# Patient Record
Sex: Male | Born: 1975 | Race: White | Hispanic: No | Marital: Married | Smoking: Former smoker
Health system: Southern US, Community
[De-identification: ages and names within clinical notes are randomized; demographics above are authoritative.]

## PROBLEM LIST (undated history)

## (undated) DIAGNOSIS — F419 Anxiety disorder, unspecified: Secondary | ICD-10-CM

## (undated) DIAGNOSIS — R739 Hyperglycemia, unspecified: Secondary | ICD-10-CM

## (undated) DIAGNOSIS — F319 Bipolar disorder, unspecified: Secondary | ICD-10-CM

## (undated) DIAGNOSIS — E559 Vitamin D deficiency, unspecified: Secondary | ICD-10-CM

## (undated) DIAGNOSIS — Z8619 Personal history of other infectious and parasitic diseases: Secondary | ICD-10-CM

## (undated) DIAGNOSIS — K219 Gastro-esophageal reflux disease without esophagitis: Secondary | ICD-10-CM

## (undated) DIAGNOSIS — E538 Deficiency of other specified B group vitamins: Secondary | ICD-10-CM

## (undated) DIAGNOSIS — D72820 Lymphocytosis (symptomatic): Secondary | ICD-10-CM

## (undated) DIAGNOSIS — R7989 Other specified abnormal findings of blood chemistry: Secondary | ICD-10-CM

## (undated) HISTORY — DX: Vitamin D deficiency, unspecified: E55.9

## (undated) HISTORY — DX: Anxiety disorder, unspecified: F41.9

## (undated) HISTORY — DX: Lymphocytosis (symptomatic): D72.820

## (undated) HISTORY — DX: Deficiency of other specified B group vitamins: E53.8

## (undated) HISTORY — DX: Gastro-esophageal reflux disease without esophagitis: K21.9

## (undated) HISTORY — PX: VASECTOMY: SHX75

## (undated) HISTORY — DX: Bipolar disorder, unspecified: F31.9

## (undated) HISTORY — DX: Hyperglycemia, unspecified: R73.9

## (undated) HISTORY — DX: Personal history of other infectious and parasitic diseases: Z86.19

## (undated) HISTORY — DX: Other specified abnormal findings of blood chemistry: R79.89

---

## 2008-08-24 ENCOUNTER — Emergency Department: Payer: Self-pay | Admitting: Emergency Medicine

## 2011-10-07 ENCOUNTER — Emergency Department: Payer: Self-pay | Admitting: Emergency Medicine

## 2015-06-02 ENCOUNTER — Encounter: Payer: Self-pay | Admitting: Family Medicine

## 2015-06-02 ENCOUNTER — Ambulatory Visit (INDEPENDENT_AMBULATORY_CARE_PROVIDER_SITE_OTHER): Payer: BLUE CROSS/BLUE SHIELD | Admitting: Family Medicine

## 2015-06-02 VITALS — BP 131/81 | HR 97 | Temp 98.6°F | Ht 70.2 in | Wt 232.0 lb

## 2015-06-02 DIAGNOSIS — E559 Vitamin D deficiency, unspecified: Secondary | ICD-10-CM | POA: Insufficient documentation

## 2015-06-02 DIAGNOSIS — M10071 Idiopathic gout, right ankle and foot: Secondary | ICD-10-CM | POA: Diagnosis not present

## 2015-06-02 DIAGNOSIS — M109 Gout, unspecified: Secondary | ICD-10-CM | POA: Insufficient documentation

## 2015-06-02 DIAGNOSIS — Z23 Encounter for immunization: Secondary | ICD-10-CM

## 2015-06-02 MED ORDER — ALLOPURINOL 300 MG PO TABS: 300.0000 mg | ORAL_TABLET | Freq: Every day | ORAL | Status: DC

## 2015-06-02 MED ORDER — COLCHICINE 0.6 MG PO TABS: 0.6000 mg | ORAL_TABLET | Freq: Every day | ORAL | Status: DC

## 2015-06-02 NOTE — Progress Notes (Signed)
BP 131/81 mmHg  Pulse 97  Temp(Src) 98.6 F (37 C)  Ht 5' 10.2" (1.783 m)  Wt 232 lb (105.235 kg)  BMI 33.10 kg/m2  SpO2 97%   Subjective:    Patient ID: Rodney Peters, male    DOB: 07-06-1975, 40 y.o.   MRN: [id number]  HPI: [name] is a 40 y.o. male  Chief Complaint  Patient presents with  . Foot Pain    right  . URI    x 10days   Patient with gout symptoms right great toe has been intermittently over the last several years now with intense pain very sensitive to very light touch red and inflamed Has been reading about gout has not taken any medication except for occasional Advil with practically no relief  Patient also with some head cold congestion started more as viral now stable and off lingering cough no real congestion drainage  Relevant past medical, surgical, family and social history reviewed and updated as indicated. Interim medical history since our last visit reviewed. Allergies and medications reviewed and updated.  Review of Systems  Constitutional: Negative.   Respiratory: Negative.   Cardiovascular: Negative.     Per HPI unless specifically indicated above     Objective:    BP 131/81 mmHg  Pulse 97  Temp(Src) 98.6 F (37 C)  Ht 5' 10.2" (1.783 m)  Wt 232 lb (105.235 kg)  BMI 33.10 kg/m2  SpO2 97%  Wt Readings from Last 3 Encounters:  06/02/15 232 lb (105.235 kg)  03/18/14 252 lb (114.306 kg)    Physical Exam  Constitutional: He is oriented to person, place, and time. He appears well-developed and well-nourished. No distress.  HENT:  Head: Normocephalic and atraumatic.  Right Ear: Hearing normal.  Left Ear: Hearing normal.  Nose: Nose normal.  Eyes: Conjunctivae and lids are normal. Right eye exhibits no discharge. Left eye exhibits no discharge. No scleral icterus.  Cardiovascular: Normal rate, regular rhythm and normal heart sounds.   Pulmonary/Chest: Effort normal and breath sounds normal. No respiratory distress.   Musculoskeletal: Normal range of motion.  Neurological: He is alert and oriented to person, place, and time.  Skin: Skin is intact. No rash noted.  Inflamed right red swollen bunion  Psychiatric: He has a normal mood and affect. His speech is normal and behavior is normal. Judgment and thought content normal. Cognition and memory are normal.    No results found for this or any previous visit.    Assessment & Plan:   Problem List Items Addressed This Visit      Other   Gout    Discuss gout care and treatment use of colchicine waiting 2 weeks starting allopurinol Discussed taking colchicine for 2 months allopurinol long-term Recheck uric acid and gout treatment at physical this spring      Relevant Medications   allopurinol (ZYLOPRIM) 300 MG tablet   colchicine 0.6 MG tablet   Other Relevant Orders   Uric acid   Basic metabolic panel   Vitamin D deficiency    Patient takes prescription replacement and is been on vitamins for over a year now wondering how his vitamin D is doing feels good otherwise      Relevant Orders   VITAMIN D 25 Hydroxy (Vit-D Deficiency, Fractures)    Other Visit Diagnoses    Immunization due    -  Primary    Relevant Orders    Flu Vaccine QUAD 36+ mos PF IM (Fluarix & Fluzone  Quad PF) (Completed)        Follow up plan: Return for Physical Exam this spring and check uric acid.

## 2015-06-02 NOTE — Assessment & Plan Note (Signed)
Discuss gout care and treatment use of colchicine waiting 2 weeks starting allopurinol Discussed taking colchicine for 2 months allopurinol long-term Recheck uric acid and gout treatment at physical this spring

## 2015-06-02 NOTE — Assessment & Plan Note (Signed)
Patient takes prescription replacement and is been on vitamins for over a year now wondering how his vitamin D is doing feels good otherwise

## 2015-06-03 LAB — BASIC METABOLIC PANEL
BUN/Creatinine Ratio: 11 (ref 8–19)
BUN: 10 mg/dL (ref 6–20)
CO2: 25 mmol/L (ref 18–29)
Calcium: 9.9 mg/dL (ref 8.7–10.2)
Chloride: 99 mmol/L (ref 96–106)
Creatinine, Ser: 0.89 mg/dL (ref 0.76–1.27)
GFR calc Af Amer: 125 mL/min/{1.73_m2} (ref >59)
GFR calc non Af Amer: 108 mL/min/{1.73_m2} (ref >59)
Glucose: 80 mg/dL (ref 65–99)
Potassium: 3.9 mmol/L (ref 3.5–5.2)
Sodium: 142 mmol/L (ref 134–144)

## 2015-06-03 LAB — URIC ACID: Uric Acid: 7.9 mg/dL (ref 3.7–8.6)

## 2015-06-03 LAB — VITAMIN D 25 HYDROXY (VIT D DEFICIENCY, FRACTURES): Vit D, 25-Hydroxy: 19.6 ng/mL — ABNORMAL LOW (ref 30.0–100.0)

## 2015-06-08 ENCOUNTER — Telehealth: Payer: Self-pay | Admitting: Family Medicine

## 2015-06-08 MED ORDER — AZITHROMYCIN 250 MG PO TABS: ORAL_TABLET | ORAL | Status: DC

## 2015-06-08 NOTE — Telephone Encounter (Signed)
Patient wife called stating that his cold and cough has not got better, per Dr. Jeananne Rama to call back if worse. Patient wants to know if he can get another medication or antibiotic for it. Pharmacy is [facility] in [place].

## 2015-07-31 ENCOUNTER — Encounter: Payer: Self-pay | Admitting: Unknown Physician Specialty

## 2015-07-31 ENCOUNTER — Ambulatory Visit (INDEPENDENT_AMBULATORY_CARE_PROVIDER_SITE_OTHER): Payer: BLUE CROSS/BLUE SHIELD | Admitting: Unknown Physician Specialty

## 2015-07-31 VITALS — BP 127/87 | HR 103 | Temp 98.5°F | Ht 70.5 in | Wt 233.6 lb

## 2015-07-31 DIAGNOSIS — J069 Acute upper respiratory infection, unspecified: Secondary | ICD-10-CM | POA: Diagnosis not present

## 2015-07-31 DIAGNOSIS — J029 Acute pharyngitis, unspecified: Secondary | ICD-10-CM

## 2015-07-31 NOTE — Patient Instructions (Signed)

## 2015-07-31 NOTE — Progress Notes (Signed)
BP 127/87 mmHg  Pulse 103  Temp(Src) 98.5 F (36.9 C)  Ht 5' 10.5" (1.791 m)  Wt 233 lb 9.6 oz (105.96 kg)  BMI 33.03 kg/m2  SpO2 98%   Subjective:    Patient ID: Rodney Peters, male    DOB: 1975-07-10, 40 y.o.   MRN: [id number]  HPI: Rodney Peters is a 40 y.o. male  Chief Complaint  Patient presents with  . Sore Throat    pt state he has white spots on back of throat  . URI    pt states he has had runny nose, headache, and congestion since yesterday   URI  This is a new problem. The current episode started in the past 7 days. The problem has been rapidly worsening. There has been no fever. Associated symptoms include congestion, rhinorrhea and a sore throat. Pertinent negatives include no abdominal pain, chest pain, coughing, diarrhea, dysuria, ear pain, headaches, joint pain, joint swelling, nausea, neck pain, plugged ear sensation, rash, sinus pain, sneezing, swollen glands, vomiting or wheezing. He has tried nothing for the symptoms.     Relevant past medical, surgical, family and social history reviewed and updated as indicated. Interim medical history since our last visit reviewed. Allergies and medications reviewed and updated.  Review of Systems  HENT: Positive for congestion, rhinorrhea and sore throat. Negative for ear pain and sneezing.   Respiratory: Negative for cough and wheezing.   Cardiovascular: Negative for chest pain.  Gastrointestinal: Negative for nausea, vomiting, abdominal pain and diarrhea.  Genitourinary: Negative for dysuria.  Musculoskeletal: Negative for joint pain and neck pain.  Skin: Negative for rash.  Neurological: Negative for headaches.    Per HPI unless specifically indicated above     Objective:    BP 127/87 mmHg  Pulse 103  Temp(Src) 98.5 F (36.9 C)  Ht 5' 10.5" (1.791 m)  Wt 233 lb 9.6 oz (105.96 kg)  BMI 33.03 kg/m2  SpO2 98%  Wt Readings from Last 3 Encounters:  07/31/15 233 lb 9.6 oz (105.96 kg)  06/02/15 232  lb (105.235 kg)  03/18/14 252 lb (114.306 kg)    Physical Exam  Results for orders placed or performed in visit on 06/02/15  Uric acid  Result Value Ref Range   Uric Acid 7.9 3.7 - 8.6 mg/dL  Basic metabolic panel  Result Value Ref Range   Glucose 80 65 - 99 mg/dL   BUN 10 6 - 20 mg/dL   Creatinine, Ser 0.89 0.76 - 1.27 mg/dL   GFR calc non Af Amer 108 >59 mL/min/1.73   GFR calc Af Amer 125 >59 mL/min/1.73   BUN/Creatinine Ratio 11 8 - 19   Sodium 142 134 - 144 mmol/L   Potassium 3.9 3.5 - 5.2 mmol/L   Chloride 99 96 - 106 mmol/L   CO2 25 18 - 29 mmol/L   Calcium 9.9 8.7 - 10.2 mg/dL  VITAMIN D 25 Hydroxy (Vit-D Deficiency, Fractures)  Result Value Ref Range   Vit D, 25-Hydroxy 19.6 (L) 30.0 - 100.0 ng/mL      Assessment & Plan:   Problem List Items Addressed This Visit    None    Visit Diagnoses    Sore throat    -  Primary    Relevant Orders    Rapid strep screen (not at [facility])    URI (upper respiratory infection)            Follow up plan: Return if symptoms worsen or fail  to improve.

## 2015-08-02 LAB — RAPID STREP SCREEN (MED CTR MEBANE ONLY): Strep Gp A Ag, IA W/Reflex: NEGATIVE

## 2015-08-02 LAB — CULTURE, GROUP A STREP: Strep A Culture: POSITIVE — AB

## 2015-08-03 ENCOUNTER — Telehealth: Payer: Self-pay | Admitting: Unknown Physician Specialty

## 2015-08-03 MED ORDER — AMOXICILLIN 875 MG PO TABS: 875.0000 mg | ORAL_TABLET | Freq: Two times a day (BID) | ORAL | Status: DC

## 2015-08-03 NOTE — Telephone Encounter (Signed)
Called pt to let him know throat culture is positive.  Called in Amoxicilin

## 2015-08-06 ENCOUNTER — Telehealth: Payer: Self-pay

## 2015-08-06 DIAGNOSIS — M10071 Idiopathic gout, right ankle and foot: Secondary | ICD-10-CM

## 2015-08-06 MED ORDER — ALLOPURINOL 300 MG PO TABS: 300.0000 mg | ORAL_TABLET | Freq: Every day | ORAL | Status: DC

## 2015-08-06 NOTE — Telephone Encounter (Signed)
Pharmacy sent a fax requesting 90 day rx for patient's allopurinol. We sent in #[phone] with 6 refills on 06/02/15.

## 2015-08-13 ENCOUNTER — Encounter: Payer: Self-pay | Admitting: Family Medicine

## 2015-08-13 ENCOUNTER — Ambulatory Visit (INDEPENDENT_AMBULATORY_CARE_PROVIDER_SITE_OTHER): Payer: BLUE CROSS/BLUE SHIELD | Admitting: Family Medicine

## 2015-08-13 VITALS — BP 126/82 | HR 92 | Temp 98.4°F | Ht 70.2 in | Wt 231.0 lb

## 2015-08-13 DIAGNOSIS — Z0000 Encounter for general adult medical examination without abnormal findings: Secondary | ICD-10-CM

## 2015-08-13 DIAGNOSIS — F3181 Bipolar II disorder: Secondary | ICD-10-CM

## 2015-08-13 DIAGNOSIS — G5790 Unspecified mononeuropathy of unspecified lower limb: Secondary | ICD-10-CM | POA: Insufficient documentation

## 2015-08-13 DIAGNOSIS — M10071 Idiopathic gout, right ankle and foot: Secondary | ICD-10-CM

## 2015-08-13 DIAGNOSIS — G5792 Unspecified mononeuropathy of left lower limb: Secondary | ICD-10-CM

## 2015-08-13 LAB — URINALYSIS, ROUTINE W REFLEX MICROSCOPIC
Bilirubin, UA: NEGATIVE
Glucose, UA: NEGATIVE
Ketones, UA: NEGATIVE
Leukocytes, UA: NEGATIVE
Nitrite, UA: NEGATIVE
Protein, UA: NEGATIVE
RBC, UA: NEGATIVE
Specific Gravity, UA: 1.02 (ref 1.005–1.030)
Urobilinogen, Ur: 0.2 mg/dL (ref 0.2–1.0)
pH, UA: 5 (ref 5.0–7.5)

## 2015-08-13 MED ORDER — ALLOPURINOL 300 MG PO TABS: 300.0000 mg | ORAL_TABLET | Freq: Every day | ORAL | Status: DC

## 2015-08-13 MED ORDER — LAMOTRIGINE 25 MG PO TABS: 25.0000 mg | ORAL_TABLET | Freq: Every day | ORAL | Status: DC

## 2015-08-13 MED ORDER — LAMOTRIGINE 200 MG PO TABS: 200.0000 mg | ORAL_TABLET | Freq: Every day | ORAL | Status: DC

## 2015-08-13 NOTE — Assessment & Plan Note (Signed)
The current medical regimen is effective;  continue present plan and medications.  
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
## 2015-08-13 NOTE — Addendum Note (Signed)
Addended by: Rowe Clack H on: 08/13/2015 11:41 AM   Modules accepted: Miquel Dunn

## 2015-08-13 NOTE — Assessment & Plan Note (Signed)
Restart lamictal reviewed long term nature of tx Discuss rash and slow increase of meds

## 2015-08-14 LAB — TSH: TSH: 1.76 u[IU]/mL (ref 0.450–4.500)

## 2015-08-14 LAB — CBC WITH DIFFERENTIAL/PLATELET
Basophils Absolute: 0.1 10*3/uL (ref 0.0–0.2)
Basos: 1 %
EOS (ABSOLUTE): 0.5 10*3/uL — ABNORMAL HIGH (ref 0.0–0.4)
Eos: 5 %
Hematocrit: 44.3 % (ref 37.5–51.0)
Hemoglobin: 15.3 g/dL (ref 12.6–17.7)
Immature Grans (Abs): 0 10*3/uL (ref 0.0–0.1)
Immature Granulocytes: 0 %
Lymphocytes Absolute: 4.9 10*3/uL — ABNORMAL HIGH (ref 0.7–3.1)
Lymphs: 44 %
MCH: 29.4 pg (ref 26.6–33.0)
MCHC: 34.5 g/dL (ref 31.5–35.7)
MCV: 85 fL (ref 79–97)
Monocytes Absolute: 0.4 10*3/uL (ref 0.1–0.9)
Monocytes: 4 %
Neutrophils Absolute: 5.2 10*3/uL (ref 1.4–7.0)
Neutrophils: 46 %
Platelets: 242 10*3/uL (ref 150–379)
RBC: 5.21 x10E6/uL (ref 4.14–5.80)
RDW: 13.2 % (ref 12.3–15.4)
WBC: 11.1 10*3/uL — ABNORMAL HIGH (ref 3.4–10.8)

## 2015-08-14 LAB — COMPREHENSIVE METABOLIC PANEL
ALT: 28 IU/L (ref 0–44)
AST: 24 IU/L (ref 0–40)
Albumin/Globulin Ratio: 1.7 (ref 1.2–2.2)
Albumin: 4.5 g/dL (ref 3.5–5.5)
Alkaline Phosphatase: 48 IU/L (ref 39–117)
BUN/Creatinine Ratio: 12 (ref 9–20)
BUN: 13 mg/dL (ref 6–20)
Bilirubin Total: 0.6 mg/dL (ref 0.0–1.2)
CO2: 23 mmol/L (ref 18–29)
Calcium: 9.6 mg/dL (ref 8.7–10.2)
Chloride: 97 mmol/L (ref 96–106)
Creatinine, Ser: 1.06 mg/dL (ref 0.76–1.27)
GFR calc Af Amer: 102 mL/min/{1.73_m2} (ref >59)
GFR calc non Af Amer: 88 mL/min/{1.73_m2} (ref >59)
Globulin, Total: 2.6 g/dL (ref 1.5–4.5)
Glucose: 97 mg/dL (ref 65–99)
Potassium: 3.9 mmol/L (ref 3.5–5.2)
Sodium: 141 mmol/L (ref 134–144)
Total Protein: 7.1 g/dL (ref 6.0–8.5)

## 2015-08-14 LAB — LIPID PANEL W/O CHOL/HDL RATIO
Cholesterol, Total: 204 mg/dL — ABNORMAL HIGH (ref 100–199)
HDL: 36 mg/dL — ABNORMAL LOW (ref >39)
LDL Calculated: 97 mg/dL (ref 0–99)
Triglycerides: 354 mg/dL — ABNORMAL HIGH (ref 0–149)
VLDL Cholesterol Cal: 71 mg/dL — ABNORMAL HIGH (ref 5–40)

## 2015-08-14 LAB — URIC ACID: Uric Acid: 8 mg/dL (ref 3.7–8.6)

## 2015-08-18 ENCOUNTER — Encounter: Payer: Self-pay | Admitting: Family Medicine

## 2015-11-11 ENCOUNTER — Encounter: Payer: Self-pay | Admitting: Family Medicine

## 2015-11-11 ENCOUNTER — Ambulatory Visit (INDEPENDENT_AMBULATORY_CARE_PROVIDER_SITE_OTHER): Payer: BLUE CROSS/BLUE SHIELD | Admitting: Family Medicine

## 2015-11-11 DIAGNOSIS — M10071 Idiopathic gout, right ankle and foot: Secondary | ICD-10-CM | POA: Diagnosis not present

## 2015-11-11 DIAGNOSIS — F3181 Bipolar II disorder: Secondary | ICD-10-CM | POA: Diagnosis not present

## 2015-11-11 NOTE — Progress Notes (Signed)
BP 121/78 (BP Location: Left Arm, Patient Position: Sitting, Cuff Size: Normal)   Pulse (!) 106   Temp 98.1 F (36.7 C)   Ht 5\' 11"  (1.803 m) Comment: with shoe  Wt 239 lb (108.4 kg) Comment: with shoes  SpO2 99%   BMI 33.33 kg/m    Subjective:    Patient ID: Rodney Peters, male    DOB: [birth year], 40 y.o.   MRN: [id number]  HPI: Rodney Peters is a 40 y.o. male  Chief Complaint  Patient presents with  . med check  Patient feeling somewhat better reports about 40% improved some of the peaks of been leveled out in some of the valleys have been filled in but still up and down. Patient also reveals having some CDs type symptoms has a lot of checking behaviors. Concerned about [place] whether to [place] or in a sink. Reviewed medication taking medication faithfully without problems Patient had tried stopping his colchicine and gout symptoms promptly recurred has been taking allopurinol without problems since May.  Relevant past medical, surgical, family and social history reviewed and updated as indicated. Interim medical history since our last visit reviewed. Allergies and medications reviewed and updated.  Review of Systems  Constitutional: Negative.   Respiratory: Negative.   Cardiovascular: Negative.     Per HPI unless specifically indicated above     Objective:    BP 121/78 (BP Location: Left Arm, Patient Position: Sitting, Cuff Size: Normal)   Pulse (!) 106   Temp 98.1 F (36.7 C)   Ht 5\' 11"  (1.803 m) Comment: with shoe  Wt 239 lb (108.4 kg) Comment: with shoes  SpO2 99%   BMI 33.33 kg/m   Wt Readings from Last 3 Encounters:  11/11/15 239 lb (108.4 kg)  08/13/15 231 lb (104.8 kg)  07/31/15 233 lb 9.6 oz (106 kg)    Physical Exam  Constitutional: He is oriented to person, place, and time. He appears well-developed and well-nourished. No distress.  HENT:  Head: Normocephalic and atraumatic.  Right Ear: Hearing normal.  Left Ear: Hearing normal.  Nose:  Nose normal.  Eyes: Conjunctivae and lids are normal. Right eye exhibits no discharge. Left eye exhibits no discharge. No scleral icterus.  Cardiovascular: Normal rate, regular rhythm and normal heart sounds.   Pulmonary/Chest: Effort normal and breath sounds normal. No respiratory distress.  Musculoskeletal: Normal range of motion.  Neurological: He is alert and oriented to person, place, and time.  Skin: Skin is intact. No rash noted.  Psychiatric: He has a normal mood and affect. His speech is normal and behavior is normal. Judgment and thought content normal. Cognition and memory are normal.    Results for orders placed or performed in visit on 08/13/15  CBC with Differential/Platelet  Result Value Ref Range   WBC 11.1 (H) 3.4 - 10.8 x10E3/uL   RBC 5.21 4.14 - 5.80 x10E6/uL   Hemoglobin 15.3 12.6 - 17.7 g/dL   Hematocrit 44.3 37.5 - 51.0 %   MCV 85 79 - 97 fL   MCH 29.4 26.6 - 33.0 pg   MCHC 34.5 31.5 - 35.7 g/dL   RDW 13.2 12.3 - 15.4 %   Platelets 242 150 - 379 x10E3/uL   Neutrophils 46 %   Lymphs 44 %   Monocytes 4 %   Eos 5 %   Basos 1 %   Neutrophils Absolute 5.2 1.4 - 7.0 x10E3/uL   Lymphocytes Absolute 4.9 (H) 0.7 - 3.1 x10E3/uL   Monocytes Absolute  0.4 0.1 - 0.9 x10E3/uL   EOS (ABSOLUTE) 0.5 (H) 0.0 - 0.4 x10E3/uL   Basophils Absolute 0.1 0.0 - 0.2 x10E3/uL   Immature Granulocytes 0 %   Immature Grans (Abs) 0.0 0.0 - 0.1 x10E3/uL  Comprehensive metabolic panel  Result Value Ref Range   Glucose 97 65 - 99 mg/dL   BUN 13 6 - 20 mg/dL   Creatinine, Ser 1.06 0.76 - 1.27 mg/dL   GFR calc non Af Amer 88 >59 mL/min/1.73   GFR calc Af Amer 102 >59 mL/min/1.73   BUN/Creatinine Ratio 12 9 - 20   Sodium 141 134 - 144 mmol/L   Potassium 3.9 3.5 - 5.2 mmol/L   Chloride 97 96 - 106 mmol/L   CO2 23 18 - 29 mmol/L   Calcium 9.6 8.7 - 10.2 mg/dL   Total Protein 7.1 6.0 - 8.5 g/dL   Albumin 4.5 3.5 - 5.5 g/dL   Globulin, Total 2.6 1.5 - 4.5 g/dL   Albumin/Globulin Ratio  1.7 1.2 - 2.2   Bilirubin Total 0.6 0.0 - 1.2 mg/dL   Alkaline Phosphatase 48 39 - 117 IU/L   AST 24 0 - 40 IU/L   ALT 28 0 - 44 IU/L  Lipid Panel w/o Chol/HDL Ratio  Result Value Ref Range   Cholesterol, Total 204 (H) 100 - 199 mg/dL   Triglycerides 354 (H) 0 - 149 mg/dL   HDL 36 (L) >39 mg/dL   VLDL Cholesterol Cal 71 (H) 5 - 40 mg/dL   LDL Calculated 97 0 - 99 mg/dL  TSH  Result Value Ref Range   TSH 1.760 0.450 - 4.500 uIU/mL  Urinalysis, Routine w reflex microscopic (not at [facility])  Result Value Ref Range   Specific Gravity, UA 1.020 1.005 - 1.030   pH, UA 5.0 5.0 - 7.5   Color, UA Yellow Yellow   Appearance Ur Clear Clear   Leukocytes, UA Negative Negative   Protein, UA Negative Negative/Trace   Glucose, UA Negative Negative   Ketones, UA Negative Negative   RBC, UA Negative Negative   Bilirubin, UA Negative Negative   Urobilinogen, Ur 0.2 0.2 - 1.0 mg/dL   Nitrite, UA Negative Negative  Uric acid  Result Value Ref Range   Uric Acid 8.0 3.7 - 8.6 mg/dL      Assessment & Plan:   Problem List Items Addressed This Visit      Other   Gout    Will check uric acid today may need stronger agent and allopurinol will consider Uloric pending results      Relevant Orders   Uric acid   Basic metabolic panel   Bipolar 2 disorder (HCC)    Stabilizing on Lamictal 200 mg but still having symptoms and needing improvement will refer to psychiatry especially with developing more prominent OCD symptoms       Other Visit Diagnoses   None.      Follow up plan: Return in about 3 months (around 02/11/2016), or if symptoms worsen or fail to improve, for Med check.

## 2015-11-11 NOTE — Assessment & Plan Note (Signed)
Stabilizing on Lamictal 200 mg but still having symptoms and needing improvement will refer to psychiatry especially with developing more prominent OCD symptoms

## 2015-11-11 NOTE — Assessment & Plan Note (Signed)
Will check uric acid today may need stronger agent and allopurinol will consider Uloric pending results

## 2015-11-12 ENCOUNTER — Encounter: Payer: Self-pay | Admitting: Family Medicine

## 2015-11-12 LAB — BASIC METABOLIC PANEL
BUN/Creatinine Ratio: 12 (ref 9–20)
BUN: 11 mg/dL (ref 6–20)
CO2: 26 mmol/L (ref 18–29)
Calcium: 9.6 mg/dL (ref 8.7–10.2)
Chloride: 97 mmol/L (ref 96–106)
Creatinine, Ser: 0.95 mg/dL (ref 0.76–1.27)
GFR calc Af Amer: 116 mL/min/{1.73_m2} (ref >59)
GFR calc non Af Amer: 100 mL/min/{1.73_m2} (ref >59)
Glucose: 111 mg/dL — ABNORMAL HIGH (ref 65–99)
Potassium: 4.1 mmol/L (ref 3.5–5.2)
Sodium: 140 mmol/L (ref 134–144)

## 2015-11-12 LAB — URIC ACID: Uric Acid: 6.6 mg/dL (ref 3.7–8.6)

## 2016-02-09 ENCOUNTER — Ambulatory Visit (INDEPENDENT_AMBULATORY_CARE_PROVIDER_SITE_OTHER): Payer: BLUE CROSS/BLUE SHIELD | Admitting: Family Medicine

## 2016-02-09 ENCOUNTER — Encounter: Payer: Self-pay | Admitting: Family Medicine

## 2016-02-09 VITALS — BP 106/74 | HR 96 | Temp 98.2°F | Wt 244.0 lb

## 2016-02-09 DIAGNOSIS — F422 Mixed obsessional thoughts and acts: Secondary | ICD-10-CM | POA: Diagnosis not present

## 2016-02-09 DIAGNOSIS — M10071 Idiopathic gout, right ankle and foot: Secondary | ICD-10-CM | POA: Diagnosis not present

## 2016-02-09 DIAGNOSIS — Z23 Encounter for immunization: Secondary | ICD-10-CM | POA: Diagnosis not present

## 2016-02-09 DIAGNOSIS — F3181 Bipolar II disorder: Secondary | ICD-10-CM | POA: Diagnosis not present

## 2016-02-09 DIAGNOSIS — F429 Obsessive-compulsive disorder, unspecified: Secondary | ICD-10-CM | POA: Insufficient documentation

## 2016-02-09 NOTE — Assessment & Plan Note (Signed)
The current medical regimen is effective;  continue present plan and medications.  

## 2016-02-09 NOTE — Assessment & Plan Note (Signed)
Discussed OCD and to consider consult and or meds Pt not ready

## 2016-02-09 NOTE — Progress Notes (Signed)
BP 106/74   Pulse 96   Temp 98.2 F (36.8 C)   Wt 244 lb (110.7 kg)   SpO2 96%   BMI 34.03 kg/m    Subjective:    Patient ID: Rodney Peters, male    DOB: Mar 19, 1976, 40 y.o.   MRN: [id number]  HPI: Rodney Peters is a 40 y.o. male  Chief Complaint  Patient presents with  . Gout    follow up  . Manic Behavior    follow up on Lamictal    Relevant past medical, surgical, family and social history reviewed and updated as indicated. Interim medical history since our last visit reviewed. Allergies and medications reviewed and updated.  Review of Systems  Constitutional: Negative.   Respiratory: Negative.   Cardiovascular: Negative.     Per HPI unless specifically indicated above     Objective:    BP 106/74   Pulse 96   Temp 98.2 F (36.8 C)   Wt 244 lb (110.7 kg)   SpO2 96%   BMI 34.03 kg/m   Wt Readings from Last 3 Encounters:  02/09/16 244 lb (110.7 kg)  11/11/15 239 lb (108.4 kg)  08/13/15 231 lb (104.8 kg)    Physical Exam  Constitutional: He is oriented to person, place, and time. He appears well-developed and well-nourished. No distress.  HENT:  Head: Normocephalic and atraumatic.  Right Ear: Hearing normal.  Left Ear: Hearing normal.  Nose: Nose normal.  Eyes: Conjunctivae and lids are normal. Right eye exhibits no discharge. Left eye exhibits no discharge. No scleral icterus.  Pulmonary/Chest: Effort normal. No respiratory distress.  Musculoskeletal: Normal range of motion.  Neurological: He is alert and oriented to person, place, and time.  Skin: Skin is intact. No rash noted.  Psychiatric: He has a normal mood and affect. His speech is normal and behavior is normal. Judgment and thought content normal. Cognition and memory are normal.    Results for orders placed or performed in visit on 11/11/15  Uric acid  Result Value Ref Range   Uric Acid 6.6 3.7 - 8.6 mg/dL  Basic metabolic panel  Result Value Ref Range   Glucose 111 (H) 65 - 99  mg/dL   BUN 11 6 - 20 mg/dL   Creatinine, Ser 0.95 0.76 - 1.27 mg/dL   GFR calc non Af Amer 100 >59 mL/min/1.73   GFR calc Af Amer 116 >59 mL/min/1.73   BUN/Creatinine Ratio 12 9 - 20   Sodium 140 134 - 144 mmol/L   Potassium 4.1 3.5 - 5.2 mmol/L   Chloride 97 96 - 106 mmol/L   CO2 26 18 - 29 mmol/L   Calcium 9.6 8.7 - 10.2 mg/dL      Assessment & Plan:   Problem List Items Addressed This Visit      Other   Gout    The current medical regimen is effective;  continue present plan and medications.       Bipolar 2 disorder ([place])    The current medical regimen is effective;  continue present plan and medications.       OCD (obsessive compulsive disorder)    Discussed OCD and to consider consult and or meds Pt not ready       Other Visit Diagnoses    Needs flu shot    -  Primary   Encounter for immunization       Relevant Orders   Flu Vaccine QUAD 36+ mos IM (Completed)  Follow up plan: Return in about 6 months (around 08/08/2016) for Physical Exam uric acid.

## 2016-03-12 ENCOUNTER — Emergency Department
Admission: EM | Admit: 2016-03-12 | Discharge: 2016-03-12 | Disposition: A | Discharge status: Home / Self Care | Payer: BLUE CROSS/BLUE SHIELD | Attending: Emergency Medicine | Admitting: Emergency Medicine

## 2016-03-12 ENCOUNTER — Encounter: Payer: Self-pay | Admitting: Emergency Medicine

## 2016-03-12 ENCOUNTER — Emergency Department: Payer: BLUE CROSS/BLUE SHIELD

## 2016-03-12 DIAGNOSIS — S62339A Displaced fracture of neck of unspecified metacarpal bone, initial encounter for closed fracture: Secondary | ICD-10-CM

## 2016-03-12 DIAGNOSIS — S6991XA Unspecified injury of right wrist, hand and finger(s), initial encounter: Secondary | ICD-10-CM | POA: Diagnosis present

## 2016-03-12 DIAGNOSIS — Y9389 Activity, other specified: Secondary | ICD-10-CM | POA: Insufficient documentation

## 2016-03-12 DIAGNOSIS — Z79899 Other long term (current) drug therapy: Secondary | ICD-10-CM | POA: Diagnosis not present

## 2016-03-12 DIAGNOSIS — Y929 Unspecified place or not applicable: Secondary | ICD-10-CM | POA: Diagnosis not present

## 2016-03-12 DIAGNOSIS — S62306A Unspecified fracture of fifth metacarpal bone, right hand, initial encounter for closed fracture: Secondary | ICD-10-CM | POA: Diagnosis not present

## 2016-03-12 DIAGNOSIS — Y999 Unspecified external cause status: Secondary | ICD-10-CM | POA: Insufficient documentation

## 2016-03-12 DIAGNOSIS — W2201XA Walked into wall, initial encounter: Secondary | ICD-10-CM | POA: Insufficient documentation

## 2016-03-12 DIAGNOSIS — Z87891 Personal history of nicotine dependence: Secondary | ICD-10-CM | POA: Insufficient documentation

## 2016-03-12 NOTE — ED Notes (Signed)
Pt declines splint.

## 2016-03-12 NOTE — ED Provider Notes (Addendum)
[facility] Emergency Department Provider Note        Time seen: ----------------------------------------- 4:14 AM on 03/12/2016 -----------------------------------------    I have reviewed the triage vital signs and the nursing notes.   HISTORY  Chief Complaint Hand Pain    HPI Rodney Peters is a 40 y.o. male who presents to ER after he punched a wall earlier in the evening. He presents with right hand pain. Pain with range of motion of the right hand, otherwise he denies complaints currently.   Past Medical History:  Diagnosis Date  . Abnormal TSH   . Bipolar disorder ([place])   . GERD (gastroesophageal reflux disease)   . History of mononucleosis   . Hyperglycemia   . Lymphocytosis   . Vitamin B 12 deficiency   . Vitamin D deficiency     Patient Active Problem List   Diagnosis Date Noted  . OCD (obsessive compulsive disorder) 02/09/2016  . Bipolar 2 disorder ([place]) 08/13/2015  . Neuropathy, leg 08/13/2015  . Gout 06/02/2015  . Vitamin D deficiency 06/02/2015    History reviewed. No pertinent surgical history.  Allergies Patient has no known allergies.  Social History Social History  Substance Use Topics  . Smoking status: Former Smoker    Quit date: 12/19/2012  . Smokeless tobacco: Former Systems developer  . Alcohol use Yes     Comment: weekly    Review of Systems Constitutional: Negative for fever. Musculoskeletal: Positive for right hand pain Skin: Positive for contusion Neurological: Negative for headaches, focal weakness or numbness.  10-point ROS otherwise negative.  ____________________________________________   PHYSICAL EXAM:  VITAL SIGNS: ED Triage Vitals  Enc Vitals Group     BP 03/12/16 0144 135/76     Pulse Rate 03/12/16 0144 98     Resp 03/12/16 0144 17     Temp 03/12/16 0144 97.6 F (36.4 C)     Temp Source 03/12/16 0144 Oral     SpO2 03/12/16 0144 100 %     Weight 03/12/16 0036 240 lb (108.9 kg)     Height  03/12/16 0036 5\' 10"  (1.778 m)     Head Circumference --      Peak Flow --      Pain Score 03/12/16 0036 4     Pain Loc --      Pain Edu? --      Excl. in [place]? --     Constitutional: Alert and oriented. Well appearing and in no distress. Eyes: Conjunctivae are normal. PERRL. Normal extraocular movements. Musculoskeletal: Pain with range of motion the right hand, pain and swelling noted over the right dorsum medially. There is some ecchymosis noted medially on the palmar surface. Neurologic:  Normal speech and language. No gross focal neurologic deficits are appreciated.  Skin:  Small abrasion is noted over the fourth MCP joint Psychiatric: Mood and affect are normal. Speech and behavior are normal.  ____________________________________________  ED COURSE:  Pertinent labs & imaging results that were available during my care of the patient were reviewed by me and considered in my medical decision making (see chart for details). Clinical Course   Patient presents to the ER after likely boxer's fracture. We will assess with imaging.  Procedures ____________________________________________   RADIOLOGY Images were viewed by me   IMPRESSION: Mildly angulated fifth metacarpal head fracture.   ____________________________________________  FINAL ASSESSMENT AND PLAN  Boxer's fracture  Plan: Patient with  imaging as dictated above. Patient is in no distress, he has declined  splinting at this time. He has a mildly angulated fracture that will likely heal properly without any treatment. He does not do any strenuous activities. He has declined pain medication. I will refer to orthopedics for follow-up.   Earleen Newport, MD   Note: This dictation was prepared with Dragon dictation. Any transcriptional errors that result from this process are unintentional    Earleen Newport, MD 03/12/16 [id number]    Earleen Newport, MD 03/12/16 [phone]

## 2016-03-12 NOTE — ED Triage Notes (Signed)
Reports hit a wall, now with right hand pain.

## 2016-03-12 NOTE — ED Notes (Signed)
Pt with pain and swelling noted to right hand. Pt with swelling noted to posterior right hand, with most swelling noted to base of 5th-2nd fingers. Pt is able to flex all fingers slightly. Pt with cms intact to all fingers. Pt with small abrasion with controlled bleeding approx 0.25 cm noted to base of posterior 3rd right finger. Explanation of ice application provided to pt who verbalizes understanding.

## 2016-04-30 ENCOUNTER — Other Ambulatory Visit: Payer: Self-pay | Admitting: Family Medicine

## 2016-04-30 DIAGNOSIS — M10071 Idiopathic gout, right ankle and foot: Secondary | ICD-10-CM

## 2016-08-16 ENCOUNTER — Ambulatory Visit (INDEPENDENT_AMBULATORY_CARE_PROVIDER_SITE_OTHER): Payer: BLUE CROSS/BLUE SHIELD | Admitting: Family Medicine

## 2016-08-16 ENCOUNTER — Encounter: Payer: Self-pay | Admitting: Family Medicine

## 2016-08-16 VITALS — BP 133/85 | HR 88 | Ht 71.0 in | Wt 244.0 lb

## 2016-08-16 DIAGNOSIS — Z13220 Encounter for screening for lipoid disorders: Secondary | ICD-10-CM | POA: Diagnosis not present

## 2016-08-16 DIAGNOSIS — E559 Vitamin D deficiency, unspecified: Secondary | ICD-10-CM

## 2016-08-16 DIAGNOSIS — F3181 Bipolar II disorder: Secondary | ICD-10-CM | POA: Diagnosis not present

## 2016-08-16 DIAGNOSIS — Z1329 Encounter for screening for other suspected endocrine disorder: Secondary | ICD-10-CM

## 2016-08-16 DIAGNOSIS — Z125 Encounter for screening for malignant neoplasm of prostate: Secondary | ICD-10-CM | POA: Diagnosis not present

## 2016-08-16 DIAGNOSIS — Z79899 Other long term (current) drug therapy: Secondary | ICD-10-CM

## 2016-08-16 DIAGNOSIS — Z0000 Encounter for general adult medical examination without abnormal findings: Secondary | ICD-10-CM | POA: Diagnosis not present

## 2016-08-16 DIAGNOSIS — M10071 Idiopathic gout, right ankle and foot: Secondary | ICD-10-CM | POA: Diagnosis not present

## 2016-08-16 LAB — URINALYSIS, ROUTINE W REFLEX MICROSCOPIC
Bilirubin, UA: NEGATIVE
Glucose, UA: NEGATIVE
Ketones, UA: NEGATIVE
Leukocytes, UA: NEGATIVE
Nitrite, UA: NEGATIVE
Protein, UA: NEGATIVE
RBC, UA: NEGATIVE
Specific Gravity, UA: 1.015 (ref 1.005–1.030)
Urobilinogen, Ur: 0.2 mg/dL (ref 0.2–1.0)
pH, UA: 5 (ref 5.0–7.5)

## 2016-08-16 LAB — MICROSCOPIC EXAMINATION

## 2016-08-16 MED ORDER — LAMOTRIGINE 200 MG PO TABS: 200.0000 mg | ORAL_TABLET | Freq: Every day | ORAL | 4 refills | Status: DC

## 2016-08-16 MED ORDER — COLCHICINE 0.6 MG PO TABS: 0.6000 mg | ORAL_TABLET | Freq: Every day | ORAL | 4 refills | Status: DC

## 2016-08-16 MED ORDER — ALLOPURINOL 300 MG PO TABS: 300.0000 mg | ORAL_TABLET | Freq: Every day | ORAL | 4 refills | Status: DC

## 2016-08-16 NOTE — Assessment & Plan Note (Signed)
The current medical regimen is effective;  continue present plan and medications.  

## 2016-08-16 NOTE — Progress Notes (Signed)
BP 133/85   Pulse 88   Ht 5\' 11"  (1.803 m)   Wt 244 lb (110.7 kg)   SpO2 98%   BMI 34.03 kg/m    Subjective:    Patient ID: Rodney Peters, male    DOB: 1976/02/17, 41 y.o.   MRN: [id number]  HPI: [name] is a 41 y.o. male  Chief Complaint  Patient presents with  . Annual Exam  Patient doing well with no gout signs or symptoms takes allopurinol without problems. Takes Lamictal without problems and wife says things are going good. discussed other concerns and will be making apt with psy  Relevant past medical, surgical, family and social history reviewed and updated as indicated. Interim medical history since our last visit reviewed. Allergies and medications reviewed and updated.  Review of Systems  Constitutional: Negative.   HENT: Negative.   Eyes: Negative.   Respiratory: Negative.   Cardiovascular: Negative.   Gastrointestinal: Negative.   Endocrine: Negative.   Genitourinary: Negative.   Musculoskeletal: Negative.   Skin: Negative.   Allergic/Immunologic: Negative.   Neurological: Negative.   Hematological: Negative.   Psychiatric/Behavioral: Negative.     Per HPI unless specifically indicated above     Objective:    BP 133/85   Pulse 88   Ht 5\' 11"  (1.803 m)   Wt 244 lb (110.7 kg)   SpO2 98%   BMI 34.03 kg/m   Wt Readings from Last 3 Encounters:  08/16/16 244 lb (110.7 kg)  03/12/16 240 lb (108.9 kg)  02/09/16 244 lb (110.7 kg)    Physical Exam  Constitutional: He is oriented to person, place, and time. He appears well-developed and well-nourished.  HENT:  Head: Normocephalic and atraumatic.  Right Ear: External ear normal.  Left Ear: External ear normal.  Eyes: Conjunctivae and EOM are normal. Pupils are equal, round, and reactive to light.  Neck: Normal range of motion. Neck supple.  Cardiovascular: Normal rate, regular rhythm, normal heart sounds and intact distal pulses.   Pulmonary/Chest: Effort normal and breath sounds normal.   Abdominal: Soft. Bowel sounds are normal. There is no splenomegaly or hepatomegaly.  Genitourinary: Rectum normal, prostate normal and penis normal.  Musculoskeletal: Normal range of motion.  Neurological: He is alert and oriented to person, place, and time. He has normal reflexes.  Skin: No rash noted. No erythema.  Psychiatric: He has a normal mood and affect. His behavior is normal. Judgment and thought content normal.    Results for orders placed or performed in visit on 11/11/15  Uric acid  Result Value Ref Range   Uric Acid 6.6 3.7 - 8.6 mg/dL  Basic metabolic panel  Result Value Ref Range   Glucose 111 (H) 65 - 99 mg/dL   BUN 11 6 - 20 mg/dL   Creatinine, Ser 0.95 0.76 - 1.27 mg/dL   GFR calc non Af Amer 100 >59 mL/min/1.73   GFR calc Af Amer 116 >59 mL/min/1.73   BUN/Creatinine Ratio 12 9 - 20   Sodium 140 134 - 144 mmol/L   Potassium 4.1 3.5 - 5.2 mmol/L   Chloride 97 96 - 106 mmol/L   CO2 26 18 - 29 mmol/L   Calcium 9.6 8.7 - 10.2 mg/dL      Assessment & Plan:   Problem List Items Addressed This Visit      Other   Gout    The current medical regimen is effective;  continue present plan and medications.  Relevant Medications   allopurinol (ZYLOPRIM) 300 MG tablet   colchicine 0.6 MG tablet   Other Relevant Orders   Uric acid   Vitamin D deficiency   Relevant Orders   Vitamin D (25 hydroxy)   Bipolar 2 disorder ([place])    The current medical regimen is effective;  continue present plan and medications.       Relevant Medications   lamoTRIgine (LAMICTAL) 200 MG tablet    Other Visit Diagnoses    Routine general medical examination at a health care facility    -  Primary   Relevant Orders   Uric acid   CBC with Differential/Platelet   Comprehensive metabolic panel   Lipid panel   TSH   Urinalysis, Routine w reflex microscopic   Medication management       Relevant Orders   Comprehensive metabolic panel   Screening cholesterol level        Relevant Orders   Lipid panel   Prostate cancer screening       Thyroid disorder screen       Relevant Orders   TSH       Follow up plan: Return in about 6 months (around 02/16/2017).

## 2016-08-17 ENCOUNTER — Encounter: Payer: Self-pay | Admitting: Family Medicine

## 2016-08-17 LAB — PSA: Prostate Specific Ag, Serum: 0.4 ng/mL (ref 0.0–4.0)

## 2016-08-18 LAB — CBC WITH DIFFERENTIAL/PLATELET
Basophils Absolute: 0 10*3/uL (ref 0.0–0.2)
Basos: 0 %
EOS (ABSOLUTE): 0.4 10*3/uL (ref 0.0–0.4)
Eos: 5 %
Hematocrit: 45.6 % (ref 37.5–51.0)
Hemoglobin: 15.7 g/dL (ref 13.0–17.7)
Immature Grans (Abs): 0 10*3/uL (ref 0.0–0.1)
Immature Granulocytes: 0 %
Lymphocytes Absolute: 3.6 10*3/uL — ABNORMAL HIGH (ref 0.7–3.1)
Lymphs: 40 %
MCH: 29.1 pg (ref 26.6–33.0)
MCHC: 34.4 g/dL (ref 31.5–35.7)
MCV: 84 fL (ref 79–97)
Monocytes Absolute: 0.7 10*3/uL (ref 0.1–0.9)
Monocytes: 7 %
Neutrophils Absolute: 4.3 10*3/uL (ref 1.4–7.0)
Neutrophils: 48 %
Platelets: 220 10*3/uL (ref 150–379)
RBC: 5.4 x10E6/uL (ref 4.14–5.80)
RDW: 14.1 % (ref 12.3–15.4)
WBC: 9 10*3/uL (ref 3.4–10.8)

## 2016-08-18 LAB — COMPREHENSIVE METABOLIC PANEL
ALT: 25 IU/L (ref 0–44)
AST: 20 IU/L (ref 0–40)
Albumin/Globulin Ratio: 2.1 (ref 1.2–2.2)
Albumin: 4.4 g/dL (ref 3.5–5.5)
Alkaline Phosphatase: 51 IU/L (ref 39–117)
BUN/Creatinine Ratio: 16 (ref 9–20)
BUN: 18 mg/dL (ref 6–24)
Bilirubin Total: 0.4 mg/dL (ref 0.0–1.2)
CO2: 24 mmol/L (ref 18–29)
Calcium: 8.9 mg/dL (ref 8.7–10.2)
Chloride: 99 mmol/L (ref 96–106)
Creatinine, Ser: 1.14 mg/dL (ref 0.76–1.27)
GFR calc Af Amer: 92 mL/min/{1.73_m2} (ref >59)
GFR calc non Af Amer: 80 mL/min/{1.73_m2} (ref >59)
Globulin, Total: 2.1 g/dL (ref 1.5–4.5)
Glucose: 94 mg/dL (ref 65–99)
Potassium: 3.8 mmol/L (ref 3.5–5.2)
Sodium: 139 mmol/L (ref 134–144)
Total Protein: 6.5 g/dL (ref 6.0–8.5)

## 2016-08-18 LAB — LIPID PANEL
Chol/HDL Ratio: 7.4 ratio — ABNORMAL HIGH (ref 0.0–5.0)
Cholesterol, Total: 215 mg/dL — ABNORMAL HIGH (ref 100–199)
HDL: 29 mg/dL — ABNORMAL LOW (ref >39)
Triglycerides: 645 mg/dL (ref 0–149)

## 2016-08-18 LAB — VITAMIN D 25 HYDROXY (VIT D DEFICIENCY, FRACTURES): Vit D, 25-Hydroxy: 23.8 ng/mL — ABNORMAL LOW (ref 30.0–100.0)

## 2016-08-18 LAB — URIC ACID: Uric Acid: 7.4 mg/dL (ref 3.7–8.6)

## 2016-08-18 LAB — TSH: TSH: 3.23 u[IU]/mL (ref 0.450–4.500)

## 2016-08-24 ENCOUNTER — Telehealth: Payer: Self-pay | Admitting: Family Medicine

## 2016-08-24 ENCOUNTER — Encounter: Payer: Self-pay | Admitting: Family Medicine

## 2016-08-24 NOTE — Telephone Encounter (Signed)
Phone call Discussed with patient vitamin D still low discussed only taking 1000 international units a day we'll increase to 5000 international units a day and recheck vitamin D next year Patient uric acid elevated taking allopurinol 300 every day we will continue having no gout symptoms. Discuss elevated triglycerides patient will do better with diet weight loss.

## 2016-09-10 ENCOUNTER — Other Ambulatory Visit: Payer: Self-pay | Admitting: Family Medicine

## 2016-09-10 DIAGNOSIS — F3181 Bipolar II disorder: Secondary | ICD-10-CM

## 2016-09-20 ENCOUNTER — Telehealth: Payer: Self-pay | Admitting: Family Medicine

## 2016-09-20 MED ORDER — NEOMYCIN-POLYMYXIN-HC 3.5-10000-1 OT SOLN: 4.0000 [drp] | Freq: Four times a day (QID) | OTIC | 0 refills | Status: DC

## 2016-09-20 NOTE — Telephone Encounter (Signed)
Patients wife states patients ear canal is swollen and sore.  She is hoping that Dr Jeananne Rama would call him in something for this.  She states they have a swimming pool and she feels he may have picked up some type of bacteria from the pool as she has had the same thing just a few weeks ago.  Thanks   [facility] pts wife [phone]

## 2016-09-20 NOTE — Telephone Encounter (Signed)
Prescription for swimmers ear sent to drugstore

## 2016-09-20 NOTE — Telephone Encounter (Signed)
Message relayed to patient. Verbalized understanding and denied questions.   

## 2016-09-20 NOTE — Telephone Encounter (Signed)
Please advise 

## 2017-02-23 ENCOUNTER — Ambulatory Visit: Payer: BLUE CROSS/BLUE SHIELD | Admitting: Family Medicine

## 2017-05-03 ENCOUNTER — Ambulatory Visit: Payer: BLUE CROSS/BLUE SHIELD | Admitting: Family Medicine

## 2017-08-19 DIAGNOSIS — S46911A Strain of unspecified muscle, fascia and tendon at shoulder and upper arm level, right arm, initial encounter: Secondary | ICD-10-CM | POA: Insufficient documentation

## 2017-10-01 ENCOUNTER — Other Ambulatory Visit: Payer: Self-pay | Admitting: Family Medicine

## 2017-10-01 DIAGNOSIS — M10071 Idiopathic gout, right ankle and foot: Secondary | ICD-10-CM

## 2017-10-27 ENCOUNTER — Other Ambulatory Visit: Payer: Self-pay | Admitting: Family Medicine

## 2017-10-27 DIAGNOSIS — M10071 Idiopathic gout, right ankle and foot: Secondary | ICD-10-CM

## 2017-11-05 ENCOUNTER — Other Ambulatory Visit: Payer: Self-pay | Admitting: Family Medicine

## 2017-11-05 DIAGNOSIS — M10071 Idiopathic gout, right ankle and foot: Secondary | ICD-10-CM

## 2017-11-06 NOTE — Telephone Encounter (Signed)
Allopurinol refill Last Refill:10/02/17 # 30/0 refill Last OV:08/16/16; Upcoming 01/15/18 PCP: [place]: [id number] [place], [address] [phone] (Phone) [phone] (Fax)

## 2017-11-07 MED ORDER — ALLOPURINOL 300 MG PO TABS: 300.0000 mg | ORAL_TABLET | Freq: Every day | ORAL | 1 refills | Status: DC

## 2017-11-07 NOTE — Addendum Note (Signed)
Addended by: Merrie Roof E on: 11/07/2017 11:31 AM   Modules accepted: Orders

## 2017-11-07 NOTE — Telephone Encounter (Signed)
Refills sent to bridge until OV scheduled in Oct.

## 2017-11-07 NOTE — Telephone Encounter (Signed)
Pt has an appt for 01/15/18.

## 2017-12-05 ENCOUNTER — Other Ambulatory Visit: Payer: Self-pay | Admitting: Family Medicine

## 2017-12-05 DIAGNOSIS — M10071 Idiopathic gout, right ankle and foot: Secondary | ICD-10-CM

## 2018-01-12 ENCOUNTER — Other Ambulatory Visit: Payer: Self-pay

## 2018-01-12 MED ORDER — FLUVOXAMINE MALEATE 50 MG PO TABS: 100.0000 mg | ORAL_TABLET | Freq: Every day | ORAL | 0 refills | Status: DC

## 2018-01-15 ENCOUNTER — Ambulatory Visit: Payer: Self-pay | Admitting: Family Medicine

## 2018-01-26 DIAGNOSIS — F312 Bipolar disorder, current episode manic severe with psychotic features: Secondary | ICD-10-CM | POA: Insufficient documentation

## 2018-01-29 ENCOUNTER — Encounter: Payer: Self-pay | Admitting: Psychiatry

## 2018-01-29 ENCOUNTER — Ambulatory Visit: Payer: BLUE CROSS/BLUE SHIELD | Admitting: Psychiatry

## 2018-01-29 DIAGNOSIS — F422 Mixed obsessional thoughts and acts: Secondary | ICD-10-CM | POA: Diagnosis not present

## 2018-01-29 DIAGNOSIS — F411 Generalized anxiety disorder: Secondary | ICD-10-CM | POA: Diagnosis not present

## 2018-01-29 DIAGNOSIS — F3181 Bipolar II disorder: Secondary | ICD-10-CM

## 2018-01-29 MED ORDER — FLUVOXAMINE MALEATE 100 MG PO TABS: 100.0000 mg | ORAL_TABLET | Freq: Every day | ORAL | 0 refills | Status: DC

## 2018-01-29 NOTE — Progress Notes (Signed)
Rodney Peters [id number] 08-03-1975 41 y.o.  Subjective:   Patient ID:  Rodney Peters is a 42 y.o. (DOB Sep 06, 1975) male.  Chief Complaint:  Chief Complaint  Patient presents with  . Anxiety  . Follow-up    bipolar and OCD    HPI Rodney Peters presents to the office today for follow-up of above and change in meds for anxiety. Feels overall a lot better, much calmer.  Benefit from Luvox added last visit(on 100mg  for 3-4 weeks), despite external stressors being tough.  Has seen some improvement is OCD.  Less rewriting.  Less anxiety around it.  Patient reports stable mood and denies depressed or irritable moods.  Patient reorts difficulty with anxiety but it's better.  OCD is better not gone.  Patient denies difficulty with sleep initiation or maintenance but vivid disturbing dreams. Denies appetite disturbance.  Patient reports that energy and motivation have been good.  Patient denies any difficulty with concentration other than distraction from OCD.  Patient denies any suicidal ideation.  Clear benefit from each of the meds added.  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   Review of Systems:  Review of Systems  Neurological: Negative for tremors and weakness.  Psychiatric/Behavioral: Negative for agitation, behavioral problems, confusion, decreased concentration, dysphoric mood, hallucinations, self-injury, sleep disturbance and suicidal ideas. The patient is nervous/anxious. The patient is not hyperactive.     Medications: I have reviewed the patient's current medications.  Current Outpatient Medications  Medication Sig Dispense Refill  . allopurinol (ZYLOPRIM) 300 MG tablet Take 1 tablet (300 mg total) by mouth daily. 30 tablet 1  . colchicine 0.6 MG tablet Take 1 tablet (0.6 mg total) by mouth daily. May take an extra tab for flair 30 tablet 4  . divalproex (DEPAKOTE ER) 500 MG 24 hr tablet Take 500 mg by mouth 2 (two) times daily. At  night    . fluvoxaMINE (LUVOX)  50 MG tablet Take 2 tablets (100 mg total) by mouth at bedtime. 180 tablet 0  . lamoTRIgine (LAMICTAL) 25 MG tablet Take 25 mg by mouth 2 (two) times daily.    . Loratadine-Pseudoephedrine (CLARITIN-D 24 HOUR PO) Take by mouth daily.    Marland Kitchen neomycin-polymyxin-hydrocortisone (CORTISPORIN) OTIC solution Place 4 drops into both ears 4 (four) times daily. 10 mL 0  . VITAMIN D, CHOLECALCIFEROL, PO Take by mouth daily.     No current facility-administered medications for this visit.     Medication Side Effects: Sedation manageable. Vivid dreaming from lamotrigine is some better.  Wouldn't trade the gains for the drowsiness.  Allergies: No Known Allergies  Past Medical History:  Diagnosis Date  . Abnormal TSH   . Bipolar disorder ([place])   . GERD (gastroesophageal reflux disease)   . History of mononucleosis   . Hyperglycemia   . Lymphocytosis   . Vitamin B 12 deficiency   . Vitamin D deficiency     Family History  Problem Relation Age of Onset  . Other Sister        substance abuse  . Cancer Maternal Grandmother   . Hypertension Maternal Grandfather   . Cancer Paternal Grandfather     Social History   Socioeconomic History  . Marital status: Married    Spouse name: Not on file  . Number of children: Not on file  . Years of education: Not on file  . Highest education level: Not on file  Occupational History  . Not on file  Social Needs  .  Financial resource strain: Not on file  . Food insecurity:    Worry: Not on file    Inability: Not on file  . Transportation needs:    Medical: Not on file    Non-medical: Not on file  Tobacco Use  . Smoking status: Former Smoker    Last attempt to quit: 12/19/2012    Years since quitting: 5.1  . Smokeless tobacco: Former Network engineer and Sexual Activity  . Alcohol use: Yes    Comment: weekly  . Drug use: No  . Sexual activity: Not on file  Lifestyle  . Physical activity:    Days per week: Not on file    Minutes per session:  Not on file  . Stress: Not on file  Relationships  . Social connections:    Talks on phone: Not on file    Gets together: Not on file    Attends religious service: Not on file    Active member of club or organization: Not on file    Attends meetings of clubs or organizations: Not on file    Relationship status: Not on file  . Intimate partner violence:    Fear of current or ex partner: Not on file    Emotionally abused: Not on file    Physically abused: Not on file    Forced sexual activity: Not on file  Other Topics Concern  . Not on file  Social History Narrative  . Not on file    Past Medical History, Surgical history, Social history, and Family history were reviewed and updated as appropriate.   Involved in church.  Please see review of systems for further details on the patient's review from today.   Objective:   Physical Exam:  There were no vitals taken for this visit.  Physical Exam  Constitutional: He is oriented to person, place, and time. He appears well-developed. No distress.  Musculoskeletal: He exhibits no deformity.  Neurological: He is alert and oriented to person, place, and time. He displays no tremor. Coordination and gait normal.  Psychiatric: His speech is normal and behavior is normal. Judgment and thought content normal. His mood appears anxious. His affect is not angry, not blunt, not labile and not inappropriate. Cognition and memory are normal. He does not exhibit a depressed mood. He expresses no homicidal and no suicidal ideation. He expresses no suicidal plans and no homicidal plans.  Insight intact. No auditory or visual hallucinations. OCD present but improving. He is attentive.    Lab Review:     Component Value Date/Time   NA 139 08/16/2016 0856   K 3.8 08/16/2016 0856   CL 99 08/16/2016 0856   CO2 24 08/16/2016 0856   GLUCOSE 94 08/16/2016 0856   BUN 18 08/16/2016 0856   CREATININE 1.14 08/16/2016 0856   CALCIUM 8.9 08/16/2016 0856    PROT 6.5 08/16/2016 0856   ALBUMIN 4.4 08/16/2016 0856   AST 20 08/16/2016 0856   ALT 25 08/16/2016 0856   ALKPHOS 51 08/16/2016 0856   BILITOT 0.4 08/16/2016 0856   GFRNONAA 80 08/16/2016 0856   GFRAA 92 08/16/2016 0856       Component Value Date/Time   WBC 9.0 08/16/2016 0856   RBC 5.40 08/16/2016 0856   HGB 15.7 08/16/2016 0856   HCT 45.6 08/16/2016 0856   PLT 220 08/16/2016 0856   MCV 84 08/16/2016 0856   MCH 29.1 08/16/2016 0856   MCHC 34.4 08/16/2016 0856   RDW 14.1 08/16/2016 0856  LYMPHSABS 3.6 (H) 08/16/2016 0856   EOSABS 0.4 08/16/2016 0856   BASOSABS 0.0 08/16/2016 0856    No results found for: POCLITH, LITHIUM   No results found for: PHENYTOIN, PHENOBARB, VALPROATE, CBMZ   Remote VPA 63 on 750mg /d Oct 2018 and LFT's stable.  .res Assessment: Plan:    Bipolar II disorder ([place])  Mixed obsessional thoughts and acts  Generalized anxiety disorder  Please see After Visit Summary for patient specific instructions.  Patient's rapid cycling bipolar disorder is much better controlled with the Depakote and lamotrigine he is tolerating it reasonably well with some morning drowsiness which is manageable.  His anger is manageable at this time.  OCD is much improved also though not resolved.  He is only been on fluvoxamine 3-4 weeks.  We discussed how it may take 2-3 months to get the full benefit of fluvoxamine.  Because he is having some morning drowsiness we will defer an increase though he is taking lower than the usual doses used for OCD.  He is aware of that we have the option to go up.  This is a 25-minute appointment.  Call if there are worsening symptoms.  Follow-up 8 weeks  Lynder Parents MD, DFAPA  Future Appointments  Date Time Provider [place]  02/13/2018  8:45 AM Guadalupe Maple, MD [facility]    No orders of the defined types were placed in this encounter.     -------------------------------

## 2018-02-13 ENCOUNTER — Encounter: Payer: Self-pay | Admitting: Family Medicine

## 2018-02-13 ENCOUNTER — Ambulatory Visit: Payer: BLUE CROSS/BLUE SHIELD | Admitting: Family Medicine

## 2018-02-13 VITALS — BP 138/86 | HR 84 | Temp 97.4°F | Ht 69.6 in | Wt 261.8 lb

## 2018-02-13 DIAGNOSIS — E782 Mixed hyperlipidemia: Secondary | ICD-10-CM | POA: Diagnosis not present

## 2018-02-13 DIAGNOSIS — Z23 Encounter for immunization: Secondary | ICD-10-CM

## 2018-02-13 DIAGNOSIS — E559 Vitamin D deficiency, unspecified: Secondary | ICD-10-CM

## 2018-02-13 DIAGNOSIS — F3181 Bipolar II disorder: Secondary | ICD-10-CM

## 2018-02-13 DIAGNOSIS — M10071 Idiopathic gout, right ankle and foot: Secondary | ICD-10-CM | POA: Diagnosis not present

## 2018-02-13 MED ORDER — ALLOPURINOL 300 MG PO TABS: 300.0000 mg | ORAL_TABLET | Freq: Every day | ORAL | 3 refills | Status: DC

## 2018-02-13 NOTE — Progress Notes (Signed)
BP 138/86   Pulse 84   Temp (!) 97.4 F (36.3 C) (Oral)   Ht 5' 9.6" (1.768 m)   Wt 261 lb 12.8 oz (118.8 kg)   SpO2 97%   BMI 38.00 kg/m    Subjective:    Patient ID: Rodney Peters, male    DOB: [birth year], 42 y.o.   MRN: [id number]  HPI: Rodney Peters is a 43 y.o. male  Chief Complaint  Patient presents with  . Gout  . Medication Refill  Patient with no symptoms of gout and doing well taking allopurinol without problems. Has right TMJ symptoms does not take big bites or chew gum does possibly grind his teeth at night does not use a mouthguard yet. Depression nerve issues are stable working with psychiatrist and getting great results. Reviewed labs and will recheck with vitamin D lipids and uric acid.  Relevant past medical, surgical, family and social history reviewed and updated as indicated. Interim medical history since our last visit reviewed. Allergies and medications reviewed and updated.  Review of Systems  Constitutional: Negative.   Respiratory: Negative.   Cardiovascular: Negative.     Per HPI unless specifically indicated above     Objective:    BP 138/86   Pulse 84   Temp (!) 97.4 F (36.3 C) (Oral)   Ht 5' 9.6" (1.768 m)   Wt 261 lb 12.8 oz (118.8 kg)   SpO2 97%   BMI 38.00 kg/m   Wt Readings from Last 3 Encounters:  02/13/18 261 lb 12.8 oz (118.8 kg)  08/16/16 244 lb (110.7 kg)  03/12/16 240 lb (108.9 kg)    Physical Exam  Constitutional: He is oriented to person, place, and time. He appears well-developed and well-nourished.  HENT:  Head: Normocephalic and atraumatic.  Eyes: Conjunctivae and EOM are normal.  Neck: Normal range of motion.  Cardiovascular: Normal rate, regular rhythm and normal heart sounds.  Pulmonary/Chest: Effort normal and breath sounds normal.  Musculoskeletal: Normal range of motion.  Neurological: He is alert and oriented to person, place, and time.  Skin: No erythema.  Psychiatric: He has a normal mood and  affect. His behavior is normal. Judgment and thought content normal.    Results for orders placed or performed in visit on 08/16/16  Microscopic Examination  Result Value Ref Range   WBC, UA 0-5 0 - 5 /hpf   Epithelial Cells (non renal) CANCELED   Uric acid  Result Value Ref Range   Uric Acid 7.4 3.7 - 8.6 mg/dL  CBC with Differential/Platelet  Result Value Ref Range   WBC 9.0 3.4 - 10.8 x10E3/uL   RBC 5.40 4.14 - 5.80 x10E6/uL   Hemoglobin 15.7 13.0 - 17.7 g/dL   Hematocrit 45.6 37.5 - 51.0 %   MCV 84 79 - 97 fL   MCH 29.1 26.6 - 33.0 pg   MCHC 34.4 31.5 - 35.7 g/dL   RDW 14.1 12.3 - 15.4 %   Platelets 220 150 - 379 x10E3/uL   Neutrophils 48 Not Estab. %   Lymphs 40 Not Estab. %   Monocytes 7 Not Estab. %   Eos 5 Not Estab. %   Basos 0 Not Estab. %   Neutrophils Absolute 4.3 1.4 - 7.0 x10E3/uL   Lymphocytes Absolute 3.6 (H) 0.7 - 3.1 x10E3/uL   Monocytes Absolute 0.7 0.1 - 0.9 x10E3/uL   EOS (ABSOLUTE) 0.4 0.0 - 0.4 x10E3/uL   Basophils Absolute 0.0 0.0 - 0.2 x10E3/uL   Immature  Granulocytes 0 Not Estab. %   Immature Grans (Abs) 0.0 0.0 - 0.1 x10E3/uL  Comprehensive metabolic panel  Result Value Ref Range   Glucose 94 65 - 99 mg/dL   BUN 18 6 - 24 mg/dL   Creatinine, Ser 1.14 0.76 - 1.27 mg/dL   GFR calc non Af Amer 80 >59 mL/min/1.73   GFR calc Af Amer 92 >59 mL/min/1.73   BUN/Creatinine Ratio 16 9 - 20   Sodium 139 134 - 144 mmol/L   Potassium 3.8 3.5 - 5.2 mmol/L   Chloride 99 96 - 106 mmol/L   CO2 24 18 - 29 mmol/L   Calcium 8.9 8.7 - 10.2 mg/dL   Total Protein 6.5 6.0 - 8.5 g/dL   Albumin 4.4 3.5 - 5.5 g/dL   Globulin, Total 2.1 1.5 - 4.5 g/dL   Albumin/Globulin Ratio 2.1 1.2 - 2.2   Bilirubin Total 0.4 0.0 - 1.2 mg/dL   Alkaline Phosphatase 51 39 - 117 IU/L   AST 20 0 - 40 IU/L   ALT 25 0 - 44 IU/L  Lipid panel  Result Value Ref Range   Cholesterol, Total 215 (H) 100 - 199 mg/dL   Triglycerides 645 (HH) 0 - 149 mg/dL   HDL 29 (L) >39 mg/dL   VLDL  Cholesterol Cal Comment 5 - 40 mg/dL   LDL Calculated Comment 0 - 99 mg/dL   Chol/HDL Ratio 7.4 (H) 0.0 - 5.0 ratio  TSH  Result Value Ref Range   TSH 3.230 0.450 - 4.500 uIU/mL  Urinalysis, Routine w reflex microscopic  Result Value Ref Range   Specific Gravity, UA 1.015 1.005 - 1.030   pH, UA 5.0 5.0 - 7.5   Color, UA Yellow Yellow   Appearance Ur Clear Clear   Leukocytes, UA Negative Negative   Protein, UA Negative Negative/Trace   Glucose, UA Negative Negative   Ketones, UA Negative Negative   RBC, UA Negative Negative   Bilirubin, UA Negative Negative   Urobilinogen, Ur 0.2 0.2 - 1.0 mg/dL   Nitrite, UA Negative Negative   Microscopic Examination See below:   Vitamin D (25 hydroxy)  Result Value Ref Range   Vit D, 25-Hydroxy 23.8 (L) 30.0 - 100.0 ng/mL  PSA  Result Value Ref Range   Prostate Specific Ag, Serum 0.4 0.0 - 4.0 ng/mL      Assessment & Plan:   Problem List Items Addressed This Visit      Other   Gout    The current medical regimen is effective;  continue present plan and medications.       Relevant Medications   allopurinol (ZYLOPRIM) 300 MG tablet   Other Relevant Orders   Uric acid   Basic metabolic panel   Vitamin D deficiency    Check labs      Relevant Orders   VITAMIN D 25 Hydroxy (Vit-D Deficiency, Fractures)   Bipolar 2 disorder ([place])    The current medical regimen is effective;  continue present plan and medications.       Relevant Orders   Basic metabolic panel   Elevated triglycerides with high cholesterol    Check labs       Relevant Orders   Lipid panel    Other Visit Diagnoses    Need for influenza vaccination    -  Primary   Relevant Orders   Flu Vaccine QUAD 36+ mos IM (Completed)       Follow up plan: Return in about 6 months (around  08/14/2018) for Physical Exam.

## 2018-02-13 NOTE — Assessment & Plan Note (Signed)
The current medical regimen is effective;  continue present plan and medications.  

## 2018-02-13 NOTE — Patient Instructions (Signed)

## 2018-02-13 NOTE — Assessment & Plan Note (Signed)
Check labs 

## 2018-02-14 ENCOUNTER — Encounter: Payer: Self-pay | Admitting: Family Medicine

## 2018-02-14 LAB — LIPID PANEL
Chol/HDL Ratio: 7.4 ratio — ABNORMAL HIGH (ref 0.0–5.0)
Cholesterol, Total: 223 mg/dL — ABNORMAL HIGH (ref 100–199)
HDL: 30 mg/dL — ABNORMAL LOW (ref >39)
LDL Calculated: 114 mg/dL — ABNORMAL HIGH (ref 0–99)
Triglycerides: 396 mg/dL — ABNORMAL HIGH (ref 0–149)
VLDL Cholesterol Cal: 79 mg/dL — ABNORMAL HIGH (ref 5–40)

## 2018-02-14 LAB — BASIC METABOLIC PANEL
BUN/Creatinine Ratio: 11 (ref 9–20)
BUN: 11 mg/dL (ref 6–24)
CO2: 22 mmol/L (ref 20–29)
Calcium: 9.2 mg/dL (ref 8.7–10.2)
Chloride: 102 mmol/L (ref 96–106)
Creatinine, Ser: 1 mg/dL (ref 0.76–1.27)
GFR calc Af Amer: 108 mL/min/{1.73_m2} (ref >59)
GFR calc non Af Amer: 93 mL/min/{1.73_m2} (ref >59)
Glucose: 93 mg/dL (ref 65–99)
Potassium: 4.3 mmol/L (ref 3.5–5.2)
Sodium: 142 mmol/L (ref 134–144)

## 2018-02-14 LAB — VITAMIN D 25 HYDROXY (VIT D DEFICIENCY, FRACTURES): Vit D, 25-Hydroxy: 43.9 ng/mL (ref 30.0–100.0)

## 2018-02-14 LAB — URIC ACID: Uric Acid: 5.4 mg/dL (ref 3.7–8.6)

## 2018-03-18 ENCOUNTER — Encounter: Payer: Self-pay | Admitting: Emergency Medicine

## 2018-03-18 DIAGNOSIS — F411 Generalized anxiety disorder: Secondary | ICD-10-CM | POA: Insufficient documentation

## 2018-03-23 ENCOUNTER — Other Ambulatory Visit: Payer: Self-pay

## 2018-03-23 MED ORDER — LAMOTRIGINE 25 MG PO TABS: 50.0000 mg | ORAL_TABLET | Freq: Every day | ORAL | 1 refills | Status: DC

## 2018-03-27 ENCOUNTER — Telehealth: Payer: Self-pay | Admitting: Psychiatry

## 2018-03-27 NOTE — Telephone Encounter (Signed)
Pt requesting refills for Lamotrigine and Depakote at [facility]. Next

## 2018-03-29 ENCOUNTER — Ambulatory Visit: Payer: BLUE CROSS/BLUE SHIELD | Admitting: Psychiatry

## 2018-04-02 ENCOUNTER — Other Ambulatory Visit: Payer: Self-pay

## 2018-04-02 MED ORDER — DIVALPROEX SODIUM ER 500 MG PO TB24: 500.0000 mg | ORAL_TABLET | Freq: Every day | ORAL | 0 refills | Status: DC

## 2018-04-11 ENCOUNTER — Other Ambulatory Visit: Payer: Self-pay | Admitting: Psychiatry

## 2018-04-17 ENCOUNTER — Ambulatory Visit (INDEPENDENT_AMBULATORY_CARE_PROVIDER_SITE_OTHER): Payer: Self-pay | Admitting: Family Medicine

## 2018-04-17 ENCOUNTER — Other Ambulatory Visit: Payer: Self-pay

## 2018-04-17 ENCOUNTER — Encounter: Payer: Self-pay | Admitting: Family Medicine

## 2018-04-17 VITALS — BP 123/87 | HR 94 | Temp 98.5°F | Ht 70.0 in | Wt 259.0 lb

## 2018-04-17 DIAGNOSIS — J189 Pneumonia, unspecified organism: Secondary | ICD-10-CM

## 2018-04-17 MED ORDER — PREDNISONE 10 MG PO TABS: ORAL_TABLET | ORAL | 0 refills | Status: DC

## 2018-04-17 MED ORDER — AZITHROMYCIN 250 MG PO TABS: ORAL_TABLET | ORAL | 0 refills | Status: DC

## 2018-04-17 MED ORDER — HYDROCOD POLST-CPM POLST ER 10-8 MG/5ML PO SUER: 5.0000 mL | Freq: Every evening | ORAL | 0 refills | Status: DC | PRN

## 2018-04-17 NOTE — Progress Notes (Signed)
BP 123/87   Pulse 94   Temp 98.5 F (36.9 C) (Oral)   Ht 5\' 10"  (1.778 m)   Wt 259 lb (117.5 kg)   SpO2 95%   BMI 37.16 kg/m    Subjective:    Patient ID: Rodney Peters, male    DOB: Aug 14, 1975, 43 y.o.   MRN: [id number]  HPI: Rodney Peters is a 43 y.o. male  Chief Complaint  Patient presents with  . Cough    off and on since thanksgiven. pt states cough is getting worse lately. vomit and back and chest pain when cough  . Headache    pt states has tried his son albuterol nebulizer and OTC meds   UPPER RESPIRATORY TRACT INFECTION Duration: 2 months Worst symptom: cough Fever: no Cough: yes Shortness of breath: yes Wheezing: no Chest pain: yes, with cough Chest tightness: yes Chest congestion: yes Nasal congestion: yes Runny nose: yes Post nasal drip: yes Sneezing: no Sore throat: no Swollen glands: no Sinus pressure: no Headache: yes Face pain: no Toothache: no Ear pain: no  Ear pressure: yes "right Eyes red/itching:no Eye drainage/crusting: no  Vomiting: yes Rash: no Fatigue: yes Sick contacts: yes Strep contacts: no  Context: stable Recurrent sinusitis: no Relief with OTC cold/cough medications: no  Treatments attempted: cold/sinus, mucinex, anti-histamine, pseudoephedrine and cough syrup   Relevant past medical, surgical, family and social history reviewed and updated as indicated. Interim medical history since our last visit reviewed. Allergies and medications reviewed and updated.  Review of Systems  Constitutional: Positive for fatigue and fever. Negative for activity change, appetite change, chills, diaphoresis and unexpected weight change.  HENT: Positive for congestion, postnasal drip, rhinorrhea, sinus pressure, sore throat and tinnitus. Negative for dental problem, drooling, ear discharge, ear pain, facial swelling, hearing loss, mouth sores, nosebleeds, sinus pain, sneezing, trouble swallowing and voice change.   Eyes: Negative.     Respiratory: Positive for cough, chest tightness and shortness of breath. Negative for apnea, choking, wheezing and stridor.   Cardiovascular: Negative.   Gastrointestinal: Positive for nausea and vomiting. Negative for abdominal distention, abdominal pain, anal bleeding, blood in stool, constipation, diarrhea and rectal pain.  Psychiatric/Behavioral: Negative.     Per HPI unless specifically indicated above     Objective:    BP 123/87   Pulse 94   Temp 98.5 F (36.9 C) (Oral)   Ht 5\' 10"  (1.778 m)   Wt 259 lb (117.5 kg)   SpO2 95%   BMI 37.16 kg/m   Wt Readings from Last 3 Encounters:  04/17/18 259 lb (117.5 kg)  02/13/18 261 lb 12.8 oz (118.8 kg)  08/16/16 244 lb (110.7 kg)    Physical Exam Vitals signs and nursing note reviewed.  Constitutional:      General: He is not in acute distress.    Appearance: Normal appearance. He is not ill-appearing, toxic-appearing or diaphoretic.  HENT:     Head: Normocephalic and atraumatic.     Right Ear: Tympanic membrane, ear canal and external ear normal. There is no impacted cerumen.     Left Ear: Tympanic membrane, ear canal and external ear normal. There is no impacted cerumen.     Nose: Congestion and rhinorrhea present.     Mouth/Throat:     Mouth: Mucous membranes are moist.     Pharynx: Oropharynx is clear. No oropharyngeal exudate or posterior oropharyngeal erythema.  Eyes:     General: No scleral icterus.  Right eye: No discharge.        Left eye: No discharge.     Extraocular Movements: Extraocular movements intact.     Conjunctiva/sclera: Conjunctivae normal.     Pupils: Pupils are equal, round, and reactive to light.  Neck:     Musculoskeletal: Normal range of motion and neck supple. No neck rigidity or muscular tenderness.     Vascular: No carotid bruit.  Cardiovascular:     Rate and Rhythm: Normal rate and regular rhythm.     Pulses: Normal pulses.     Heart sounds: Normal heart sounds. No murmur. No  friction rub. No gallop.   Pulmonary:     Effort: Pulmonary effort is normal. No respiratory distress.     Breath sounds: Normal breath sounds. No stridor. No wheezing, rhonchi or rales.  Chest:     Chest wall: No tenderness.  Musculoskeletal: Normal range of motion.  Lymphadenopathy:     Cervical: Cervical adenopathy present.  Skin:    General: Skin is warm and dry.     Capillary Refill: Capillary refill takes less than 2 seconds.     Coloration: Skin is not jaundiced or pale.     Findings: No bruising, erythema, lesion or rash.  Neurological:     General: No focal deficit present.     Mental Status: He is alert and oriented to person, place, and time. Mental status is at baseline.     Cranial Nerves: No cranial nerve deficit.     Sensory: No sensory deficit.     Motor: No weakness.     Coordination: Coordination normal.     Gait: Gait normal.     Deep Tendon Reflexes: Reflexes normal.  Psychiatric:        Mood and Affect: Mood normal.        Behavior: Behavior normal.        Thought Content: Thought content normal.        Judgment: Judgment normal.     Results for orders placed or performed in visit on 02/13/18  Lipid panel  Result Value Ref Range   Cholesterol, Total 223 (H) 100 - 199 mg/dL   Triglycerides 396 (H) 0 - 149 mg/dL   HDL 30 (L) >39 mg/dL   VLDL Cholesterol Cal 79 (H) 5 - 40 mg/dL   LDL Calculated 114 (H) 0 - 99 mg/dL   Chol/HDL Ratio 7.4 (H) 0.0 - 5.0 ratio  Uric acid  Result Value Ref Range   Uric Acid 5.4 3.7 - 8.6 mg/dL  VITAMIN D 25 Hydroxy (Vit-D Deficiency, Fractures)  Result Value Ref Range   Vit D, 25-Hydroxy 43.9 30.0 - 100.0 ng/mL  Basic metabolic panel  Result Value Ref Range   Glucose 93 65 - 99 mg/dL   BUN 11 6 - 24 mg/dL   Creatinine, Ser 1.00 0.76 - 1.27 mg/dL   GFR calc non Af Amer 93 >59 mL/min/1.73   GFR calc Af Amer 108 >59 mL/min/1.73   BUN/Creatinine Ratio 11 9 - 20   Sodium 142 134 - 144 mmol/L   Potassium 4.3 3.5 - 5.2  mmol/L   Chloride 102 96 - 106 mmol/L   CO2 22 20 - 29 mmol/L   Calcium 9.2 8.7 - 10.2 mg/dL      Assessment & Plan:   Problem List Items Addressed This Visit    None    Visit Diagnoses    Atypical pneumonia    -  Primary   Will treat  with azithromycin, tussionex and prednisone. Recheck lungs 2 weeks- if getting worse, x-ray. Call with any concerns.    Relevant Medications   azithromycin (ZITHROMAX) 250 MG tablet       Follow up plan: Return in about 2 weeks (around 05/01/2018) for lung recheck.

## 2018-04-24 ENCOUNTER — Ambulatory Visit (INDEPENDENT_AMBULATORY_CARE_PROVIDER_SITE_OTHER): Payer: BLUE CROSS/BLUE SHIELD | Admitting: Family Medicine

## 2018-04-24 ENCOUNTER — Encounter: Payer: Self-pay | Admitting: Family Medicine

## 2018-04-24 VITALS — BP 122/84 | HR 76 | Temp 98.6°F | Ht 70.0 in | Wt 260.0 lb

## 2018-04-24 DIAGNOSIS — J189 Pneumonia, unspecified organism: Secondary | ICD-10-CM

## 2018-04-24 DIAGNOSIS — G4700 Insomnia, unspecified: Secondary | ICD-10-CM

## 2018-04-24 MED ORDER — TRAZODONE HCL 50 MG PO TABS: 25.0000 mg | ORAL_TABLET | Freq: Every evening | ORAL | 0 refills | Status: DC | PRN

## 2018-04-24 NOTE — Progress Notes (Signed)
BP 122/84   Pulse 76   Temp 98.6 F (37 C) (Oral)   Ht 5\' 10"  (1.778 m)   Wt 260 lb (117.9 kg)   SpO2 95%   BMI 37.31 kg/m    Subjective:    Patient ID: Rodney Peters, male    DOB: 01-12-1976, 43 y.o.   MRN: [id number]  HPI: Rodney Peters is a 43 y.o. male  Chief Complaint  Patient presents with  . Lung check  . Cough    Different. Not hurting as much. When it startes it feels unmanagle.   . Insomnia    Trouble staying asleep, believed to be due to prednisode.    Still having the coughing fits. No throwing up. No more pain, but still having a lot of coughing fits. No fevers. No chills. Continuing to feel better.   Has been having trouble sleeping with the prednisone. Has been getting better since coming off of it, but still having trouble.   Relevant past medical, surgical, family and social history reviewed and updated as indicated. Interim medical history since our last visit reviewed. Allergies and medications reviewed and updated.  Review of Systems  Constitutional: Negative.   HENT: Negative.   Respiratory: Positive for cough and chest tightness. Negative for apnea, choking, shortness of breath, wheezing and stridor.   Cardiovascular: Negative.     Per HPI unless specifically indicated above     Objective:    BP 122/84   Pulse 76   Temp 98.6 F (37 C) (Oral)   Ht 5\' 10"  (1.778 m)   Wt 260 lb (117.9 kg)   SpO2 95%   BMI 37.31 kg/m   Wt Readings from Last 3 Encounters:  04/24/18 260 lb (117.9 kg)  04/17/18 259 lb (117.5 kg)  02/13/18 261 lb 12.8 oz (118.8 kg)    Physical Exam Vitals signs and nursing note reviewed.  Constitutional:      General: He is not in acute distress.    Appearance: Normal appearance. He is not ill-appearing, toxic-appearing or diaphoretic.  HENT:     Head: Normocephalic and atraumatic.     Right Ear: External ear normal.     Left Ear: External ear normal.     Nose: Nose normal.     Mouth/Throat:     Mouth: Mucous  membranes are moist.     Pharynx: Oropharynx is clear.  Eyes:     General: No scleral icterus.       Right eye: No discharge.        Left eye: No discharge.     Extraocular Movements: Extraocular movements intact.     Conjunctiva/sclera: Conjunctivae normal.     Pupils: Pupils are equal, round, and reactive to light.  Neck:     Musculoskeletal: Normal range of motion and neck supple.  Cardiovascular:     Rate and Rhythm: Normal rate and regular rhythm.     Pulses: Normal pulses.     Heart sounds: Normal heart sounds. No murmur. No friction rub. No gallop.   Pulmonary:     Effort: Pulmonary effort is normal. No respiratory distress.     Breath sounds: Normal breath sounds. No stridor. No wheezing, rhonchi or rales.     Comments: Coarser breath sounds LLL Chest:     Chest wall: No tenderness.  Musculoskeletal: Normal range of motion.  Skin:    General: Skin is warm and dry.     Capillary Refill: Capillary refill takes less than 2  seconds.     Coloration: Skin is not jaundiced or pale.     Findings: No bruising, erythema, lesion or rash.  Neurological:     General: No focal deficit present.     Mental Status: He is alert and oriented to person, place, and time. Mental status is at baseline.  Psychiatric:        Mood and Affect: Mood normal.        Behavior: Behavior normal.        Thought Content: Thought content normal.        Judgment: Judgment normal.     Results for orders placed or performed in visit on 02/13/18  Lipid panel  Result Value Ref Range   Cholesterol, Total 223 (H) 100 - 199 mg/dL   Triglycerides 396 (H) 0 - 149 mg/dL   HDL 30 (L) >39 mg/dL   VLDL Cholesterol Cal 79 (H) 5 - 40 mg/dL   LDL Calculated 114 (H) 0 - 99 mg/dL   Chol/HDL Ratio 7.4 (H) 0.0 - 5.0 ratio  Uric acid  Result Value Ref Range   Uric Acid 5.4 3.7 - 8.6 mg/dL  VITAMIN D 25 Hydroxy (Vit-D Deficiency, Fractures)  Result Value Ref Range   Vit D, 25-Hydroxy 43.9 30.0 - 100.0 ng/mL  Basic  metabolic panel  Result Value Ref Range   Glucose 93 65 - 99 mg/dL   BUN 11 6 - 24 mg/dL   Creatinine, Ser 1.00 0.76 - 1.27 mg/dL   GFR calc non Af Amer 93 >59 mL/min/1.73   GFR calc Af Amer 108 >59 mL/min/1.73   BUN/Creatinine Ratio 11 9 - 20   Sodium 142 134 - 144 mmol/L   Potassium 4.3 3.5 - 5.2 mmol/L   Chloride 102 96 - 106 mmol/L   CO2 22 20 - 29 mmol/L   Calcium 9.2 8.7 - 10.2 mg/dL      Assessment & Plan:   Problem List Items Addressed This Visit    None    Visit Diagnoses    Atypical pneumonia    -  Primary   Doing better. If not better by Thursday, will get CXR. Continue to monitor.    Relevant Orders   DG Chest 2 View   Acute insomnia       Will treat with trazodone. Call with any concerns. Continue to monitor.        Follow up plan: Return if symptoms worsen or fail to improve.

## 2018-04-25 ENCOUNTER — Ambulatory Visit (INDEPENDENT_AMBULATORY_CARE_PROVIDER_SITE_OTHER): Payer: BLUE CROSS/BLUE SHIELD | Admitting: Psychiatry

## 2018-04-25 ENCOUNTER — Encounter: Payer: Self-pay | Admitting: Psychiatry

## 2018-04-25 DIAGNOSIS — F422 Mixed obsessional thoughts and acts: Secondary | ICD-10-CM | POA: Diagnosis not present

## 2018-04-25 DIAGNOSIS — F3181 Bipolar II disorder: Secondary | ICD-10-CM | POA: Diagnosis not present

## 2018-04-25 DIAGNOSIS — F411 Generalized anxiety disorder: Secondary | ICD-10-CM | POA: Diagnosis not present

## 2018-04-25 MED ORDER — LAMOTRIGINE 25 MG PO TABS: 50.0000 mg | ORAL_TABLET | Freq: Every day | ORAL | 1 refills | Status: DC

## 2018-04-25 MED ORDER — DIVALPROEX SODIUM ER 500 MG PO TB24: 1000.0000 mg | ORAL_TABLET | Freq: Every day | ORAL | 1 refills | Status: DC

## 2018-04-25 MED ORDER — FLUVOXAMINE MALEATE 100 MG PO TABS: 100.0000 mg | ORAL_TABLET | Freq: Every day | ORAL | 1 refills | Status: DC

## 2018-04-25 NOTE — Progress Notes (Signed)
Rodney Peters [id number] 04-20-1975 43 y.o.  Subjective:   Patient ID:  Rodney Peters is a 43 y.o. (DOB October 27, 1975) male.  Chief Complaint:  Chief Complaint  Patient presents with  . Follow-up    Medicatin Management   Last seen November 11 HPI Rodney Peters presents to the office today for follow-up of bipolar and anxiety.  Feels overall a lot better, much calmer, and even better than last time.  Drowsiness still annoying but manageable.  Benefit from Luvox added (on 100mg  for awhile), despite external stressors being tough.  Has seen some improvement is OCD.  Less rewriting.  Less anxiety around it.  Had severe panic attack, brutal, unusual after 4 days of half dose Depakote.  Patient reports stable mood and denies depressed or irritable moods. Mood swings managed. Patient reorts difficulty with anxiety but it's better.  OCD is better not gone.  Patient denies difficulty with sleep initiation or maintenance but vivid disturbing dreams. Denies appetite disturbance.  Patient reports that energy and motivation have been good.  Patient denies any difficulty with concentration other than distraction from OCD.  Patient denies any suicidal ideation.  Clear benefit from each of the meds added.  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  Wife complains that fluvoxamine seems to make him a little more curt and apt to be dismissive of disagreements.   Review of Systems:  Review of Systems  Neurological: Negative for tremors and weakness.  Psychiatric/Behavioral: Negative for agitation, behavioral problems, confusion, decreased concentration, dysphoric mood, hallucinations, self-injury, sleep disturbance and suicidal ideas. The patient is not nervous/anxious and is not hyperactive.     Medications: I have reviewed the patient's current medications.  Current Outpatient Medications  Medication Sig Dispense Refill  . allopurinol (ZYLOPRIM) 300 MG tablet Take 1  tablet (300 mg total) by mouth daily. 90 tablet 3  . colchicine 0.6 MG tablet Take 1 tablet (0.6 mg total) by mouth daily. May take an extra tab for flair 30 tablet 4  . divalproex (DEPAKOTE ER) 500 MG 24 hr tablet Take 1 tablet (500 mg total) by mouth daily. At  night 180 tablet 0  . fluvoxaMINE (LUVOX) 100 MG tablet Take 1 tablet (100 mg total) by mouth at bedtime. 90 tablet 0  . lamoTRIgine (LAMICTAL) 25 MG tablet Take 2 tablets (50 mg total) by mouth daily. 180 tablet 1  . Multiple Vitamin (MULTIVITAMIN) tablet Take 1 tablet by mouth daily.    Marland Kitchen VITAMIN D, CHOLECALCIFEROL, PO Take 5,000 Units by mouth daily.      No current facility-administered medications for this visit.     Medication Side Effects: Sedation manageable. Vivid dreaming from lamotrigine is some better.  Wouldn't trade the gains for the drowsiness.  Allergies: No Known Allergies  Past Medical History:  Diagnosis Date  . Abnormal TSH   . Anxiety   . Bipolar disorder ([place])   . GERD (gastroesophageal reflux disease)   . History of mononucleosis   . Hyperglycemia   . Lymphocytosis   . Vitamin B 12 deficiency   . Vitamin D deficiency     Family History  Problem Relation Age of Onset  . Anxiety disorder Father   . COPD Father   . Other Sister        substance abuse  . Cancer Maternal Grandmother   . Hypertension Maternal Grandfather   . Cancer Paternal Grandfather     Social History   Socioeconomic History  .  Marital status: Married    Spouse name: Not on file  . Number of children: Not on file  . Years of education: Not on file  . Highest education level: Not on file  Occupational History  . Not on file  Social Needs  . Financial resource strain: Not on file  . Food insecurity:    Worry: Not on file    Inability: Not on file  . Transportation needs:    Medical: Not on file    Non-medical: Not on file  Tobacco Use  . Smoking status: Former Smoker    Last attempt to quit: 12/19/2012    Years  since quitting: 5.3  . Smokeless tobacco: Former Network engineer and Sexual Activity  . Alcohol use: Yes    Comment: weekly  . Drug use: No  . Sexual activity: Not on file  Lifestyle  . Physical activity:    Days per week: Not on file    Minutes per session: Not on file  . Stress: Not on file  Relationships  . Social connections:    Talks on phone: Not on file    Gets together: Not on file    Attends religious service: Not on file    Active member of club or organization: Not on file    Attends meetings of clubs or organizations: Not on file    Relationship status: Not on file  . Intimate partner violence:    Fear of current or ex partner: Not on file    Emotionally abused: Not on file    Physically abused: Not on file    Forced sexual activity: Not on file  Other Topics Concern  . Not on file  Social History Narrative  . Not on file    Past Medical History, Surgical history, Social history, and Family history were reviewed and updated as appropriate.   Involved in church.  Please see review of systems for further details on the patient's review from today.   Objective:   Physical Exam:  There were no vitals taken for this visit.  Physical Exam Constitutional:      General: He is not in acute distress.    Appearance: He is well-developed.  Musculoskeletal:        General: No deformity.  Neurological:     Mental Status: He is alert and oriented to person, place, and time.     Motor: No tremor.     Coordination: Coordination normal.     Gait: Gait normal.  Psychiatric:        Attention and Perception: He is attentive.        Mood and Affect: Mood is anxious. Mood is not depressed. Affect is not labile, blunt, angry or inappropriate.        Speech: Speech normal.        Behavior: Behavior normal.        Thought Content: Thought content normal. Thought content does not include homicidal or suicidal ideation. Thought content does not include homicidal or suicidal  plan.        Judgment: Judgment normal.     Comments: Insight intact. No auditory or visual hallucinations. OCD present but improving.  Much calmer than he's ever been.     Lab Review:     Component Value Date/Time   NA 142 02/13/2018 0949   K 4.3 02/13/2018 0949   CL 102 02/13/2018 0949   CO2 22 02/13/2018 0949   GLUCOSE 93 02/13/2018 0949  BUN 11 02/13/2018 0949   CREATININE 1.00 02/13/2018 0949   CALCIUM 9.2 02/13/2018 0949   PROT 6.5 08/16/2016 0856   ALBUMIN 4.4 08/16/2016 0856   AST 20 08/16/2016 0856   ALT 25 08/16/2016 0856   ALKPHOS 51 08/16/2016 0856   BILITOT 0.4 08/16/2016 0856   GFRNONAA 93 02/13/2018 0949   GFRAA 108 02/13/2018 0949       Component Value Date/Time   WBC 9.0 08/16/2016 0856   RBC 5.40 08/16/2016 0856   HGB 15.7 08/16/2016 0856   HCT 45.6 08/16/2016 0856   PLT 220 08/16/2016 0856   MCV 84 08/16/2016 0856   MCH 29.1 08/16/2016 0856   MCHC 34.4 08/16/2016 0856   RDW 14.1 08/16/2016 0856   LYMPHSABS 3.6 (H) 08/16/2016 0856   EOSABS 0.4 08/16/2016 0856   BASOSABS 0.0 08/16/2016 0856    No results found for: POCLITH, LITHIUM   No results found for: PHENYTOIN, PHENOBARB, VALPROATE, CBMZ   Remote VPA 63 on 750mg /d Oct 2018 and LFT's stable.  .res Assessment: Plan:    Bipolar II disorder ([place])  Mixed obsessional thoughts and acts  Generalized anxiety disorder  Please see After Visit Summary for patient specific instructions.  Answered questions about trazodone for sleep and risk of adverse mood effects unlikely but possible.  Patient's rapid cycling bipolar disorder is much better controlled with the Depakote and lamotrigine he is tolerating it reasonably well with some morning drowsiness which is manageable.  His anger is manageable at this time.  Try Depakote at night and see if sleepiness is better, apparently that is the cause.  OCD is much improved also though not resolved.  He is been on fluvoxamine 45-months or so.  We  discussed how it may take 2-3 months to get the full benefit of fluvoxamine.  Because he is having some morning drowsiness we will defer an increase though he is taking lower than the usual doses used for OCD.  He is aware of that we have the option to go up.  Feels much calmer than ever.  Call if there are worsening symptoms.  May have to explain to wife the reason for the emotional changes to which she needs to adjust.  Changes are healthier.  Follow-up 6 mos  Lynder Parents MD, DFAPA  Future Appointments  Date Time Provider [place]  08/27/2018 10:00 AM Crissman, Jeannette How, MD [facility]    No orders of the defined types were placed in this encounter.     -------------------------------

## 2018-04-26 ENCOUNTER — Ambulatory Visit
Admission: RE | Admit: 2018-04-26 | Discharge: 2018-04-26 | Disposition: A | Discharge status: Home / Self Care | Payer: BLUE CROSS/BLUE SHIELD | Source: Ambulatory Visit | Attending: Family Medicine | Admitting: Family Medicine

## 2018-04-26 ENCOUNTER — Telehealth: Payer: Self-pay | Admitting: Family Medicine

## 2018-04-26 DIAGNOSIS — J189 Pneumonia, unspecified organism: Secondary | ICD-10-CM | POA: Diagnosis not present

## 2018-04-26 MED ORDER — LEVOFLOXACIN 750 MG PO TABS: 750.0000 mg | ORAL_TABLET | Freq: Every day | ORAL | 0 refills | Status: DC

## 2018-04-26 NOTE — Telephone Encounter (Signed)
Called and left detailed message with information on patients vm. DPR was checked.   

## 2018-04-26 NOTE — Telephone Encounter (Signed)
Please let him know that he does have a little pneumonia, so I've sent a new antibiotic to his pharmacy and I'll see him when he finishes it

## 2018-05-01 ENCOUNTER — Encounter: Payer: Self-pay | Admitting: Family Medicine

## 2018-05-01 ENCOUNTER — Other Ambulatory Visit: Payer: Self-pay

## 2018-05-01 ENCOUNTER — Ambulatory Visit: Payer: BLUE CROSS/BLUE SHIELD | Admitting: Family Medicine

## 2018-05-01 VITALS — BP 133/90 | HR 102 | Temp 98.5°F | Ht 70.0 in | Wt 259.0 lb

## 2018-05-01 DIAGNOSIS — M940 Chondrocostal junction syndrome [Tietze]: Secondary | ICD-10-CM

## 2018-05-01 DIAGNOSIS — J189 Pneumonia, unspecified organism: Secondary | ICD-10-CM | POA: Diagnosis not present

## 2018-05-01 MED ORDER — NAPROXEN 500 MG PO TABS: 500.0000 mg | ORAL_TABLET | Freq: Two times a day (BID) | ORAL | 0 refills | Status: DC

## 2018-05-01 MED ORDER — HYDROCOD POLST-CPM POLST ER 10-8 MG/5ML PO SUER: 5.0000 mL | Freq: Every evening | ORAL | 0 refills | Status: DC | PRN

## 2018-05-01 MED ORDER — ALBUTEROL SULFATE HFA 108 (90 BASE) MCG/ACT IN AERS: 2.0000 | INHALATION_SPRAY | Freq: Four times a day (QID) | RESPIRATORY_TRACT | 0 refills | Status: DC | PRN

## 2018-05-01 NOTE — Progress Notes (Signed)
BP 133/90   Pulse (!) 102   Temp 98.5 F (36.9 C) (Oral)   Ht 5\' 10"  (1.778 m)   Wt 259 lb (117.5 kg)   SpO2 94%   BMI 37.16 kg/m    Subjective:    Patient ID: Rodney Peters, male    DOB: 07/17/1975, 43 y.o.   MRN: [id number]  HPI: Rodney Peters is a 43 y.o. male  Chief Complaint  Patient presents with  . Cough    f/u pt states has had left side ribs front and back when cough   Notes that he is feeling a bit worse. He notes that he is in a lot more pain. Pain is now coming from his L side and seems to be come from the front to the back. He notes that he was doing well during the day, and got worse as the day went on yesterday until he notes that he was in a coughing fit again. He was not able to get the cough under good control. He notes that his chest is aching a lot. He laid on the couch with a heating pad. He is still quite tired, but starting to feel better. No fevers. No chills. Just coughing and chest pain.  Relevant past medical, surgical, family and social history reviewed and updated as indicated. Interim medical history since our last visit reviewed. Allergies and medications reviewed and updated.  Review of Systems  Constitutional: Positive for fatigue. Negative for activity change, appetite change, chills, diaphoresis, fever and unexpected weight change.  HENT: Negative.   Eyes: Negative.   Respiratory: Positive for cough and chest tightness. Negative for apnea, choking, shortness of breath, wheezing and stridor.   Cardiovascular: Negative.   Psychiatric/Behavioral: Negative.     Per HPI unless specifically indicated above     Objective:    BP 133/90   Pulse (!) 102   Temp 98.5 F (36.9 C) (Oral)   Ht 5\' 10"  (1.778 m)   Wt 259 lb (117.5 kg)   SpO2 94%   BMI 37.16 kg/m   Wt Readings from Last 3 Encounters:  05/01/18 259 lb (117.5 kg)  04/24/18 260 lb (117.9 kg)  04/17/18 259 lb (117.5 kg)    Physical Exam Vitals signs and nursing note reviewed.   Constitutional:      General: He is not in acute distress.    Appearance: Normal appearance. He is not ill-appearing, toxic-appearing or diaphoretic.  HENT:     Head: Normocephalic and atraumatic.     Right Ear: External ear normal.     Left Ear: External ear normal.     Nose: Nose normal.     Mouth/Throat:     Mouth: Mucous membranes are moist.     Pharynx: Oropharynx is clear.  Eyes:     General: No scleral icterus.       Right eye: No discharge.        Left eye: No discharge.     Extraocular Movements: Extraocular movements intact.     Conjunctiva/sclera: Conjunctivae normal.     Pupils: Pupils are equal, round, and reactive to light.  Neck:     Musculoskeletal: Normal range of motion and neck supple.  Cardiovascular:     Rate and Rhythm: Normal rate and regular rhythm.     Pulses: Normal pulses.     Heart sounds: Normal heart sounds. No murmur. No friction rub. No gallop.   Pulmonary:     Effort: Pulmonary effort is  normal. No respiratory distress.     Breath sounds: Normal breath sounds. No stridor. No wheezing, rhonchi or rales.  Chest:     Chest wall: No tenderness.  Musculoskeletal: Normal range of motion.  Skin:    General: Skin is warm and dry.     Capillary Refill: Capillary refill takes less than 2 seconds.     Coloration: Skin is not jaundiced or pale.     Findings: No bruising, erythema, lesion or rash.  Neurological:     General: No focal deficit present.     Mental Status: He is alert and oriented to person, place, and time. Mental status is at baseline.  Psychiatric:        Mood and Affect: Mood normal.        Behavior: Behavior normal.        Thought Content: Thought content normal.        Judgment: Judgment normal.     Results for orders placed or performed in visit on 02/13/18  Lipid panel  Result Value Ref Range   Cholesterol, Total 223 (H) 100 - 199 mg/dL   Triglycerides 396 (H) 0 - 149 mg/dL   HDL 30 (L) >39 mg/dL   VLDL Cholesterol Cal 79  (H) 5 - 40 mg/dL   LDL Calculated 114 (H) 0 - 99 mg/dL   Chol/HDL Ratio 7.4 (H) 0.0 - 5.0 ratio  Uric acid  Result Value Ref Range   Uric Acid 5.4 3.7 - 8.6 mg/dL  VITAMIN D 25 Hydroxy (Vit-D Deficiency, Fractures)  Result Value Ref Range   Vit D, 25-Hydroxy 43.9 30.0 - 100.0 ng/mL  Basic metabolic panel  Result Value Ref Range   Glucose 93 65 - 99 mg/dL   BUN 11 6 - 24 mg/dL   Creatinine, Ser 1.00 0.76 - 1.27 mg/dL   GFR calc non Af Amer 93 >59 mL/min/1.73   GFR calc Af Amer 108 >59 mL/min/1.73   BUN/Creatinine Ratio 11 9 - 20   Sodium 142 134 - 144 mmol/L   Potassium 4.3 3.5 - 5.2 mmol/L   Chloride 102 96 - 106 mmol/L   CO2 22 20 - 29 mmol/L   Calcium 9.2 8.7 - 10.2 mg/dL      Assessment & Plan:   Problem List Items Addressed This Visit    None    Visit Diagnoses    Atypical pneumonia    -  Primary   Seems to be resolving. Will repeat CXR on Thursday. Call with any concerns.    Relevant Orders   DG Chest 2 View   Costochondritis       Will treat with naproxen and heat. Call with any concerns or if not getting better.        Follow up plan: Return if symptoms worsen or fail to improve.

## 2018-05-03 ENCOUNTER — Ambulatory Visit
Admission: RE | Admit: 2018-05-03 | Discharge: 2018-05-03 | Disposition: A | Discharge status: Home / Self Care | Payer: BLUE CROSS/BLUE SHIELD | Source: Ambulatory Visit | Attending: Family Medicine | Admitting: Family Medicine

## 2018-05-03 DIAGNOSIS — J189 Pneumonia, unspecified organism: Secondary | ICD-10-CM | POA: Diagnosis not present

## 2018-05-04 ENCOUNTER — Telehealth: Payer: Self-pay | Admitting: Family Medicine

## 2018-05-04 NOTE — Telephone Encounter (Signed)
Patient notified

## 2018-05-04 NOTE — Telephone Encounter (Signed)
No more sign of pneumonia. X-ray looks good. Just finish his medicine and let me know if not feeling better.

## 2018-05-16 ENCOUNTER — Ambulatory Visit
Admission: RE | Admit: 2018-05-16 | Discharge: 2018-05-16 | Disposition: A | Discharge status: Home / Self Care | Payer: BLUE CROSS/BLUE SHIELD | Source: Ambulatory Visit | Attending: Family Medicine | Admitting: Family Medicine

## 2018-05-16 ENCOUNTER — Ambulatory Visit: Payer: BLUE CROSS/BLUE SHIELD | Admitting: Family Medicine

## 2018-05-16 ENCOUNTER — Encounter: Payer: Self-pay | Admitting: Family Medicine

## 2018-05-16 VITALS — BP 129/86 | HR 95 | Ht 70.0 in | Wt 252.0 lb

## 2018-05-16 DIAGNOSIS — R05 Cough: Secondary | ICD-10-CM | POA: Insufficient documentation

## 2018-05-16 DIAGNOSIS — J22 Unspecified acute lower respiratory infection: Secondary | ICD-10-CM | POA: Diagnosis not present

## 2018-05-16 DIAGNOSIS — R058 Other specified cough: Secondary | ICD-10-CM

## 2018-05-16 MED ORDER — BENZONATATE 200 MG PO CAPS: 200.0000 mg | ORAL_CAPSULE | Freq: Three times a day (TID) | ORAL | 0 refills | Status: DC | PRN

## 2018-05-16 MED ORDER — DOXYCYCLINE HYCLATE 100 MG PO TABS: 100.0000 mg | ORAL_TABLET | Freq: Two times a day (BID) | ORAL | 0 refills | Status: DC

## 2018-05-16 NOTE — Progress Notes (Signed)
BP 129/86   Pulse 95   Ht 5\' 10"  (1.778 m)   Wt 252 lb (114.3 kg)   SpO2 96%   BMI 36.16 kg/m    Subjective:    Patient ID: Rodney Peters, male    DOB: 1975-07-18, 43 y.o.   MRN: [id number]  HPI: Rodney Peters is a 43 y.o. male  Chief Complaint  Patient presents with  . Cough    Productive  . Eye Problem    Burning  . Sore Throat  . Arm Pain    Left. Constant pain since yesterday morning.   . Chest Pain    W/ cough 8/10   1 week of productive cough, chest tenderness with cough, congestion, SOB, low grade fevers. Has been dealing with atypical pneumonia for the past few weeks and felt like he was improving somewhat with the levaquin but then started worsening after completion of course. Denies CP, N/V/D. Several sick contacts.   Relevant past medical, surgical, family and social history reviewed and updated as indicated. Interim medical history since our last visit reviewed. Allergies and medications reviewed and updated.  Review of Systems  Per HPI unless specifically indicated above     Objective:    BP 129/86   Pulse 95   Ht 5\' 10"  (1.778 m)   Wt 252 lb (114.3 kg)   SpO2 96%   BMI 36.16 kg/m   Wt Readings from Last 3 Encounters:  05/16/18 252 lb (114.3 kg)  05/01/18 259 lb (117.5 kg)  04/24/18 260 lb (117.9 kg)    Physical Exam Vitals signs and nursing note reviewed.  Constitutional:      Appearance: He is well-developed.  HENT:     Head: Atraumatic.     Right Ear: External ear normal.     Left Ear: External ear normal.     Nose: Congestion present.     Mouth/Throat:     Pharynx: Posterior oropharyngeal erythema present. No oropharyngeal exudate.  Eyes:     Conjunctiva/sclera: Conjunctivae normal.     Pupils: Pupils are equal, round, and reactive to light.  Neck:     Musculoskeletal: Normal range of motion and neck supple.  Cardiovascular:     Rate and Rhythm: Normal rate and regular rhythm.  Pulmonary:     Effort: Pulmonary effort is  normal. No respiratory distress.     Breath sounds: Wheezing present. No rales.  Musculoskeletal: Normal range of motion.  Lymphadenopathy:     Cervical: No cervical adenopathy.  Skin:    General: Skin is warm and dry.  Neurological:     Mental Status: He is alert and oriented to person, place, and time.  Psychiatric:        Behavior: Behavior normal.     Results for orders placed or performed in visit on 02/13/18  Lipid panel  Result Value Ref Range   Cholesterol, Total 223 (H) 100 - 199 mg/dL   Triglycerides 396 (H) 0 - 149 mg/dL   HDL 30 (L) >39 mg/dL   VLDL Cholesterol Cal 79 (H) 5 - 40 mg/dL   LDL Calculated 114 (H) 0 - 99 mg/dL   Chol/HDL Ratio 7.4 (H) 0.0 - 5.0 ratio  Uric acid  Result Value Ref Range   Uric Acid 5.4 3.7 - 8.6 mg/dL  VITAMIN D 25 Hydroxy (Vit-D Deficiency, Fractures)  Result Value Ref Range   Vit D, 25-Hydroxy 43.9 30.0 - 100.0 ng/mL  Basic metabolic panel  Result Value Ref Range  Glucose 93 65 - 99 mg/dL   BUN 11 6 - 24 mg/dL   Creatinine, Ser 1.00 0.76 - 1.27 mg/dL   GFR calc non Af Amer 93 >59 mL/min/1.73   GFR calc Af Amer 108 >59 mL/min/1.73   BUN/Creatinine Ratio 11 9 - 20   Sodium 142 134 - 144 mmol/L   Potassium 4.3 3.5 - 5.2 mmol/L   Chloride 102 96 - 106 mmol/L   CO2 22 20 - 29 mmol/L   Calcium 9.2 8.7 - 10.2 mg/dL      Assessment & Plan:   Problem List Items Addressed This Visit    None    Visit Diagnoses    Lower respiratory infection    -  Primary   Start doxycycline, continue mucinex, humidifier, cough medications prn albuterol. Await repeat CXR. Return precautions given   Relevant Orders   DG Chest 2 View (Completed)       Follow up plan: Return if symptoms worsen or fail to improve.

## 2018-05-17 ENCOUNTER — Other Ambulatory Visit: Payer: Self-pay | Admitting: Family Medicine

## 2018-05-17 NOTE — Telephone Encounter (Signed)
Requested medication (s) are due for refill today:   Requested medication (s) are on the active medication list: no  Last refill:  04/24/18 end date 04/25/18  Future visit scheduled: yes  Notes to clinic:  Pt was ordered trazodone on 04/24/18 then was dc'd by psychiatrist- but was discussed in [place]  Please review   Requested Prescriptions  Pending Prescriptions Disp Refills   traZODone (DESYREL) 50 MG tablet [Pharmacy Med Name: TRAZODONE 50 MG TABLET] 30 tablet 0    Sig: Take 0.5-1 tablets (25-50 mg total) by mouth at bedtime as needed for sleep.     Psychiatry: Antidepressants - Serotonin Modulator Passed - 05/17/2018  1:51 AM      Passed - Valid encounter within last 6 months    Recent Outpatient Visits          Yesterday Productive cough   [place], [place], [place]   2 weeks ago Atypical pneumonia   [facility] [place], [place], DO   3 weeks ago Atypical pneumonia   [facility] [place], [place], DO   1 month ago Atypical pneumonia   [facility] [place], Megan P, DO   3 months ago Need for influenza vaccination   [place] [facility], Jeannette How, MD      Future Appointments            In 3 months [facility], Jeannette How, MD [facility], [place]

## 2018-05-26 ENCOUNTER — Other Ambulatory Visit: Payer: Self-pay | Admitting: Family Medicine

## 2018-05-28 NOTE — Telephone Encounter (Signed)
Requested medication (s) are due for refill today: no  Requested medication (s) are on the active medication list: yes  Last refill:  05/17/18 #30  Future visit scheduled: 3 months  Notes to clinic:  Pt requesting 90 day refill on sleep medication   Requested Prescriptions  Pending Prescriptions Disp Refills   traZODone (DESYREL) 50 MG tablet [Pharmacy Med Name: TRAZODONE 50 MG TABLET] 90 tablet 1    Sig: TAKE 0.5-1 TABLETS (25-50 MG TOTAL) BY MOUTH AT BEDTIME AS NEEDED FOR SLEEP.     Psychiatry: Antidepressants - Serotonin Modulator Passed - 05/26/2018 11:47 AM      Passed - Valid encounter within last 6 months    Recent Outpatient Visits          1 week ago Lower respiratory infection   [place], [place], [place]   3 weeks ago Atypical pneumonia   [place], [place], DO   1 month ago Atypical pneumonia   [facility] [place], [place], DO   1 month ago Atypical pneumonia   [facility] [place], Megan P, DO   3 months ago Need for influenza vaccination   [place] [facility], Jeannette How, MD      Future Appointments            In 3 months Crissman, Jeannette How, MD [facility], [facility]

## 2018-07-23 ENCOUNTER — Encounter: Payer: Self-pay | Admitting: Physician Assistant

## 2018-07-23 ENCOUNTER — Telehealth: Payer: BLUE CROSS/BLUE SHIELD | Admitting: Physician Assistant

## 2018-07-23 DIAGNOSIS — J209 Acute bronchitis, unspecified: Secondary | ICD-10-CM | POA: Diagnosis not present

## 2018-07-23 MED ORDER — PREDNISONE 10 MG PO TABS: 10.0000 mg | ORAL_TABLET | Freq: Every day | ORAL | 0 refills | Status: DC

## 2018-07-23 MED ORDER — BENZONATATE 100 MG PO CAPS: 100.0000 mg | ORAL_CAPSULE | Freq: Two times a day (BID) | ORAL | 0 refills | Status: DC | PRN

## 2018-07-23 NOTE — Progress Notes (Signed)
We are sorry that you are not feeling well.  Here is how we plan to help!  Based on your presentation I believe you most likely have acute bronchitis.     In addition you may use A prescription cough medication called Tessalon Perles 100mg . You may take 1-2 capsules every 8 hours as needed for your cough.    Prednisone 10 mg daily for 6 days (see taper instructions below)  Directions for 6 day taper: Day 1: 2 tablets before breakfast, 1 after both lunch & dinner and 2 at bedtime Day 2: 1 tab before breakfast, 1 after both lunch & dinner and 2 at bedtime Day 3: 1 tab at each meal & 1 at bedtime Day 4: 1 tab at breakfast, 1 at lunch, 1 at bedtime Day 5: 1 tab at breakfast & 1 tab at bedtime Day 6: 1 tab at breakfast   Also, because you are experiencing cough and shortness of breath which are symptoms of covid, I have provided a work note for the next 3 days. If symptoms change or worsen, submit an Evisit or see your doctor.   From your responses in the eVisit questionnaire you describe inflammation in the upper respiratory tract which is causing a significant cough.  This is commonly called Bronchitis and has four common causes:    Allergies  Viral Infections  Acid Reflux  Bacterial Infection Allergies, viruses and acid reflux are treated by controlling symptoms or eliminating the cause. An example might be a cough caused by taking certain blood pressure medications. You stop the cough by changing the medication. Another example might be a cough caused by acid reflux. Controlling the reflux helps control the cough.  USE OF BRONCHODILATOR ("RESCUE") INHALERS: There is a risk from using your bronchodilator too frequently.  The risk is that over-reliance on a medication which only relaxes the muscles surrounding the breathing tubes can reduce the effectiveness of medications prescribed to reduce swelling and congestion of the tubes themselves.  Although you feel brief relief from the  bronchodilator inhaler, your asthma may actually be worsening with the tubes becoming more swollen and filled with mucus.  This can delay other crucial treatments, such as oral steroid medications. If you need to use a bronchodilator inhaler daily, several times per day, you should discuss this with your provider.  There are probably better treatments that could be used to keep your asthma under control.     HOME CARE . Only take medications as instructed by your medical team. . Complete the entire course of an antibiotic. . Drink plenty of fluids and get plenty of rest. . Avoid close contacts especially the very young and the elderly . Cover your mouth if you cough or cough into your sleeve. . Always remember to wash your hands . A steam or ultrasonic humidifier can help congestion.   GET HELP RIGHT AWAY IF: . You develop worsening fever. . You become short of breath . You cough up blood. . Your symptoms persist after you have completed your treatment plan MAKE SURE YOU   Understand these instructions.  Will watch your condition.  Will get help right away if you are not doing well or get worse.  Your e-visit answers were reviewed by a board certified advanced clinical practitioner to complete your personal care plan.  Depending on the condition, your plan could have included both over the counter or prescription medications. If there is a problem please reply  once you have received a  response from your provider. Your safety is important to [place].  If you have drug allergies check your prescription carefully.    You can use MyChart to ask questions about today's visit, request a non-urgent call back, or ask for a work or school excuse for 24 hours related to this e-Visit. If it has been greater than 24 hours you will need to follow up with your provider, or enter a new e-Visit to address those concerns. You will get an e-mail in the next two days asking about your experience.  I hope that  your e-visit has been valuable and will speed your recovery. Thank you for using e-visits.  I have spent 7 min in completion and review of this note- Lacy Duverney [facility]

## 2018-07-30 ENCOUNTER — Ambulatory Visit: Payer: Self-pay | Admitting: Family Medicine

## 2018-07-30 NOTE — Telephone Encounter (Signed)
Pt. Reports he has had a cough since Feb. Has been on antibiotics and steroid. Felt "a little better in April, but now the cough is very painful. " Has a stabbing pain to the left of his sternum when he coughs. Productive cough with thick dark yellow/green mucus. Denies fever. Occasionally hears wheezing. Had an E-visit 07/25/18. Given Tessalon and a steroid. Has not taken the steroid. "It didn't help me last time." Spoke with Cyril Mourning and will forward triage note. Please advise pt.  Answer Assessment - Initial Assessment Questions 1. ONSET: "When did the cough begin?"      Feb. 2. SEVERITY: "How bad is the cough today?"      Moderate 3. RESPIRATORY DISTRESS: "Describe your breathing."      Gets short of breath with coughing 4. FEVER: "Do you have a fever?" If so, ask: "What is your temperature, how was it measured, and when did it start?"     No 5. SPUTUM: "Describe the color of your sputum" (clear, white, yellow, green)     Dark yellow/green thick  6. HEMOPTYSIS: "Are you coughing up any blood?" If so ask: "How much?" (flecks, streaks, tablespoons, etc.)     No 7. CARDIAC HISTORY: "Do you have any history of heart disease?" (e.g., heart attack, congestive heart failure)      No 8. LUNG HISTORY: "Do you have any history of lung disease?"  (e.g., pulmonary embolus, asthma, emphysema)     No 9. PE RISK FACTORS: "Do you have a history of blood clots?" (or: recent major surgery, recent prolonged travel, bedridden)     No 10. OTHER SYMPTOMS: "Do you have any other symptoms?" (e.g., runny nose, wheezing, chest pain)       Chest pain - right of his breast bone, wheezing at intervals 11. PREGNANCY: "Is there any chance you are pregnant?" "When was your last menstrual period?"       N/a 12. TRAVEL: "Have you traveled out of the country in the last month?" (e.g., travel history, exposures)       No  Protocols used: [place]

## 2018-07-30 NOTE — Telephone Encounter (Signed)
Needs visit if not improved, but would advise the steroid to see if that will help first (even if it didn't help previously)

## 2018-07-30 NOTE — Telephone Encounter (Signed)
Virtual or UC?

## 2018-07-30 NOTE — Telephone Encounter (Signed)
Virtual unless severe sxs, which would require UC or ED immediately

## 2018-07-31 NOTE — Telephone Encounter (Signed)
Spoke with patient. He states he is feeling the same. Offered pt a virtual visit and he declined "unless they are going to offer a chest Xray, I don't see how we can get more information otherwise". Pt encouraged to start steroid, pt stated he would try it. When pt was advised that we would call back about his chest Xray request, he stated it was not a request he just didn't know how else a virtual visit could help. Pt was advised to seek help at [facility] or ER if symptoms become severe.

## 2018-08-07 ENCOUNTER — Other Ambulatory Visit: Payer: Self-pay

## 2018-08-07 ENCOUNTER — Ambulatory Visit (INDEPENDENT_AMBULATORY_CARE_PROVIDER_SITE_OTHER): Payer: BLUE CROSS/BLUE SHIELD | Admitting: Family Medicine

## 2018-08-07 ENCOUNTER — Encounter: Payer: Self-pay | Admitting: Family Medicine

## 2018-08-07 DIAGNOSIS — R05 Cough: Secondary | ICD-10-CM | POA: Diagnosis not present

## 2018-08-07 DIAGNOSIS — R059 Cough, unspecified: Secondary | ICD-10-CM

## 2018-08-07 MED ORDER — OMEPRAZOLE 20 MG PO CPDR: 20.0000 mg | DELAYED_RELEASE_CAPSULE | Freq: Every day | ORAL | 3 refills | Status: DC

## 2018-08-07 MED ORDER — FLUTICASONE PROPIONATE 50 MCG/ACT NA SUSP: 2.0000 | Freq: Every day | NASAL | 6 refills | Status: DC

## 2018-08-07 NOTE — Progress Notes (Signed)
There were no vitals taken for this visit.   Subjective:    Patient ID: Rodney Peters, male    DOB: 04/15/1975, 43 y.o.   MRN: [id number]  HPI: Rodney Peters is a 43 y.o. male  Cough  Telemedicine using audio/video telecommunications for a synchronous communication visit. Today's visit due to COVID-19 isolation precautions I connected with and verified that I am speaking with the correct person using two identifiers.   I discussed the limitations, risks, security and privacy concerns of performing an evaluation and management service by telecommunication and the availability of in person appointments. I also discussed with the patient that there may be a patient responsible charge related to this service. The patient expressed understanding and agreed to proceed. The patient's location is home. I am at home.  Discussion with patient regarding patient's ongoing chronic cough which is really gone on all year.  Patient's been on round of antibiotics in February with no real relief had another chest x-ray in February with no real identified lesions.  Then subsequently in May had another visit for severe coughing was given antibiotics prednisone and cough medicine with no real relief and now is still coughing with productive and very hard coughing. Total of 2 rounds of antibiotics and 1 of prednisone , occ albuterol use and tessalon still productive. With deep productive cough.  Relevant past medical, surgical, family and social history reviewed and updated as indicated. Interim medical history since our last visit reviewed. Allergies and medications reviewed and updated.  Review of Systems  Constitutional: Negative.   Respiratory: Positive for cough, choking and wheezing. Negative for shortness of breath.   Cardiovascular: Negative.     Per HPI unless specifically indicated above     Objective:    There were no vitals taken for this visit.  Wt Readings from Last 3 Encounters:   05/16/18 252 lb (114.3 kg)  05/01/18 259 lb (117.5 kg)  04/24/18 260 lb (117.9 kg)    Physical Exam  Results for orders placed or performed in visit on 02/13/18  Lipid panel  Result Value Ref Range   Cholesterol, Total 223 (H) 100 - 199 mg/dL   Triglycerides 396 (H) 0 - 149 mg/dL   HDL 30 (L) >39 mg/dL   VLDL Cholesterol Cal 79 (H) 5 - 40 mg/dL   LDL Calculated 114 (H) 0 - 99 mg/dL   Chol/HDL Ratio 7.4 (H) 0.0 - 5.0 ratio  Uric acid  Result Value Ref Range   Uric Acid 5.4 3.7 - 8.6 mg/dL  VITAMIN D 25 Hydroxy (Vit-D Deficiency, Fractures)  Result Value Ref Range   Vit D, 25-Hydroxy 43.9 30.0 - 100.0 ng/mL  Basic metabolic panel  Result Value Ref Range   Glucose 93 65 - 99 mg/dL   BUN 11 6 - 24 mg/dL   Creatinine, Ser 1.00 0.76 - 1.27 mg/dL   GFR calc non Af Amer 93 >59 mL/min/1.73   GFR calc Af Amer 108 >59 mL/min/1.73   BUN/Creatinine Ratio 11 9 - 20   Sodium 142 134 - 144 mmol/L   Potassium 4.3 3.5 - 5.2 mmol/L   Chloride 102 96 - 106 mmol/L   CO2 22 20 - 29 mmol/L   Calcium 9.2 8.7 - 10.2 mg/dL      Assessment & Plan:   Problem List Items Addressed This Visit      Other   Cough in adult    Ongoing productive cough for 5+ months treated with antibiotics  and prednisone with no real relief.  Chest x-rays showing bronchitis. We will treat with omeprazole, fluticasone. We will obtain chest CT and pulmonary referral.      Relevant Orders   CT Chest W Contrast   CT CHEST WO CONTRAST   Ambulatory referral to Pulmonology     I discussed the assessment and treatment plan with the patient. The patient was provided an opportunity to ask questions and all were answered. The patient agreed with the plan and demonstrated an understanding of the instructions.   The patient was advised to call back or seek an in-person evaluation if the symptoms worsen or if the condition fails to improve as anticipated.   I provided 21+ minutes of time during this encounter.  Follow up  plan: Return if symptoms worsen or fail to improve, for As scheduled.

## 2018-08-07 NOTE — Assessment & Plan Note (Signed)
Ongoing productive cough for 5+ months treated with antibiotics and prednisone with no real relief.  Chest x-rays showing bronchitis. We will treat with omeprazole, fluticasone. We will obtain chest CT and pulmonary referral.

## 2018-08-14 ENCOUNTER — Encounter: Payer: Self-pay | Admitting: Pulmonary Disease

## 2018-08-14 ENCOUNTER — Other Ambulatory Visit: Payer: Self-pay

## 2018-08-14 ENCOUNTER — Ambulatory Visit: Payer: BLUE CROSS/BLUE SHIELD | Admitting: Pulmonary Disease

## 2018-08-14 VITALS — BP 154/100 | HR 100 | Temp 98.1°F | Ht 70.0 in | Wt 272.4 lb

## 2018-08-14 DIAGNOSIS — K219 Gastro-esophageal reflux disease without esophagitis: Secondary | ICD-10-CM

## 2018-08-14 DIAGNOSIS — R05 Cough: Secondary | ICD-10-CM

## 2018-08-14 DIAGNOSIS — F17200 Nicotine dependence, unspecified, uncomplicated: Secondary | ICD-10-CM

## 2018-08-14 DIAGNOSIS — Z8709 Personal history of other diseases of the respiratory system: Secondary | ICD-10-CM

## 2018-08-14 DIAGNOSIS — R053 Chronic cough: Secondary | ICD-10-CM

## 2018-08-14 DIAGNOSIS — J302 Other seasonal allergic rhinitis: Secondary | ICD-10-CM

## 2018-08-14 MED ORDER — OMEPRAZOLE 40 MG PO CPDR: 40.0000 mg | DELAYED_RELEASE_CAPSULE | Freq: Every day | ORAL | 10 refills | Status: DC

## 2018-08-14 MED ORDER — NAPROXEN 500 MG PO TABS: 500.0000 mg | ORAL_TABLET | Freq: Two times a day (BID) | ORAL | 0 refills | Status: DC | PRN

## 2018-08-14 MED ORDER — LORATADINE 10 MG PO TABS: 10.0000 mg | ORAL_TABLET | Freq: Every day | ORAL | 11 refills | Status: DC

## 2018-08-14 MED ORDER — FLUTICASONE PROPIONATE HFA 110 MCG/ACT IN AERO: 2.0000 | INHALATION_SPRAY | Freq: Two times a day (BID) | RESPIRATORY_TRACT | 10 refills | Status: DC

## 2018-08-14 NOTE — Progress Notes (Signed)
PULMONARY CONSULT NOTE  Requesting MD/Service: Golden Pop, MD Date of initial consultation: 08/14/18 Reason for consultation: chronic cough  PT PROFILE: 43 y.o. male former regular light smoker, now occasional smoker/vaper (not every day) referred for evaluation of chronic cough  DATA:  INTERVAL:  HPI:  As above.  He reports that his cough has been present since December 2019 at which time he developed a "typical cold" with fever, malaise, cough.  Since that time all symptoms have resolved except cough.  He describes paroxysms of cough which have varied in frequency and intensity over the past several months.  His cough is sometimes so severe that he develops post tussive emesis.  It is minimally productive of scant "phlegm".  He denies hemoptysis, pleuritic chest pain, fever.  In early February he underwent a chest x-ray which reportedly demonstrated "bronchitis" for which he was treated with antibiotics.  Antibiotic therapy provided minimal improvement.  He has been on a second course of antibiotics and has undergone a short course of prednisone, again with short-lived improvement.  He does report reflux symptoms which are worsening over time.  He has a history of seasonal allergies.  He has a history of possible childhood asthma.  He was never formally diagnosed or treated for asthma.  His cough can be exacerbated by talking or laughing.  It does not seem to be exacerbated by changes in weather.  He has found no alleviating factors.  He has been prescribed Tessalon Perles which might be modestly beneficial.  He has been prescribed omeprazole and Flonase which she has not filled.  Previously he smoked at most 1 pack/week.  Presently, he smokes or vapes infrequently-only when with friends.  He has no significant occupational exposures.  However, he notes at work that they have recently changed to a new office environment.  He also notes that he has a new dog in the home since October 2019.  He  was never in the [employer] and has no significant travel history.  He has no history of exposure to tuberculosis.  He denies CP, fever, purulent sputum, hemoptysis, orthopnea, paroxysmal nocturnal dyspnea, LE edema and calf tenderness.  Past Medical History:  Diagnosis Date  . Abnormal TSH   . Anxiety   . Bipolar disorder ([place])   . GERD (gastroesophageal reflux disease)   . History of mononucleosis   . Hyperglycemia   . Lymphocytosis   . Vitamin B 12 deficiency   . Vitamin D deficiency     History reviewed. No pertinent surgical history.  MEDICATIONS: I have reviewed all medications and confirmed regimen as documented  Social History   Socioeconomic History  . Marital status: Married    Spouse name: Not on file  . Number of children: Not on file  . Years of education: Not on file  . Highest education level: Not on file  Occupational History  . Not on file  Social Needs  . Financial resource strain: Not on file  . Food insecurity:    Worry: Not on file    Inability: Not on file  . Transportation needs:    Medical: Not on file    Non-medical: Not on file  Tobacco Use  . Smoking status: Former Smoker    Types: Cigarettes    Start date: 1998    Last attempt to quit: 12/19/2012    Years since quitting: 5.6  . Smokeless tobacco: Former Systems developer    Types: Chew    Quit date: 2010  . Tobacco comment:  would smoke 2 packs every 3 weeks, currently 1 a week or so  Substance and Sexual Activity  . Alcohol use: Yes    Alcohol/week: 6.0 standard drinks    Types: 6 Standard drinks or equivalent per week    Comment: weekly  . Drug use: No  . Sexual activity: Not on file  Lifestyle  . Physical activity:    Days per week: Not on file    Minutes per session: Not on file  . Stress: Not on file  Relationships  . Social connections:    Talks on phone: Not on file    Gets together: Not on file    Attends religious service: Not on file    Active member of club or organization: Not  on file    Attends meetings of clubs or organizations: Not on file    Relationship status: Not on file  . Intimate partner violence:    Fear of current or ex partner: Not on file    Emotionally abused: Not on file    Physically abused: Not on file    Forced sexual activity: Not on file  Other Topics Concern  . Not on file  Social History Narrative  . Not on file    Family History  Problem Relation Age of Onset  . Anxiety disorder Father   . COPD Father   . Other Sister        substance abuse  . Cancer Maternal Grandmother   . Hypertension Maternal Grandfather   . Cancer Paternal Grandfather     ROS: No fever, myalgias/arthralgias, unexplained weight loss or weight gain No new focal weakness or sensory deficits No otalgia, hearing loss, visual changes, nasal and sinus symptoms, mouth and throat problems No neck pain or adenopathy No abdominal pain, N/V/D, diarrhea, change in bowel pattern No dysuria, change in urinary pattern   Vitals:   08/14/18 0841 08/14/18 0853  BP:  (!) 154/100  Pulse:  100  Temp:  98.1 F (36.7 C)  TempSrc:  Oral  SpO2:  97%  Weight: 272 lb 6.4 oz (123.6 kg)   Height: 5\' 10"  (1.778 m)    Room air  EXAM:  Gen: Moderately obese, No overt respiratory distress, infrequent cough during this encounter HEENT: NCAT, sclera white, oropharynx normal Neck: Supple without LAN, thyromegaly, JVD Lungs: breath sounds full, percussion normal, adventitious sounds: None Cardiovascular: RRR, no murmurs noted Abdomen: Soft, nontender, normal BS Ext: without clubbing, cyanosis, edema Neuro: CNs grossly intact, motor and sensory intact Skin: Limited exam, no lesions noted  DATA:   BMP Latest Ref Rng & Units 02/13/2018 08/16/2016 11/11/2015  Glucose 65 - 99 mg/dL 93 94 111(H)  BUN 6 - 24 mg/dL 11 18 11   Creatinine 0.76 - 1.27 mg/dL 1.00 1.14 0.95  BUN/Creat Ratio 9 - 20 11 16 12   Sodium 134 - 144 mmol/L 142 139 140  Potassium 3.5 - 5.2 mmol/L 4.3 3.8 4.1   Chloride 96 - 106 mmol/L 102 99 97  CO2 20 - 29 mmol/L 22 24 26   Calcium 8.7 - 10.2 mg/dL 9.2 8.9 9.6    CBC Latest Ref Rng & Units 08/16/2016 08/13/2015  WBC 3.4 - 10.8 x10E3/uL 9.0 11.1(H)  Hemoglobin 13.0 - 17.7 g/dL 15.7 15.3  Hematocrit 37.5 - 51.0 % 45.6 44.3  Platelets 150 - 379 x10E3/uL 220 242    CXR 05/16/18:  No acute or chronic pulmonary or cardiac findings  I have personally reviewed all chest radiographs reported above  including CXRs and CT chest unless otherwise indicated  IMPRESSION:     ICD-10-CM   1. Chronic cough R05   2. Smoker (minimal) F17.200   3. GERD K21.9   4. Seasonal allergies J30.2   5. History of possible asthma Z87.09      PLAN:  No smoking or vaping at all!   Avoid secondhand smoke and other irritating inhalations Begin omeprazole 40 mg daily taken between dinner and bedtime Begin Flovent 110 mcg, 2 inhalations twice a day.  Rinse mouth thoroughly after use Continue Claritin 10 mg daily Continue albuterol inhaler as needed for increased shortness of breath, wheezing, chest tightness, cough Continue Tessalon Perles as needed for cough suppression Follow-up in 6 weeks for recheck.  Call sooner if needed At some point we will need to obtain lung function measurement (pulmonary function test)   Merton Border, MD PCCM service Mobile [phone] Pager [phone] 08/15/2018 10:31 AM

## 2018-08-14 NOTE — Patient Instructions (Addendum)
No smoking or vaping at all!   Avoid secondhand smoke and other irritating inhalations Begin omeprazole 40 mg daily taken between dinner and bedtime Begin Flovent 110 mcg, 2 inhalations twice a day.  Rinse mouth thoroughly after use Continue Claritin 10 mg daily Continue albuterol inhaler as needed for increased shortness of breath, wheezing, chest tightness, cough Continue Tessalon Perles as needed for cough suppression Follow-up in 6 weeks for recheck.  Call sooner if needed At some point we will need to obtain lung function measurement (pulmonary function test)

## 2018-08-17 ENCOUNTER — Ambulatory Visit: Payer: BLUE CROSS/BLUE SHIELD

## 2018-08-27 ENCOUNTER — Encounter: Payer: BLUE CROSS/BLUE SHIELD | Admitting: Family Medicine

## 2018-10-02 ENCOUNTER — Other Ambulatory Visit: Payer: Self-pay | Admitting: Psychiatry

## 2018-10-04 ENCOUNTER — Ambulatory Visit: Payer: BLUE CROSS/BLUE SHIELD | Admitting: Pulmonary Disease

## 2018-10-11 ENCOUNTER — Ambulatory Visit: Payer: BLUE CROSS/BLUE SHIELD | Admitting: Urology

## 2018-10-12 ENCOUNTER — Telehealth: Payer: BLUE CROSS/BLUE SHIELD

## 2018-10-12 ENCOUNTER — Ambulatory Visit (INDEPENDENT_AMBULATORY_CARE_PROVIDER_SITE_OTHER)
Admission: RE | Admit: 2018-10-12 | Discharge: 2018-10-12 | Disposition: A | Discharge status: Home / Self Care | Payer: BLUE CROSS/BLUE SHIELD | Source: Ambulatory Visit

## 2018-10-12 DIAGNOSIS — R110 Nausea: Secondary | ICD-10-CM

## 2018-10-12 DIAGNOSIS — Z20822 Contact with and (suspected) exposure to covid-19: Secondary | ICD-10-CM

## 2018-10-12 DIAGNOSIS — R05 Cough: Secondary | ICD-10-CM | POA: Diagnosis not present

## 2018-10-12 DIAGNOSIS — M791 Myalgia, unspecified site: Secondary | ICD-10-CM

## 2018-10-12 DIAGNOSIS — R438 Other disturbances of smell and taste: Secondary | ICD-10-CM

## 2018-10-12 DIAGNOSIS — R51 Headache: Secondary | ICD-10-CM

## 2018-10-12 DIAGNOSIS — Z20828 Contact with and (suspected) exposure to other viral communicable diseases: Secondary | ICD-10-CM

## 2018-10-12 NOTE — Discharge Instructions (Addendum)
Most likely COVID-19 based on symptoms We will give you a work note so you can work from home next week and then return in person the week after that if symptoms have resolved and fever free.  Follow up as needed for continued or worsening symptoms

## 2018-10-12 NOTE — ED Provider Notes (Signed)
Virtual Visit via Video Note:  Rodney Peters  initiated request for Telemedicine visit with [facility] team. I connected with Rodney Peters  on 10/15/2018 at 8:01 AM  for a synchronized telemedicine visit using a video enabled HIPPA compliant telemedicine application. I verified that I am speaking with Rodney Peters  using two identifiers. Orvan July, NP  was physically located in a [facility] Urgent care site and Rodney Peters was located at a different location.   The limitations of evaluation and management by telemedicine as well as the availability of in-person appointments were discussed. Patient was informed that he  may incur a bill ( including co-pay) for this virtual visit encounter. Rodney Peters  expressed understanding and gave verbal consent to proceed with virtual visit.     History of Present Illness:Rodney Peters  is a 43 y.o. adult presents with body aches, headaches, loss of taste and smell, nausea, cough.  Symptoms have been constant over the past 3 to 4 days.  He has been using over-the-counter medication for symptoms.  Concerned about COVID.  Denies any known fevers but has had some chills.  No known sick contacts.  Past Medical History:  Diagnosis Date  . Abnormal TSH   . Anxiety   . Bipolar disorder ([place])   . GERD (gastroesophageal reflux disease)   . History of mononucleosis   . Hyperglycemia   . Lymphocytosis   . Vitamin B 12 deficiency   . Vitamin D deficiency     No Known Allergies      Observations/Objective:VITALS: Per patient if applicable, see vitals. GENERAL: Alert, appears well and in no acute distress. HEENT: Atraumatic, conjunctiva clear, no obvious abnormalities on inspection of external nose and ears. NECK: Normal movements of the head and neck. CARDIOPULMONARY: No increased WOB. Speaking in clear sentences. I:E ratio WNL.  MS: Moves all visible extremities without noticeable abnormality. PSYCH: Pleasant and  cooperative, well-groomed. Speech normal rate and rhythm. Affect is appropriate. Insight and judgement are appropriate. Attention is focused, linear, and appropriate.  NEURO: CN grossly intact. Oriented as arrived to appointment on time with no prompting. Moves both UE equally.  SKIN: No obvious lesions, wounds, erythema, or cyanosis noted on face or hands.     Assessment and Plan: Sending for COVID testing Precautions given, work note given Over-the-counter symptomatic treatment If he develops any chest pain, shortness of breath you need to go to the ER.   Follow Up Instructions: Follow up as needed for continued or worsening symptoms Go to the ER for worsening symptoms.     I discussed the assessment and treatment plan with the patient. The patient was provided an opportunity to ask questions and all were answered. The patient agreed with the plan and demonstrated an understanding of the instructions.   The patient was advised to call back or seek an in-person evaluation if the symptoms worsen or if the condition fails to improve as anticipated.     Orvan July, NP  10/15/2018 8:01 AM         Orvan July, NP 10/15/18 5809

## 2018-10-13 ENCOUNTER — Other Ambulatory Visit: Payer: Self-pay | Admitting: Psychiatry

## 2018-10-15 ENCOUNTER — Ambulatory Visit: Payer: BLUE CROSS/BLUE SHIELD | Admitting: Urology

## 2018-10-19 ENCOUNTER — Other Ambulatory Visit: Payer: Self-pay | Admitting: Psychiatry

## 2018-10-19 DIAGNOSIS — F422 Mixed obsessional thoughts and acts: Secondary | ICD-10-CM

## 2018-10-30 ENCOUNTER — Encounter: Payer: Self-pay | Admitting: Psychiatry

## 2018-10-30 ENCOUNTER — Ambulatory Visit (INDEPENDENT_AMBULATORY_CARE_PROVIDER_SITE_OTHER): Payer: BLUE CROSS/BLUE SHIELD | Admitting: Psychiatry

## 2018-10-30 ENCOUNTER — Other Ambulatory Visit: Payer: Self-pay

## 2018-10-30 DIAGNOSIS — F422 Mixed obsessional thoughts and acts: Secondary | ICD-10-CM

## 2018-10-30 DIAGNOSIS — F3181 Bipolar II disorder: Secondary | ICD-10-CM

## 2018-10-30 DIAGNOSIS — F411 Generalized anxiety disorder: Secondary | ICD-10-CM | POA: Diagnosis not present

## 2018-10-30 MED ORDER — DIVALPROEX SODIUM ER 500 MG PO TB24: 1000.0000 mg | ORAL_TABLET | Freq: Every day | ORAL | 1 refills | Status: DC

## 2018-10-30 MED ORDER — FLUVOXAMINE MALEATE 100 MG PO TABS: 100.0000 mg | ORAL_TABLET | Freq: Every day | ORAL | 1 refills | Status: DC

## 2018-10-30 MED ORDER — LAMOTRIGINE 25 MG PO TABS: 50.0000 mg | ORAL_TABLET | Freq: Every day | ORAL | 1 refills | Status: DC

## 2018-10-30 NOTE — Progress Notes (Signed)
Rodney Peters [id number] [birth year] 43 y.o.  Subjective:   Patient ID:  Rodney Peters is a 43 y.o. (DOB October 09, 1975) adult.  Chief Complaint:  Chief Complaint  Patient presents with  . Follow-up    bipolar and OCD   HPI [name] presents to the office today for follow-up of bipolar and anxiety.  Last visit was in February with no med changes.  He was doing reasonably well with rapid cycling bipolar disorder and OCD with Depakote and fluvoxamine.  Been doing fantastic.  Really pleased with meds.  Stressful several months with Covid and I've been fine.  Managing well.   Wife notices  And thinks that fluvoxamine has calmed him into being less bullying and less likely to argue or let the other person win an argument and wasn't that way in the past.   Wife a little nervous about the change and said he was checked out.  He says that's not true and that he's more checked in but less driven to  Prove himself to others.  Much more at ease.  Less obsessing over emails.  Quicker with work.  Feels less pressure. Feels overall a lot better, much calmer, and even better than last time.  Drowsiness still annoying but manageable.  Benefit from Luvox added (on 100mg  for awhile), despite external stressors being tough.  Has seen some improvement is OCD.  Less rewriting.  Less anxiety around it.  Had severe panic attack, brutal, unusual after 4 days of half dose Depakote.  Patient reports stable mood and denies depressed or irritable moods. Mood swings managed. Patient reorts difficulty with anxiety but it's better.  OCD is better not gone.  Patient denies difficulty with sleep initiation or maintenance but vivid disturbing dreams. Denies appetite disturbance.  Patient reports that energy and motivation have been good.  Patient denies any difficulty with concentration other than distraction from OCD.  Patient denies any suicidal ideation.  Clear benefit from each of the meds added.  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  Wife complains that fluvoxamine seems to make him a little more curt and apt to be dismissive of disagreements.   Review of Systems:  Review of Systems  Neurological: Negative for tremors and weakness.  Psychiatric/Behavioral: Negative for agitation, behavioral problems, confusion, decreased concentration, dysphoric mood, hallucinations, self-injury, sleep disturbance and suicidal ideas. The patient is not nervous/anxious and is not hyperactive.     Medications: I have reviewed the patient's current medications.  Current Outpatient Medications  Medication Sig Dispense Refill  . allopurinol (ZYLOPRIM) 300 MG tablet Take 1 tablet (300 mg total) by mouth daily. 90 tablet 3  . colchicine 0.6 MG tablet Take 1 tablet (0.6 mg total) by mouth daily. May take an extra tab for flair 30 tablet 4  . divalproex (DEPAKOTE ER) 500 MG 24 hr tablet TAKE 1 TABLET (500 MG TOTAL) BY MOUTH DAILY. AT NIGHT (Patient taking differently: Take 1,000 mg by mouth daily. ) 90 tablet 1  . fluticasone (FLOVENT HFA) 110 MCG/ACT inhaler Inhale 2 puffs into the lungs 2 (two) times daily. Rinse mouth thoroughly after use 1 Inhaler 10  . fluvoxaMINE (LUVOX) 100 MG tablet TAKE 1 TABLET BY MOUTH EVERYDAY AT BEDTIME 90 tablet 1  . lamoTRIgine (LAMICTAL) 25 MG tablet TAKE 2 TABLETS BY MOUTH EVERY DAY 180 tablet 0  . loratadine (CLARITIN) 10 MG tablet Take 1 tablet (10 mg total) by mouth daily. 30 tablet 11  .  Multiple Vitamin (MULTIVITAMIN) tablet Take 1 tablet by mouth daily.    Marland Kitchen omeprazole (PRILOSEC) 40 MG capsule Take 1 capsule (40 mg total) by mouth daily. Take at dinner time 30 capsule 10  . VITAMIN D, CHOLECALCIFEROL, PO Take 5,000 Units by mouth daily.     Marland Kitchen albuterol (PROVENTIL HFA;VENTOLIN HFA) 108 (90 Base) MCG/ACT inhaler Inhale 2 puffs into the lungs every 6 (six) hours as needed for wheezing or shortness of breath. 1 Inhaler 0  . benzonatate (TESSALON) 200 MG  capsule Take 1 capsule (200 mg total) by mouth 3 (three) times daily as needed. 40 capsule 0  . chlorpheniramine-HYDROcodone (TUSSIONEX PENNKINETIC ER) 10-8 MG/5ML SUER Take 5 mLs by mouth at bedtime as needed. 140 mL 0  . naproxen (NAPROSYN) 500 MG tablet Take 1 tablet (500 mg total) by mouth 2 (two) times daily as needed for mild pain or moderate pain. 60 tablet 0  . traZODone (DESYREL) 50 MG tablet TAKE 0.5-1 TABLETS (25-50 MG TOTAL) BY MOUTH AT BEDTIME AS NEEDED FOR SLEEP. 90 tablet 1   No current facility-administered medications for this visit.     Medication Side Effects: Sedation manageable. Vivid dreaming from lamotrigine is some better.  Wouldn't trade the gains for the drowsiness.  Allergies: No Known Allergies  Past Medical History:  Diagnosis Date  . Abnormal TSH   . Anxiety   . Bipolar disorder ([place])   . GERD (gastroesophageal reflux disease)   . History of mononucleosis   . Hyperglycemia   . Lymphocytosis   . Vitamin B 12 deficiency   . Vitamin D deficiency     Family History  Problem Relation Age of Onset  . Anxiety disorder Father   . COPD Father   . Other Sister        substance abuse  . Cancer Maternal Grandmother   . Hypertension Maternal Grandfather   . Cancer Paternal Grandfather     Social History   Socioeconomic History  . Marital status: Married    Spouse name: Not on file  . Number of children: Not on file  . Years of education: Not on file  . Highest education level: Not on file  Occupational History  . Not on file  Social Needs  . Financial resource strain: Not on file  . Food insecurity    Worry: Not on file    Inability: Not on file  . Transportation needs    Medical: Not on file    Non-medical: Not on file  Tobacco Use  . Smoking status: Former Smoker    Types: Cigarettes    Start date: 1998    Quit date: 12/19/2012    Years since quitting: 5.8  . Smokeless tobacco: Former Systems developer    Types: Chew    Quit date: 2010  . Tobacco  comment: would smoke 2 packs every 3 weeks, currently 1 a week or so  Substance and Sexual Activity  . Alcohol use: Yes    Alcohol/week: 6.0 standard drinks    Types: 6 Standard drinks or equivalent per week    Comment: weekly  . Drug use: No  . Sexual activity: Not on file  Lifestyle  . Physical activity    Days per week: Not on file    Minutes per session: Not on file  . Stress: Not on file  Relationships  . Social Herbalist on phone: Not on file    Gets together: Not on file    Attends  religious service: Not on file    Active member of club or organization: Not on file    Attends meetings of clubs or organizations: Not on file    Relationship status: Not on file  . Intimate partner violence    Fear of current or ex partner: Not on file    Emotionally abused: Not on file    Physically abused: Not on file    Forced sexual activity: Not on file  Other Topics Concern  . Not on file  Social History Narrative  . Not on file    Past Medical History, Surgical history, Social history, and Family history were reviewed and updated as appropriate.   Involved in church.  Please see review of systems for further details on the patient's review from today.   Objective:   Physical Exam:  There were no vitals taken for this visit.  Physical Exam Constitutional:      General: He is not in acute distress.    Appearance: He is well-developed.  Musculoskeletal:        General: No deformity.  Neurological:     Mental Status: He is alert and oriented to person, place, and time.     Motor: No tremor.     Coordination: Coordination normal.     Gait: Gait normal.  Psychiatric:        Attention and Perception: He is attentive.        Mood and Affect: Mood is not anxious or depressed. Affect is not labile, blunt, angry or inappropriate.        Speech: Speech normal.        Behavior: Behavior normal.        Thought Content: Thought content normal. Thought content does not  include homicidal or suicidal ideation. Thought content does not include homicidal or suicidal plan.        Judgment: Judgment normal.     Comments: Insight intact. No auditory or visual hallucinations. OCD present but manageable.  Much calmer than he's ever been.     Lab Review:     Component Value Date/Time   NA 142 02/13/2018 0949   K 4.3 02/13/2018 0949   CL 102 02/13/2018 0949   CO2 22 02/13/2018 0949   GLUCOSE 93 02/13/2018 0949   BUN 11 02/13/2018 0949   CREATININE 1.00 02/13/2018 0949   CALCIUM 9.2 02/13/2018 0949   PROT 6.5 08/16/2016 0856   ALBUMIN 4.4 08/16/2016 0856   AST 20 08/16/2016 0856   ALT 25 08/16/2016 0856   ALKPHOS 51 08/16/2016 0856   BILITOT 0.4 08/16/2016 0856   GFRNONAA 93 02/13/2018 0949   GFRAA 108 02/13/2018 0949       Component Value Date/Time   WBC 9.0 08/16/2016 0856   RBC 5.40 08/16/2016 0856   HGB 15.7 08/16/2016 0856   HCT 45.6 08/16/2016 0856   PLT 220 08/16/2016 0856   MCV 84 08/16/2016 0856   MCH 29.1 08/16/2016 0856   MCHC 34.4 08/16/2016 0856   RDW 14.1 08/16/2016 0856   LYMPHSABS 3.6 (H) 08/16/2016 0856   EOSABS 0.4 08/16/2016 0856   BASOSABS 0.0 08/16/2016 0856    No results found for: POCLITH, LITHIUM   No results found for: PHENYTOIN, PHENOBARB, VALPROATE, CBMZ   Remote VPA 63 on 750mg /d Oct 2018 and LFT's stable.  .res Assessment: Plan:    Alson was seen today for follow-up.  Diagnoses and all orders for this visit:  Bipolar II disorder ([place]) -  divalproex (DEPAKOTE ER) 500 MG 24 hr tablet; Take 2 tablets (1,000 mg total) by mouth at bedtime. -     lamoTRIgine (LAMICTAL) 25 MG tablet; Take 2 tablets (50 mg total) by mouth daily.  Mixed obsessional thoughts and acts -     lamoTRIgine (LAMICTAL) 25 MG tablet; Take 2 tablets (50 mg total) by mouth daily. -     fluvoxaMINE (LUVOX) 100 MG tablet; Take 1 tablet (100 mg total) by mouth at bedtime.  Generalized anxiety disorder -     fluvoxaMINE (LUVOX) 100  MG tablet; Take 1 tablet (100 mg total) by mouth at bedtime.  Please see After Visit Summary for patient specific instructions.  Answered questions about trazodone for sleep and risk of adverse mood effects unlikely but possible.  Patient's rapid cycling bipolar disorder is much better controlled with the Depakote and lamotrigine he is tolerating it reasonably well with some morning drowsiness which is manageable.  His anger is manageable at this time.  Try Depakote at night and see if sleepiness is better, apparently that is the cause.  OCD is much improved also though not resolved.  He is been on fluvoxamine 21-months or so.  We discussed how it may take 2-3 months to get the full benefit of fluvoxamine.  Because he is having some morning drowsiness we will defer an increase though he is taking lower than the usual doses used for OCD.  He is aware of that we have the option to go up.  Feels much calmer than ever.  Call if there are worsening symptoms.  May have to explain to wife the reason for the emotional changes to which she needs to adjust.  Changes are healthier.  Switch all meds to PM to reduce drowsiness.  No dosage changes.  This appt was 30 mins.  Follow-up 6 mos  Lynder Parents MD, DFAPA  Future Appointments  Date Time Provider [place]  11/01/2018  9:00 AM Wilhelmina Mcardle, MD [facility] None  11/08/2018  3:30 PM Stoioff, Ronda Fairly, MD [facility] None    No orders of the defined types were placed in this encounter.     -------------------------------

## 2018-10-31 ENCOUNTER — Telehealth: Payer: Self-pay

## 2018-10-31 NOTE — Telephone Encounter (Signed)
Called patient for COVID-19 pre-screening for in office visit.  Have you recently traveled any where out of the local area in the last 2 weeks?   Have you been in close contact with a person diagnosed with COVID-19 or someone awaiting results within the last 2 weeks? yes  Do you currently have any of the following symptoms? If so, when did they start? Cough     Diarrhea   Joint Pain Fever      Muscle Pain   Red eyes Shortness of breath   Abdominal pain  Vomiting Loss of smell    Rash    Sore Throat Headache    Weakness   Bruising or bleeding   Appointment changed to a phone visit due to family members living in the household have recently tested positive for COVID.

## 2018-11-01 ENCOUNTER — Encounter: Payer: Self-pay | Admitting: Pulmonary Disease

## 2018-11-01 ENCOUNTER — Other Ambulatory Visit: Payer: Self-pay

## 2018-11-01 ENCOUNTER — Ambulatory Visit (INDEPENDENT_AMBULATORY_CARE_PROVIDER_SITE_OTHER): Payer: BLUE CROSS/BLUE SHIELD | Admitting: Pulmonary Disease

## 2018-11-01 DIAGNOSIS — J4530 Mild persistent asthma, uncomplicated: Secondary | ICD-10-CM

## 2018-11-01 DIAGNOSIS — R053 Chronic cough: Secondary | ICD-10-CM

## 2018-11-01 DIAGNOSIS — K219 Gastro-esophageal reflux disease without esophagitis: Secondary | ICD-10-CM

## 2018-11-01 DIAGNOSIS — R05 Cough: Secondary | ICD-10-CM

## 2018-11-01 MED ORDER — ALBUTEROL SULFATE HFA 108 (90 BASE) MCG/ACT IN AERS: 2.0000 | INHALATION_SPRAY | Freq: Four times a day (QID) | RESPIRATORY_TRACT | 5 refills | Status: DC | PRN

## 2018-11-01 NOTE — Progress Notes (Signed)
PULMONARY OFFICE FOLLOW UP NOTE  Requesting MD/Service: Golden Pop, MD Date of initial consultation: 08/14/18 Reason for consultation: chronic cough  PT PROFILE: 43 y.o. adult former regular light smoker, now occasional smoker/vaper (not every day) referred for evaluation of chronic cough  DATA:  Virtual Visit via Telephone Note I connected with Rodney Peters on 11/01/18 at  9:00 AM EDT by telephone and verified that I am speaking with the correct person using two identifiers. I discussed the limitations, risks, security and privacy concerns of performing an evaluation and management service by telephone and the availability of in person appointments. I also discussed with the patient that there may be a patient responsible charge related to this service. The patient expressed understanding and agreed to proceed.   INTERVAL: Initial consultation 08/14/18 for chronic cough, possible asthma. Treated with smoking/vaping cessation, PPI, ICS, loratadine, cough suppressant.   SUBJ:  Last month, his son tested positive for SARS-CoV-2.  His son was asymptomatic.  However, patient had symptoms suggestive of COVID-19 (shortness of breath, headaches, loss of taste/smell, fatigue).  He has now fully recovered from the symptoms.  He states that overall he is "fine" and "everything has resolved" itself.  However, he does report infrequent cough and exertional dyspnea when exposed to hot humid weather.  He is used his albuterol rescue inhaler approximately 3 times in the past week.  Overall, he is very pleased with his improvement.  He remains on ICS (fluticasone), omeprazole, loratadine.  Presently he denies CP, fever, purulent sputum, hemoptysis, LE edema and calf tenderness.   There were no vitals filed for this visit.   EXAM:  Due to the remote nature of this encounter, no physical examination could be performed  DATA:   BMP Latest Ref Rng & Units 02/13/2018 08/16/2016 11/11/2015  Glucose 65 -  99 mg/dL 93 94 111(H)  BUN 6 - 24 mg/dL 11 18 11   Creatinine 0.76 - 1.27 mg/dL 1.00 1.14 0.95  BUN/Creat Ratio 9 - 20 11 16 12   Sodium 134 - 144 mmol/L 142 139 140  Potassium 3.5 - 5.2 mmol/L 4.3 3.8 4.1  Chloride 96 - 106 mmol/L 102 99 97  CO2 20 - 29 mmol/L 22 24 26   Calcium 8.7 - 10.2 mg/dL 9.2 8.9 9.6    CBC Latest Ref Rng & Units 08/16/2016 08/13/2015  WBC 3.4 - 10.8 x10E3/uL 9.0 11.1(H)  Hemoglobin 13.0 - 17.7 g/dL 15.7 15.3  Hematocrit 37.5 - 51.0 % 45.6 44.3  Platelets 150 - 379 x10E3/uL 220 242    CXR:  No new film  I have personally reviewed all chest radiographs reported above including CXRs and CT chest unless otherwise indicated  IMPRESSION:     ICD-10-CM   1. Chronic cough  R05 Pulmonary Function Test [facility] Only  2. Probable asthma  J45.30 Pulmonary Function Test [facility] Only  3. GERD  K21.9      PLAN:  1) continue Flovent 110 mcg, 2 inhalations twice a day.  Rinse mouth after use 2) continue albuterol rescue inhaler, 2 actuations up to every 6 hours as needed for increased shortness of breath, wheezing, chest tightness, cough.  Prescription refilled 3) Continue omeprazole 40 mg daily 4) may try off of Claritin and remain off this medication if symptoms of allergy and cough do not worsen  Follow-up in 3-4 months with PFTs (lung function test) prior to that visit    Merton Border, MD PCCM service Mobile [phone] Pager [phone] 11/01/2018 9:23 AM

## 2018-11-01 NOTE — Patient Instructions (Addendum)
1) continue Flovent 110 mcg, 2 inhalations twice a day.  Rinse mouth after use 2) continue albuterol rescue inhaler, 2 actuations up to every 6 hours as needed for increased shortness of breath, wheezing, chest tightness, cough.  Prescription refilled 3) Continue omeprazole 40 mg daily 4) may try off of Claritin and remain off this medication if symptoms of allergy and cough do not worsen  Follow-up in 3-4 months with PFTs (lung function test) prior to that visit

## 2018-11-08 ENCOUNTER — Other Ambulatory Visit: Payer: Self-pay

## 2018-11-08 ENCOUNTER — Ambulatory Visit: Payer: BLUE CROSS/BLUE SHIELD | Admitting: Urology

## 2018-11-08 ENCOUNTER — Encounter: Payer: Self-pay | Admitting: Urology

## 2018-11-08 VITALS — BP 170/105 | HR 102 | Ht 70.0 in | Wt 263.1 lb

## 2018-11-08 DIAGNOSIS — Z3009 Encounter for other general counseling and advice on contraception: Secondary | ICD-10-CM

## 2018-11-08 NOTE — Patient Instructions (Signed)
Pre-Vasectomy Instructions  STOP all aspirin or blood thinners (Aspirin, Plavix, Coumadin, Warfarin, Motrin, Ibuprofen, Advil, Aleve, Naproxen, Naprosyn) for 7 days prior to the procedure.  If you have any questions about stopping these medications please contact your primary care physician or cardiologist.  Shave all hair from the upper scrotum on the day of the procedure.  This means just under the penis onto the scrotal sac.  The area shaved should measure about 2-3 inches around.  You may lather the scrotum with soap and water, and shave with a safety razor.  After shaving the area, thoroughly wash the penis and the scrotum, then shower or bathe to remove all the loose hairs.  If needed, wash the area again just before coming in for your circumcision.  It is recommended to have a light meal an hour or so prior to the procedure.  Bring a scrotal support (jock strap or suspensory, or tight jockey shorts or underwear).  Wear comfortable pants or shorts.  While the actual procedure usually takes about 45 minutes, you should be prepared to stay in the office for approximately one hour.  Bring someone with you to drive you home.  If you have any questions or concerns, please feel free to call the office at [phone].      Vasectomy Vasectomy is a procedure in which the tube that carries sperm from the testicle to the urethra (vas deferens) is tied. It may also be cut. The procedure blocks sperm from going through the vas deferens and penis during ejaculation. This ensures that sperm does not go into the vagina during sex. Vasectomy does not affect your sexual desire or performance, and does not prevent sexually transmitted diseases. Vasectomy is considered a permanent and very effective form of birth control (contraception). The decision to have a vasectomy should not be made during a stressful situation, such as after the loss of a pregnancy or a divorce. You and your partner should make  the decision to have a vasectomy when you are sure that you do not want children in the future. Tell a health care provider about:  Any allergies you have.  All medicines you are taking, including vitamins, herbs, eye drops, creams, and over-the-counter medicines.  Any problems you or family members have had with anesthetic medicines.  Any blood disorders you have.  Any surgeries you have had.  Any medical conditions you have. What are the risks? Generally, this is a safe procedure. However, problems may occur, including:  Infection.  Bleeding and swelling of the scrotum.  Allergic reactions to medicines.  Failure of the procedure to prevent pregnancy. There is a very small chance that the cut ends of the vas deferens may reconnect (recanalization), meaning that you could still make a woman pregnant.  Pain in the scrotum that continues after healing from the procedure. What happens before the procedure?  Ask your health care provider about: ? Changing or stopping your regular medicines. This is especially important if you are taking diabetes medicines or blood thinners. ? Taking over-the-counter medicines, vitamins, herbs, and supplements. ? Taking medicines such as aspirin and ibuprofen. These medicines can thin your blood. Do not take these medicines unless your health care provider tells you to take them.  You may be asked to shower with a germ-killing soap.  Plan to have someone take you home from the hospital or clinic. What happens during the procedure?   To lower your risk of infection: ? Your health care team will  wash or sanitize their hands. ? Hair may be removed from the surgical area. ? Your scrotum will be washed with soap.  You will be given one or more of the following: ? A medicine to help you relax (sedative). You may be instructed to take this a few hours before the procedure. ? A medicine to numb the area (local anesthetic).  Your health care  provider will feel (palpate) for your vas deferens.  To reach the vas deferens, one of two methods may be used: ? A very small incision may be made in your scrotum. ? A punctured opening may be made in your scrotum, without an incision.  Your vas deferens will be pulled out of your scrotum, and may be: ? Tied off. ? Cut and possibly burned (cauterized) at the ends to seal them off.  The vas deferens will be put back into your scrotum.  The incision or puncture opening will be closed with absorbable stitches (sutures). The sutures will eventually dissolve and will not need to be removed after the procedure. The procedure may vary among health care providers and hospitals. What happens after the procedure?  You will be monitored to make sure that you do not experience problems.  You will be asked not to ejaculate for at least 1 week after the procedure, or as long as directed.  You will need to use a different form of contraception for 2-4 months after the procedure, until you have test results confirming that there are no sperm in your semen.  You may be given scrotal support to wear, such as a jock strap or underwear with a supportive pouch.  Do not drive for 24 hours if you were given a sedative to help you relax. Summary  Vasectomy is considered a permanent and very effective form of birth control (contraception). The procedure prevents sperm from being released during ejaculation.  Your scrotum will be numbed with medicine (local anesthetic) for the procedure.  After the procedure, you will be asked not to ejaculate for at least 1 week, or for as long as directed. You will also need to use a different form of contraception until your health care provider examines you and finds that there are no sperm in your semen. This information is not intended to replace advice given to you by your health care provider. Make sure you discuss any questions you have with your health care  provider. Document Released: 05/28/2002 Document Revised: 09/08/2017 Document Reviewed: 06/03/2016 Elsevier Patient Education  [place].

## 2018-11-10 ENCOUNTER — Encounter: Payer: Self-pay | Admitting: Urology

## 2018-11-10 MED ORDER — DIAZEPAM 10 MG PO TABS: ORAL_TABLET | ORAL | 0 refills | Status: DC

## 2018-11-10 NOTE — Progress Notes (Signed)
11/08/2018 4:44 PM   Shari Prows Braulio Bosch 1975/06/26 [id number]  Referring provider: Guadalupe Maple, MD [address] [place],  [place] [address]  Chief Complaint  Patient presents with   VAS Consult    HPI: Rodney Peters is a 43 y.o. male who presents for vasectomy counseling.  He is married with 7 children ages 62-17.  He states he and his wife desire vasectomy as a means of permanent sterilization.  He denies previous history of urologic problems and specifically denies history of epididymitis or chronic scrotal pain.  No prior surgeries.   PMH: Past Medical History:  Diagnosis Date   Abnormal TSH    Anxiety    Bipolar disorder (HCC)    GERD (gastroesophageal reflux disease)    History of mononucleosis    Hyperglycemia    Lymphocytosis    Vitamin B 12 deficiency    Vitamin D deficiency     Surgical History: No past surgical history on file.  Home Medications:  Allergies as of 11/08/2018   No Known Allergies     Medication List       Accurate as of November 08, 2018 11:59 PM. If you have any questions, ask your nurse or doctor.        STOP taking these medications   naproxen 500 MG tablet Commonly known as: Naprosyn Stopped by: Abbie Sons, MD     TAKE these medications   albuterol 108 (90 Base) MCG/ACT inhaler Commonly known as: VENTOLIN HFA Inhale 2 puffs into the lungs every 6 (six) hours as needed for wheezing or shortness of breath.   allopurinol 300 MG tablet Commonly known as: ZYLOPRIM Take 1 tablet (300 mg total) by mouth daily.   colchicine 0.6 MG tablet Take 1 tablet (0.6 mg total) by mouth daily. May take an extra tab for flair   divalproex 500 MG 24 hr tablet Commonly known as: DEPAKOTE ER Take 2 tablets (1,000 mg total) by mouth at bedtime.   Ester-C 500-200-60 MG Tabs   fluticasone 110 MCG/ACT inhaler Commonly known as: FLOVENT HFA Inhale 2 puffs into the lungs 2 (two) times daily. Rinse mouth thoroughly after use     fluvoxaMINE 100 MG tablet Commonly known as: LUVOX Take 1 tablet (100 mg total) by mouth at bedtime.   lamoTRIgine 25 MG tablet Commonly known as: LAMICTAL Take 2 tablets (50 mg total) by mouth daily.   loratadine 10 MG tablet Commonly known as: Claritin Take 1 tablet (10 mg total) by mouth daily.   multivitamin tablet Take 1 tablet by mouth daily.   omeprazole 40 MG capsule Commonly known as: PriLOSEC Take 1 capsule (40 mg total) by mouth daily. Take at dinner time   VITAMIN D (CHOLECALCIFEROL) PO Take 5,000 Units by mouth daily.       Allergies: No Known Allergies  Family History: Family History  Problem Relation Age of Onset   Anxiety disorder Father    COPD Father    Other Sister        substance abuse   Cancer Maternal Grandmother    Hypertension Maternal Grandfather    Cancer Paternal Grandfather     Social History:  reports that he quit smoking about 5 years ago. His smoking use included cigarettes. He started smoking about 22 years ago. He quit smokeless tobacco use about 10 years ago.  His smokeless tobacco use included chew. He reports current alcohol use of about 6.0 standard drinks of alcohol per week. He reports that  he does not use drugs.  ROS: UROLOGY Frequent Urination?: No Hard to postpone urination?: No Burning/pain with urination?: No Get up at night to urinate?: No Leakage of urine?: No Urine stream starts and stops?: No Trouble starting stream?: No Do you have to strain to urinate?: No Blood in urine?: No Urinary tract infection?: No Sexually transmitted disease?: No Injury to kidneys or bladder?: No Painful intercourse?: No Weak stream?: No Erection problems?: No Penile pain?: No Currently pregnant?: No Vaginal bleeding?: No Last menstrual period?: n  Gastrointestinal Nausea?: No Vomiting?: No Indigestion/heartburn?: No Diarrhea?: No Constipation?: No  Constitutional Fever: No Night sweats?: No Weight loss?:  No Fatigue?: No  Skin Skin rash/lesions?: No Itching?: No  Eyes Blurred vision?: No Double vision?: No  Ears/Nose/Throat Sore throat?: No Sinus problems?: No  Hematologic/Lymphatic Swollen glands?: No Easy bruising?: No  Cardiovascular Leg swelling?: No Chest pain?: No  Respiratory Cough?: No Shortness of breath?: No  Endocrine Excessive thirst?: No  Musculoskeletal Back pain?: No Joint pain?: No  Neurological Headaches?: No Dizziness?: No  Psychologic Depression?: No Anxiety?: No  Physical Exam: BP (!) 170/105 (BP Location: Left Arm, Patient Position: Sitting, Cuff Size: Normal)    Pulse (!) 102    Ht 5\' 10"  (1.778 m)    Wt 263 lb 1.6 oz (119.3 kg)    BMI 37.75 kg/m   Constitutional:  Alert and oriented, No acute distress. HEENT:  AT, moist mucus membranes.  Trachea midline, no masses. Cardiovascular: No clubbing, cyanosis, or edema. Respiratory: Normal respiratory effort, no increased work of breathing. GI: Abdomen is soft, nontender, nondistended, no abdominal masses GU: No CVA tenderness.  Phallus without lesions, testes descended bilaterally.  Thick spermatic cords bilaterally.  Vasa palpable but difficult. Lymph: No cervical or inguinal lymphadenopathy. Skin: No rashes, bruises or suspicious lesions. Neurologic: Grossly intact, no focal deficits, moving all 4 extremities. Psychiatric: Normal mood and affect.   Assessment & Plan:    - Undesired fertility We had a long discussion about vasectomy. We specifically discussed the procedure, recovery and the risks, benefits and alternatives of vasectomy. I explained that the procedure entails removal of a segment of each vas deferens, each of which conducts sperm, and that the purpose of this procedure is to cause sterility (inability to produce children or cause pregnancy). Vasectomy is intended to be permanent and irreversible form of contraception. Options for fertility after vasectomy include vasectomy  reversal, or sperm retrieval with in vitro fertilization. These options are not always successful, and they may be expensive. We discussed reversible forms of birth control such as condoms, IUD or diaphragms, as well as the option of freezing sperm in a sperm bank prior to the vasectomy procedure. We discussed the importance of avoiding strenuous exercise for four days after vasectomy, and the importance of refraining from any form of ejaculation for seven days after vasectomy. I explained that vasectomy does not produce immediate sterility so another form of contraceptive must be used until sterility is assured by having semen checked for sperm. Thus, a post vasectomy semen analysis is necessary to confirm sterility. Rarely, vasectomy must be repeated. We discussed the approximately 1 in 2,000 risk of pregnancy after vasectomy for men who have post-vasectomy semen analysis showing absent sperm or rare non-motile sperm. Typical side effects include a small amount of oozing blood, some discomfort and mild swelling in the area of incision.  Vasectomy does not affect sexual performance, function, please, sensation, interest, desire, satisfaction, penile erection, volume of semen or ejaculation.  Other rare risks include allergy or adverse reaction to an anesthetic, testicular atrophy, hematoma, infection/abscess, prolonged tenderness of the vas deferens, pain, swelling, painful nodule or scar (called sperm granuloma) or epididymtis. We discussed chronic testicular pain syndrome. This has been reported to occur in as many as 1-2% of men and may be permanent. This can be treated with medication, small procedures or (rarely) surgery.  A preprocedure anxiolytic was sent to his pharmacy and he was informed he would need a driver if utilizing this medication.   Abbie Sons, [place] [address], [place], [place] [address] [phone]

## 2018-11-20 ENCOUNTER — Other Ambulatory Visit: Payer: Self-pay

## 2018-11-20 ENCOUNTER — Ambulatory Visit (INDEPENDENT_AMBULATORY_CARE_PROVIDER_SITE_OTHER): Payer: BLUE CROSS/BLUE SHIELD | Admitting: Family Medicine

## 2018-11-20 ENCOUNTER — Encounter: Payer: Self-pay | Admitting: Family Medicine

## 2018-11-20 VITALS — BP 141/93 | HR 100 | Temp 98.8°F | Wt 271.0 lb

## 2018-11-20 DIAGNOSIS — I10 Essential (primary) hypertension: Secondary | ICD-10-CM

## 2018-11-20 DIAGNOSIS — R002 Palpitations: Secondary | ICD-10-CM | POA: Diagnosis not present

## 2018-11-20 DIAGNOSIS — R0683 Snoring: Secondary | ICD-10-CM | POA: Diagnosis not present

## 2018-11-20 LAB — MICROALBUMIN, URINE WAIVED
Creatinine, Urine Waived: 200 mg/dL (ref 10–300)
Microalb, Ur Waived: 150 mg/L — ABNORMAL HIGH (ref 0–19)

## 2018-11-20 MED ORDER — LISINOPRIL 10 MG PO TABS: 10.0000 mg | ORAL_TABLET | Freq: Every day | ORAL | 3 refills | Status: DC

## 2018-11-20 NOTE — Patient Instructions (Signed)

## 2018-11-20 NOTE — Progress Notes (Signed)
BP (!) 141/93   Pulse 100   Temp 98.8 F (37.1 C) (Oral)   Wt 271 lb (122.9 kg)   SpO2 96%   BMI 38.88 kg/m    Subjective:    Patient ID: Rodney Peters, male    DOB: 1975/04/13, 43 y.o.   MRN: [id number]  HPI: [name] is a 43 y.o. male  Chief Complaint  Patient presents with  . Hypertension    pt states he has had high BP readings over a year  . Breathing Problem    states that he stopps breathing when he is asleep and somtimes even when he is awake, snores    ELEVATED BLOOD PRESSURE- notes that his smart watch is telling him he's in a. Fib and his heart races, he only feels it occasionally, but it is concerning him.  Duration of elevated BP: unknown, at least 3 visits BP monitoring frequency: not checking Previous BP meds: no Recent stressors: yes Family history of hypertension: yes Recurrent headaches: no Visual changes: no Palpitations: yes  Dyspnea: yes Chest pain: no Lower extremity edema: no Dizzy/lightheaded: no Transient ischemic attacks: no  SNORING Sleep apnea status: suspected Duration: chronic Wakes feeling refreshed:  no Daytime hypersomnolence:  yes Fatigue:  yes Insomnia:  no Good sleep hygiene:  no Difficulty falling asleep:  no Difficulty staying asleep:  yes Snoring bothers bed partner:  yes Observed apnea by bed partner: yes Obesity:  yes Hypertension: yes  Pulmonary hypertension:  no Coronary artery disease:  no  Relevant past medical, surgical, family and social history reviewed and updated as indicated. Interim medical history since our last visit reviewed. Allergies and medications reviewed and updated.  Review of Systems  Constitutional: Positive for fatigue. Negative for activity change, appetite change, chills, diaphoresis, fever and unexpected weight change.  Respiratory: Positive for shortness of breath. Negative for apnea, cough, choking, chest tightness, wheezing and stridor.   Cardiovascular: Positive for  palpitations. Negative for chest pain and leg swelling.  Gastrointestinal: Negative.   Neurological: Negative.   Psychiatric/Behavioral: Negative.     Per HPI unless specifically indicated above     Objective:    BP (!) 141/93   Pulse 100   Temp 98.8 F (37.1 C) (Oral)   Wt 271 lb (122.9 kg)   SpO2 96%   BMI 38.88 kg/m   Wt Readings from Last 3 Encounters:  11/20/18 271 lb (122.9 kg)  11/08/18 263 lb 1.6 oz (119.3 kg)  08/14/18 272 lb 6.4 oz (123.6 kg)    Physical Exam Vitals signs and nursing note reviewed.  Constitutional:      General: He is not in acute distress.    Appearance: Normal appearance. He is obese. He is not ill-appearing, toxic-appearing or diaphoretic.  HENT:     Head: Normocephalic and atraumatic.     Right Ear: External ear normal.     Left Ear: External ear normal.     Nose: Nose normal.     Mouth/Throat:     Mouth: Mucous membranes are moist.     Pharynx: Oropharynx is clear.  Eyes:     General: No scleral icterus.       Right eye: No discharge.        Left eye: No discharge.     Extraocular Movements: Extraocular movements intact.     Conjunctiva/sclera: Conjunctivae normal.     Pupils: Pupils are equal, round, and reactive to light.  Neck:     Musculoskeletal: Normal range  of motion and neck supple.  Cardiovascular:     Rate and Rhythm: Regular rhythm. Tachycardia present.     Pulses: Normal pulses.     Heart sounds: Normal heart sounds. No murmur. No friction rub. No gallop.   Pulmonary:     Effort: Pulmonary effort is normal. No respiratory distress.     Breath sounds: Normal breath sounds. No stridor. No wheezing, rhonchi or rales.  Chest:     Chest wall: No tenderness.  Musculoskeletal: Normal range of motion.  Skin:    General: Skin is warm and dry.     Capillary Refill: Capillary refill takes less than 2 seconds.     Coloration: Skin is not jaundiced or pale.     Findings: No bruising, erythema, lesion or rash.  Neurological:      General: No focal deficit present.     Mental Status: He is alert and oriented to person, place, and time. Mental status is at baseline.  Psychiatric:        Mood and Affect: Mood normal.        Behavior: Behavior normal.        Thought Content: Thought content normal.        Judgment: Judgment normal.     Results for orders placed or performed in visit on 02/13/18  Lipid panel  Result Value Ref Range   Cholesterol, Total 223 (H) 100 - 199 mg/dL   Triglycerides 396 (H) 0 - 149 mg/dL   HDL 30 (L) >39 mg/dL   VLDL Cholesterol Cal 79 (H) 5 - 40 mg/dL   LDL Calculated 114 (H) 0 - 99 mg/dL   Chol/HDL Ratio 7.4 (H) 0.0 - 5.0 ratio  Uric acid  Result Value Ref Range   Uric Acid 5.4 3.7 - 8.6 mg/dL  VITAMIN D 25 Hydroxy (Vit-D Deficiency, Fractures)  Result Value Ref Range   Vit D, 25-Hydroxy 43.9 30.0 - 100.0 ng/mL  Basic metabolic panel  Result Value Ref Range   Glucose 93 65 - 99 mg/dL   BUN 11 6 - 24 mg/dL   Creatinine, Ser 1.00 0.76 - 1.27 mg/dL   GFR calc non Af Amer 93 >59 mL/min/1.73   GFR calc Af Amer 108 >59 mL/min/1.73   BUN/Creatinine Ratio 11 9 - 20   Sodium 142 134 - 144 mmol/L   Potassium 4.3 3.5 - 5.2 mmol/L   Chloride 102 96 - 106 mmol/L   CO2 22 20 - 29 mmol/L   Calcium 9.2 8.7 - 10.2 mg/dL      Assessment & Plan:   Problem List Items Addressed This Visit    None    Visit Diagnoses    Essential hypertension    -  Primary   Will check labs and sleep study and work on [employer]. Start lisinopril. Recheck 1 month. Call with any concerns.    Relevant Medications   lisinopril (ZESTRIL) 10 MG tablet   Other Relevant Orders   Basic metabolic panel   Microalbumin, Urine Waived   Snoring       Concern for OSA- will obtain sleep study. Await results. Call with any concerns.    Relevant Orders   Ambulatory referral to Sleep Studies   Palpitations       No sign of a fib on EKG. Offered referral to cardiology for holter- would like to do sleep study first.  Recheck 1 month. Call with any concerns.    Relevant Orders   EKG 12-Lead (Completed)  Follow up plan: Return in about 4 weeks (around 12/18/2018) for follow up BP.

## 2018-11-21 LAB — BASIC METABOLIC PANEL
BUN/Creatinine Ratio: 12 (ref 9–20)
BUN: 14 mg/dL (ref 6–24)
CO2: 25 mmol/L (ref 20–29)
Calcium: 10.1 mg/dL (ref 8.7–10.2)
Chloride: 96 mmol/L (ref 96–106)
Creatinine, Ser: 1.16 mg/dL (ref 0.76–1.27)
GFR calc Af Amer: 89 mL/min/{1.73_m2} (ref >59)
GFR calc non Af Amer: 77 mL/min/{1.73_m2} (ref >59)
Glucose: 92 mg/dL (ref 65–99)
Potassium: 4.6 mmol/L (ref 3.5–5.2)
Sodium: 140 mmol/L (ref 134–144)

## 2018-12-10 ENCOUNTER — Telehealth: Payer: Self-pay | Admitting: Family Medicine

## 2018-12-10 DIAGNOSIS — R070 Pain in throat: Secondary | ICD-10-CM

## 2018-12-10 NOTE — Telephone Encounter (Signed)
Patient notified

## 2018-12-10 NOTE — Telephone Encounter (Signed)
Routing to provider  

## 2018-12-10 NOTE — Telephone Encounter (Signed)
Call pt ordered

## 2018-12-10 NOTE — Telephone Encounter (Signed)
Copied from [place] [phone]. Topic: General - Other >> Dec 10, 2018 11:25 AM Keene Breath wrote: Reason for CRM:  Patient called to request a referral to an ENT because of the issues he is having in the back of his nose and top of his throat.  Please advise and call patient once referral has been approved.  CB# [phone]

## 2018-12-17 NOTE — Progress Notes (Signed)
BP (!) 129/91   Pulse (!) 104   Temp 98.6 F (37 C) (Oral)   Ht 5\' 10"  (1.778 m)   Wt 267 lb (121.1 kg)   SpO2 97%   BMI 38.31 kg/m    Subjective:    Patient ID: Rodney Peters, male    DOB: [birth year], 43 y.o.   MRN: [id number]  HPI: Rodney Peters is a 43 y.o. male  Chief Complaint  Patient presents with  . Hypertension   Has been having a lot of pressure in his face that will gag him and then make him throw up. No fever. No chills. He feels like his lungs are better since seeing the pulmonologist, but has been having problems with at the back of his throat, up high- in the area of his nasopharynx. He is otherwise feeling well with no other concerns or complaints at this time.   HYPERTENSION Hypertension status: controlled  Satisfied with current treatment? yes Duration of hypertension: chronic BP monitoring frequency:  not checking BP medication side effects:  no Medication compliance: excellent compliance Previous BP meds: lisinopril Aspirin: no Recurrent headaches: yes Visual changes: no Palpitations: yes Dyspnea: no Chest pain: no Lower extremity edema: no Dizzy/lightheaded: no   Relevant past medical, surgical, family and social history reviewed and updated as indicated. Interim medical history since our last visit reviewed. Allergies and medications reviewed and updated.  Review of Systems  Constitutional: Negative.   HENT: Positive for facial swelling, postnasal drip, rhinorrhea, sinus pressure and sneezing. Negative for congestion, dental problem, drooling, ear discharge, ear pain, hearing loss, mouth sores, nosebleeds, sinus pain, sore throat, tinnitus, trouble swallowing and voice change.   Eyes: Negative.   Respiratory: Negative.   Cardiovascular: Negative.   Psychiatric/Behavioral: Negative.     Per HPI unless specifically indicated above     Objective:    BP (!) 129/91   Pulse (!) 104   Temp 98.6 F (37 C) (Oral)   Ht 5\' 10"  (1.778 m)    Wt 267 lb (121.1 kg)   SpO2 97%   BMI 38.31 kg/m   Wt Readings from Last 3 Encounters:  12/18/18 267 lb (121.1 kg)  11/20/18 271 lb (122.9 kg)  11/08/18 263 lb 1.6 oz (119.3 kg)    Physical Exam Vitals signs and nursing note reviewed.  Constitutional:      General: He is not in acute distress.    Appearance: Normal appearance. He is obese. He is not ill-appearing, toxic-appearing or diaphoretic.  HENT:     Head: Normocephalic and atraumatic.     Right Ear: External ear normal.     Left Ear: External ear normal.     Nose: Congestion present. No rhinorrhea.     Mouth/Throat:     Mouth: Mucous membranes are moist.     Pharynx: Oropharynx is clear.  Eyes:     General: No scleral icterus.       Right eye: No discharge.        Left eye: No discharge.     Extraocular Movements: Extraocular movements intact.     Conjunctiva/sclera: Conjunctivae normal.     Pupils: Pupils are equal, round, and reactive to light.  Neck:     Musculoskeletal: Normal range of motion and neck supple.  Cardiovascular:     Rate and Rhythm: Normal rate and regular rhythm.     Pulses: Normal pulses.     Heart sounds: Normal heart sounds. No murmur. No friction rub. No gallop.  Pulmonary:     Effort: Pulmonary effort is normal. No respiratory distress.     Breath sounds: Normal breath sounds. No stridor. No wheezing, rhonchi or rales.  Chest:     Chest wall: No tenderness.  Musculoskeletal: Normal range of motion.  Skin:    General: Skin is warm and dry.     Capillary Refill: Capillary refill takes less than 2 seconds.     Coloration: Skin is not jaundiced or pale.     Findings: No bruising, erythema, lesion or rash.  Neurological:     General: No focal deficit present.     Mental Status: He is alert and oriented to person, place, and time. Mental status is at baseline.  Psychiatric:        Mood and Affect: Mood normal.        Behavior: Behavior normal.        Thought Content: Thought content  normal.        Judgment: Judgment normal.     Results for orders placed or performed in visit on 123XX123  Basic metabolic panel  Result Value Ref Range   Glucose 92 65 - 99 mg/dL   BUN 14 6 - 24 mg/dL   Creatinine, Ser 1.16 0.76 - 1.27 mg/dL   GFR calc non Af Amer 77 >59 mL/min/1.73   GFR calc Af Amer 89 >59 mL/min/1.73   BUN/Creatinine Ratio 12 9 - 20   Sodium 140 134 - 144 mmol/L   Potassium 4.6 3.5 - 5.2 mmol/L   Chloride 96 96 - 106 mmol/L   CO2 25 20 - 29 mmol/L   Calcium 10.1 8.7 - 10.2 mg/dL  Microalbumin, Urine Waived  Result Value Ref Range   Microalb, Ur Waived 150 (H) 0 - 19 mg/L   Creatinine, Urine Waived 200 10 - 300 mg/dL   Microalb/Creat Ratio 30-300 (H) <30 mg/g      Assessment & Plan:   Problem List Items Addressed This Visit      Cardiovascular and Mediastinum   Essential hypertension - Primary    Under good control on current regimen. Continue current regimen. Continue to monitor. Call with any concerns. Refills given. Labs drawn today.       Relevant Medications   lisinopril (ZESTRIL) 10 MG tablet   Other Relevant Orders   Basic metabolic panel    Other Visit Diagnoses    Nasal congestion       Concern for irritant rhinitis- will try burst of prednisone. If getting better, will start flonase. Call with any concerns. Continue to monitor.    Flu vaccine need       Flu shot given today.   Relevant Orders   Flu Vaccine QUAD 36+ mos IM (Completed)       Follow up plan: Return in about 6 months (around 06/17/2019) for Physical.

## 2018-12-18 ENCOUNTER — Encounter: Payer: BLUE CROSS/BLUE SHIELD | Admitting: Urology

## 2018-12-18 ENCOUNTER — Other Ambulatory Visit: Payer: Self-pay

## 2018-12-18 ENCOUNTER — Ambulatory Visit (INDEPENDENT_AMBULATORY_CARE_PROVIDER_SITE_OTHER): Payer: BLUE CROSS/BLUE SHIELD | Admitting: Family Medicine

## 2018-12-18 ENCOUNTER — Encounter: Payer: Self-pay | Admitting: Family Medicine

## 2018-12-18 VITALS — BP 129/91 | HR 104 | Temp 98.6°F | Ht 70.0 in | Wt 267.0 lb

## 2018-12-18 DIAGNOSIS — R0981 Nasal congestion: Secondary | ICD-10-CM

## 2018-12-18 DIAGNOSIS — I10 Essential (primary) hypertension: Secondary | ICD-10-CM | POA: Diagnosis not present

## 2018-12-18 DIAGNOSIS — Z23 Encounter for immunization: Secondary | ICD-10-CM

## 2018-12-18 MED ORDER — PREDNISONE 50 MG PO TABS: 50.0000 mg | ORAL_TABLET | Freq: Every day | ORAL | 0 refills | Status: DC

## 2018-12-18 MED ORDER — LISINOPRIL 10 MG PO TABS: 10.0000 mg | ORAL_TABLET | Freq: Every day | ORAL | 1 refills | Status: DC

## 2018-12-18 NOTE — Assessment & Plan Note (Signed)
Under good control on current regimen. Continue current regimen. Continue to monitor. Call with any concerns. Refills given. Labs drawn today.   

## 2018-12-19 ENCOUNTER — Ambulatory Visit: Payer: BLUE CROSS/BLUE SHIELD | Admitting: Urology

## 2018-12-19 ENCOUNTER — Encounter: Payer: Self-pay | Admitting: Urology

## 2018-12-19 VITALS — BP 132/89 | HR 112 | Ht 70.0 in | Wt 265.0 lb

## 2018-12-19 DIAGNOSIS — Z302 Encounter for sterilization: Secondary | ICD-10-CM

## 2018-12-19 LAB — BASIC METABOLIC PANEL
BUN/Creatinine Ratio: 12 (ref 9–20)
BUN: 14 mg/dL (ref 6–24)
CO2: 22 mmol/L (ref 20–29)
Calcium: 9.8 mg/dL (ref 8.7–10.2)
Chloride: 99 mmol/L (ref 96–106)
Creatinine, Ser: 1.16 mg/dL (ref 0.76–1.27)
GFR calc Af Amer: 89 mL/min/{1.73_m2} (ref >59)
GFR calc non Af Amer: 77 mL/min/{1.73_m2} (ref >59)
Glucose: 98 mg/dL (ref 65–99)
Potassium: 4.6 mmol/L (ref 3.5–5.2)
Sodium: 139 mmol/L (ref 134–144)

## 2018-12-19 MED ORDER — HYDROCODONE-ACETAMINOPHEN 5-325 MG PO TABS: 1.0000 | ORAL_TABLET | ORAL | 0 refills | Status: DC | PRN

## 2018-12-19 NOTE — Progress Notes (Signed)
Vasectomy Procedure Note  Indications: The patient is a 43 y.o. male who presents today for elective sterilization.  He has been consented for the procedure.  He is aware of the risks and benefits.  He had no additional questions.  He agrees to proceed.  He denies any other significant change since his last visit.  Pre-operative Diagnosis: Elective sterilization  Post-operative Diagnosis: Elective sterilization  Premedication: Valium 10 mg po  Surgeon: Nicki Reaper C. Jaylen Claude, M.D  Description: The patient was prepped and draped in the standard fashion.  The right vas deferens was identified and brought superiorly to the anterior scrotal skin.  The skin and vas was then anesthetized utilizing 5 ml 1% lidocaine.  A small stab incision was made and spread with the vas dissector.  The vas was grasped utilizing the vas clamp and elevated out of the incision.  The vas was dissected free from surrounding tissue and vessels and an ~1 cm segment was excised.  The vas lumens were cauterized utilizing electrocautery.  The distal segment was buried in the surrounding sheath with a 3-0 chromic suture.  No significant bleeding was observed.  The vas ends were then dropped back into the hemiscrotum.  The skin was closed with hemostatic pressure.  An identical procedure was performed on the contralateral side.  Clean dry gauze was applied to the incision sites.  The patient tolerated the procedure well.  Complications:None  Recommendations: 1.  No lifting greater than 10 pounds or strenuousactivity for 1 week. 2.  Scrotal support for 1 week. 3.  Shower only for 1 week; may shower in the morning 4.  May resume intercourse in one week if no significant discomfort.  Continue alternate contraception for 12 weeks.  5.  Call for significant pain, swelling, redness, drainage or fever greater than 100.5. 6.  Rx hydrocodone/APAP 5/325 1-2 every 6 hours as needed for pain. 7.  Follow-up semen analysis in 12 weeks.

## 2018-12-23 ENCOUNTER — Encounter: Payer: Self-pay | Admitting: Urology

## 2018-12-25 ENCOUNTER — Encounter: Payer: Self-pay | Admitting: Family Medicine

## 2018-12-31 ENCOUNTER — Encounter: Payer: Self-pay | Admitting: Family Medicine

## 2018-12-31 ENCOUNTER — Other Ambulatory Visit: Payer: Self-pay

## 2018-12-31 ENCOUNTER — Ambulatory Visit: Payer: Self-pay | Admitting: *Deleted

## 2018-12-31 ENCOUNTER — Ambulatory Visit (INDEPENDENT_AMBULATORY_CARE_PROVIDER_SITE_OTHER): Payer: BLUE CROSS/BLUE SHIELD | Admitting: Family Medicine

## 2018-12-31 VITALS — HR 84 | Ht 70.0 in

## 2018-12-31 DIAGNOSIS — R05 Cough: Secondary | ICD-10-CM

## 2018-12-31 DIAGNOSIS — R053 Chronic cough: Secondary | ICD-10-CM

## 2018-12-31 MED ORDER — FLUTICASONE PROPIONATE 50 MCG/ACT NA SUSP: 2.0000 | Freq: Every day | NASAL | 6 refills | Status: DC

## 2018-12-31 NOTE — Telephone Encounter (Signed)
Patient is having respiratory and cough symptoms- patient has been to several specialist. Patient may be having reflux issue that are inflaming the respiratory system. Patient can cough so bad that he has incontinent episodes and vomits. Prednisone did help symptoms- but patient has had reoccurrence of symptoms as soon as the prednisone was finished.  Reason for Disposition . SEVERE (e.g., excruciating) throat pain  Answer Assessment - Initial Assessment Questions 1. ONSET: "When did the throat start hurting?" (Hours or days ago)      inflamed and raw throat- Thursday night restarted-Friday he ate and as soon as the reflux started- he started vomiting 2. SEVERITY: "How bad is the sore throat?" (Scale 1-10; mild, moderate or severe)   - MILD (1-3):  doesn't interfere with eating or normal activities   - MODERATE (4-7): interferes with eating some solids and normal activities   - SEVERE (8-10):  excruciating pain, interferes with most normal activities   - SEVERE DYSPHAGIA: can't swallow liquids, drooling     moderate 3. STREP EXPOSURE: "Has there been any exposure to strep within the past week?" If so, ask: "What type of contact occurred?"      no 4.  VIRAL SYMPTOMS: "Are there any symptoms of a cold, such as a runny nose, cough, hoarse voice or red eyes?"      no 5. FEVER: "Do you have a fever?" If so, ask: "What is your temperature, how was it measured, and when did it start?"     no 6. PUS ON THE TONSILS: "Is there pus on the tonsils in the back of your throat?"     Throat is irritated- no white patches 7. OTHER SYMPTOMS: "Do you have any other symptoms?" (e.g., difficulty breathing, headache, rash)     Vomiting, coughing 8. PREGNANCY: "Is there any chance you are pregnant?" "When was your last menstrual period?"     n/a  Protocols used: SORE THROAT-A-AH

## 2018-12-31 NOTE — Progress Notes (Signed)
Pulse 84   Ht 5\' 10"  (1.778 m)   BMI 38.02 kg/m    Subjective:    Patient ID: Rodney Peters, male    DOB: [birth year], 43 y.o.   MRN: [id number]  HPI: Rodney Peters is a 43 y.o. male  Chief Complaint  Patient presents with  . Cough    x a few months. patient states that he feels somenthing in the back of his throat that makes him through up. pt states that he would like a referral for an ENT   . Emesis   COUGH- did well with the prednisone last week, then started to feel worse again once he was off the medicine, states that it has gotten worse again, still feels like something is hanging in the back of his throat. He gets irritated and then starts throwing up- seems to be getting a gag and will throw up multiple times a day due to cough.  Duration: months Circumstances of initial development of cough: nothing Cough severity: severe Cough description: causes him to vomit Aggravating factors:  With eating Alleviating factors: prednisone Status:  worse Treatments attempted: cold/sinus, mucinex, albuterol and nasal CCS Wheezing: no Shortness of breath: no Chest pain: no Chest tightness:no Nasal congestion: yes Runny nose: no Postnasal drip: yes Frequent throat clearing or swallowing: yes Hemoptysis: no Fevers: no Night sweats: no Weight loss: no Heartburn: no Recent foreign travel: no Tuberculosis contacts: no  Relevant past medical, surgical, family and social history reviewed and updated as indicated. Interim medical history since our last visit reviewed. Allergies and medications reviewed and updated.  Review of Systems  Constitutional: Negative.   HENT: Positive for congestion, postnasal drip and rhinorrhea. Negative for dental problem, drooling, ear discharge, ear pain, facial swelling, hearing loss, mouth sores, nosebleeds, sinus pressure, sinus pain, sneezing, sore throat, tinnitus, trouble swallowing and voice change.   Respiratory: Positive for cough and  choking. Negative for apnea, chest tightness, shortness of breath, wheezing and stridor.   Cardiovascular: Negative.   Neurological: Negative.   Psychiatric/Behavioral: Negative.     Per HPI unless specifically indicated above     Objective:    Pulse 84   Ht 5\' 10"  (1.778 m)   BMI 38.02 kg/m   Wt Readings from Last 3 Encounters:  12/19/18 265 lb (120.2 kg)  12/18/18 267 lb (121.1 kg)  11/20/18 271 lb (122.9 kg)    Physical Exam Constitutional:      General: He is not in acute distress.    Appearance: Normal appearance. He is well-developed. He is obese. He is not ill-appearing, toxic-appearing or diaphoretic.  HENT:     Head: Normocephalic and atraumatic.     Right Ear: Hearing and external ear normal.     Left Ear: Hearing and external ear normal.     Nose: Nose normal.  Eyes:     General: Lids are normal. No scleral icterus.       Right eye: No discharge.        Left eye: No discharge.     Extraocular Movements: Extraocular movements intact.     Conjunctiva/sclera: Conjunctivae normal.     Pupils: Pupils are equal, round, and reactive to light.  Neck:     Musculoskeletal: Normal range of motion.  Pulmonary:     Effort: Pulmonary effort is normal. No respiratory distress.  Musculoskeletal: Normal range of motion.  Skin:    Coloration: Skin is not jaundiced or pale.     Findings: No bruising,  erythema, lesion or rash.  Neurological:     General: No focal deficit present.     Mental Status: He is alert and oriented to person, place, and time.  Psychiatric:        Mood and Affect: Mood normal.        Speech: Speech normal.        Behavior: Behavior normal.        Thought Content: Thought content normal.        Judgment: Judgment normal.     Results for orders placed or performed in visit on [id number]  Basic metabolic panel  Result Value Ref Range   Glucose 98 65 - 99 mg/dL   BUN 14 6 - 24 mg/dL   Creatinine, Ser 1.16 0.76 - 1.27 mg/dL   GFR calc non Af Amer  77 >59 mL/min/1.73   GFR calc Af Amer 89 >59 mL/min/1.73   BUN/Creatinine Ratio 12 9 - 20   Sodium 139 134 - 144 mmol/L   Potassium 4.6 3.5 - 5.2 mmol/L   Chloride 99 96 - 106 mmol/L   CO2 22 20 - 29 mmol/L   Calcium 9.8 8.7 - 10.2 mg/dL      Assessment & Plan:   Problem List Items Addressed This Visit    None    Visit Diagnoses    Chronic cough    -  Primary   Not doing well. Will start flonase and get him into pulmonology for evaluation. Continue omeprazole. Call with any concerns.    Relevant Orders   Ambulatory referral to Pulmonology       Follow up plan: Return if symptoms worsen or fail to improve.   . This visit was completed via FaceTime due to the restrictions of the COVID-19 pandemic. All issues as above were discussed and addressed. Physical exam was done as above through visual confirmation on FaceTime. If it was felt that the patient should be evaluated in the office, they were directed there. The patient verbally consented to this visit. . Location of the patient: home . Location of the provider: work . Those involved with this call:  . Provider: Park Liter, DO . CMA: Lesle Chris, [place] . Front Desk/Registration: Don Perking  . Time spent on call: 15 minutes with patient face to face via video conference. More than 50% of this time was spent in counseling and coordination of care. 23 minutes total spent in review of patient's record and preparation of their chart.

## 2018-12-31 NOTE — Telephone Encounter (Signed)
To be seen today

## 2019-01-24 ENCOUNTER — Telehealth: Payer: Self-pay | Admitting: Family Medicine

## 2019-01-24 NOTE — Telephone Encounter (Signed)
Called office, Marita [place] unavailable, was on hold for a while and disconnected.

## 2019-01-24 NOTE — Telephone Encounter (Signed)
Can we please get him to sign a records release from [facility] to [facility]? Also please send copy of his CXR. Thanks.

## 2019-01-24 NOTE — Telephone Encounter (Signed)
Copied from [place] [phone]. Topic: Referral - Question >> Jan 23, 2019  4:58 PM Berneta Levins wrote: Reason for CRM:   Marita Kansas with [facility] calling.  States that before they will consider seeing pt he must have Chest Xray done.  Please fax over Chest Xray report if already done or have pt do one and send report if [facility] referral is still requested. Fax number:  [phone] Phone number:  [phone]

## 2019-01-28 ENCOUNTER — Telehealth: Payer: Self-pay | Admitting: Internal Medicine

## 2019-01-28 NOTE — Telephone Encounter (Signed)
Called and spoke with Pamala Hurry in Cardiopulmonary and PFT has been canceled. Nothing else needed at this time. Rhonda J Cobb

## 2019-01-28 NOTE — Telephone Encounter (Signed)
Have you reached this patient yet?

## 2019-01-28 NOTE — Telephone Encounter (Signed)
Spoke to pt, who stated that he would like to cancel PFT, as his breathing has improved.   Rhonda, please advise. Thanks

## 2019-01-29 ENCOUNTER — Ambulatory Visit: Payer: BLUE CROSS/BLUE SHIELD

## 2019-01-30 NOTE — Telephone Encounter (Signed)
Spoke with pt and he stated that this is not needed.

## 2019-02-25 ENCOUNTER — Other Ambulatory Visit: Payer: Self-pay

## 2019-02-25 ENCOUNTER — Ambulatory Visit (INDEPENDENT_AMBULATORY_CARE_PROVIDER_SITE_OTHER): Payer: BLUE CROSS/BLUE SHIELD | Admitting: Internal Medicine

## 2019-02-25 ENCOUNTER — Encounter: Payer: Self-pay | Admitting: Internal Medicine

## 2019-02-25 DIAGNOSIS — J4520 Mild intermittent asthma, uncomplicated: Secondary | ICD-10-CM | POA: Diagnosis not present

## 2019-02-25 DIAGNOSIS — K219 Gastro-esophageal reflux disease without esophagitis: Secondary | ICD-10-CM | POA: Diagnosis not present

## 2019-02-25 NOTE — Progress Notes (Signed)
PULMONARY OFFICE FOLLOW UP NOTE  Requesting MD/Service: Golden Pop, MD Date of initial consultation: 08/14/18 Reason for consultation: chronic cough    I connected with the patient by telephone enabled telemedicine visit and verified that I am speaking with the correct person using two identifiers.    I discussed the limitations, risks, security and privacy concerns of performing an evaluation and management service by telemedicine and the availability of in-person appointments. I also discussed with the patient that there may be a patient responsible charge related to this service. The patient expressed understanding and agreed to proceed.  PATIENT AGREES AND CONFIRMS -YES   Other persons participating in the visit and their role in the encounter: Patient, nursing  This visit type was conducted due to national recommendations for restrictions regarding the COVID-19 Pandemic (e.g. social distancing).  This format is felt to be most appropriate for this patient at this time.  All issues noted in this document were discussed and addressed.        02/25/2019 Date of OV  PT PROFILE: 43 y.o. male former regular light smoker, EX  smoker/vaper referred for evaluation of chronic cough Patients  son tested positive for SARS-CoV-2.  His son was asymptomatic.  However, patient had symptoms suggestive of COVID-19 (shortness of breath, headaches, loss of taste/smell, fatigue).  He has now fully recovered from the symptoms.  He states that overall he is "fine" and "everything has resolved" itself.    INTERVAL: Initial consultation 08/14/18 for chronic cough, possible asthma. Treated with smoking/vaping cessation, PPI, ICS, loratadine, cough suppressant.    CC Follow up reactive airways disease Follow GERD  HPI Patient was stated on PPI, Claritan, ALB inh and Flovent for reactive airways disease Patients symptoms have resolved with PPI and Claritan   Noexacerbation at this time No  evidence of heart failure at this time No evidence or signs of infection at this time No respiratory distress No fevers, chills, nausea, vomiting, diarrhea No evidence of lower extremity edema No evidence hemoptysis        There were no vitals filed for this visit.  Review of Systems:  Gen:  Denies  fever, sweats, chills weight loss  HEENT: Denies blurred vision, double vision, ear pain, eye pain, hearing loss, nose bleeds, sore throat Cardiac:  No dizziness, chest pain or heaviness, chest tightness,edema, No JVD Resp:   No cough, -sputum production, -shortness of breath,-wheezing, -hemoptysis,  Gi: Denies swallowing difficulty, stomach pain, nausea or vomiting, diarrhea, constipation, bowel incontinence Gu:  Denies bladder incontinence, burning urine Ext:   Denies Joint pain, stiffness or swelling Skin: Denies  skin rash, easy bruising or bleeding or hives Endoc:  Denies polyuria, polydipsia , polyphagia or weight change Psych:   Denies depression, insomnia or hallucinations  Other:  All other systems negative    DATA:   BMP Latest Ref Rng & Units 12/18/2018 11/20/2018 02/13/2018  Glucose 65 - 99 mg/dL 98 92 93  BUN 6 - 24 mg/dL 14 14 11   Creatinine 0.76 - 1.27 mg/dL 1.16 1.16 1.00  BUN/Creat Ratio 9 - 20 12 12 11   Sodium 134 - 144 mmol/L 139 140 142  Potassium 3.5 - 5.2 mmol/L 4.6 4.6 4.3  Chloride 96 - 106 mmol/L 99 96 102  CO2 20 - 29 mmol/L 22 25 22   Calcium 8.7 - 10.2 mg/dL 9.8 10.1 9.2     IMPRESSION:   Reactive airways disease from GERD and Allergic rhinitis with recent COVID infection Cough has resolved  Continue  albuterol as needed Avoid triggers Continue PPI and Loratadine  COVID-19 EDUCATION: The signs and symptoms of COVID-19 were discussed with the patient and how to seek care for testing.  The importance of social distancing was discussed today. Hand Washing Techniques and avoid touching face was advised.     MEDICATION ADJUSTMENTS/LABS AND  TESTS ORDERED: Continue PPI and Claritan   CURRENT MEDICATIONS REVIEWED AT LENGTH WITH PATIENT TODAY   Patient satisfied with Plan of action and management. All questions answered  Follow up in 1 year Total Time Spent 23 mins   Synthia [place] Patricia Pesa, M.D.  [facility]  Medical Director [place] Director [facility] Cardio-Pulmonary Department

## 2019-02-25 NOTE — Patient Instructions (Signed)
Continue albuterol as needed Avoid triggers Continue PPI and Loratadine

## 2019-02-27 ENCOUNTER — Telehealth: Payer: Self-pay | Admitting: Internal Medicine

## 2019-03-11 ENCOUNTER — Other Ambulatory Visit: Payer: Self-pay

## 2019-03-11 DIAGNOSIS — M10071 Idiopathic gout, right ankle and foot: Secondary | ICD-10-CM

## 2019-03-11 MED ORDER — ALLOPURINOL 300 MG PO TABS: 300.0000 mg | ORAL_TABLET | Freq: Every day | ORAL | 0 refills | Status: DC

## 2019-03-11 NOTE — Telephone Encounter (Signed)
Patient last seen 12/18/18 and has f/up 06/18/19

## 2019-03-12 ENCOUNTER — Other Ambulatory Visit: Payer: BLUE CROSS/BLUE SHIELD

## 2019-03-12 ENCOUNTER — Other Ambulatory Visit: Payer: Self-pay

## 2019-03-12 DIAGNOSIS — Z302 Encounter for sterilization: Secondary | ICD-10-CM

## 2019-03-12 DIAGNOSIS — Z9852 Vasectomy status: Secondary | ICD-10-CM

## 2019-03-13 LAB — POST-VAS SPERM EVALUATION,QUAL: Volume: 2.2 mL

## 2019-03-18 ENCOUNTER — Other Ambulatory Visit: Payer: Self-pay | Admitting: Urology

## 2019-03-18 DIAGNOSIS — Z9852 Vasectomy status: Secondary | ICD-10-CM

## 2019-03-20 ENCOUNTER — Telehealth: Payer: Self-pay

## 2019-03-20 NOTE — Telephone Encounter (Signed)
-----   Message from [place], [place] sent at 03/19/2019  4:02 PM EST ----- Can you take care of this, I know the patient and wife. Thanks  ----- Message ----- From: Abbie Sons, MD Sent: 03/18/2019  11:28 AM EST To: Gordy Clement, CMA  Postvasectomy semen analysis showed sperm present.  Continue on contraception.  Schedule repeat quantitative semen analysis 1 month.  Order was entered.

## 2019-04-04 NOTE — Telephone Encounter (Signed)
Mychart sent.

## 2019-04-05 NOTE — Telephone Encounter (Signed)
Left message to make patient apt, mychart was seen by patient

## 2019-04-10 ENCOUNTER — Other Ambulatory Visit: Payer: Self-pay | Admitting: Psychiatry

## 2019-04-10 DIAGNOSIS — F3181 Bipolar II disorder: Secondary | ICD-10-CM

## 2019-04-25 NOTE — Telephone Encounter (Signed)
Patient's August 13th visit was denied as out of network.  Sent an inquiry to our credentialling department to find out what is going on with this.

## 2019-04-26 ENCOUNTER — Other Ambulatory Visit: Payer: BLUE CROSS/BLUE SHIELD

## 2019-05-02 ENCOUNTER — Encounter: Payer: Self-pay | Admitting: Psychiatry

## 2019-05-02 ENCOUNTER — Other Ambulatory Visit: Payer: Self-pay

## 2019-05-02 ENCOUNTER — Ambulatory Visit (INDEPENDENT_AMBULATORY_CARE_PROVIDER_SITE_OTHER): Payer: 59 | Admitting: Psychiatry

## 2019-05-02 DIAGNOSIS — F3181 Bipolar II disorder: Secondary | ICD-10-CM

## 2019-05-02 DIAGNOSIS — F422 Mixed obsessional thoughts and acts: Secondary | ICD-10-CM

## 2019-05-02 DIAGNOSIS — F411 Generalized anxiety disorder: Secondary | ICD-10-CM | POA: Diagnosis not present

## 2019-05-02 MED ORDER — FLUVOXAMINE MALEATE 100 MG PO TABS: 100.0000 mg | ORAL_TABLET | Freq: Every day | ORAL | 1 refills | Status: DC

## 2019-05-02 MED ORDER — LAMOTRIGINE 25 MG PO TABS: 75.0000 mg | ORAL_TABLET | Freq: Every day | ORAL | 1 refills | Status: DC

## 2019-05-02 MED ORDER — DIVALPROEX SODIUM ER 500 MG PO TB24: 1000.0000 mg | ORAL_TABLET | Freq: Every day | ORAL | 1 refills | Status: DC

## 2019-05-02 NOTE — Progress Notes (Addendum)
Rodney Peters [id number] March 19, 1976 44 y.o.  Subjective:   Patient ID:  Rodney Peters is a 44 y.o. (DOB 09-Oct-1975) male.  Chief Complaint:  Chief Complaint  Patient presents with  . Follow-up    bipolar and OCD   HPI Rodney Peters presents to the office today for follow-up of bipolar and anxiety.  Last visit was in August 2020 with no med changes.  He was doing reasonably well with rapid cycling bipolar disorder and OCD with Depakote and fluvoxamine.   Been doing fantastic.  Really pleased with meds.  Stressful several months with Covid and I've been fine.  Managing well.   Dx severe OSA December an on  CPAP.  Remarkable.  Energy better.  Had dreaded going to bed  Before and now no problem.  More alert.  Less wakefulness.  Still some breakthrough depressive periods and more since last here.    Wife notices  And thinks that fluvoxamine has calmed him into being less bullying and less likely to argue or let the other person win an argument and wasn't that way in the past.   Wife a little nervous about the change and said he was checked out.  He says that's not true and that he's more checked in but less driven to  Prove himself to others.  Much more at ease.  Less obsessing over emails.  Quicker with work.  Feels less pressure. Feels overall a lot better, much calmer, and even better than last time.  Drowsiness still annoying but manageable.  Benefit from Luvox added (on 100mg  for awhile), despite external stressors being tough.  Has seen some improvement is OCD.  Less rewriting.  Less anxiety around it.  Had severe panic attack, brutal, unusual after 4 days of half dose Depakote.  Patient reports stable mood and denies irritable moods. Mood swings managed. Patient reorts difficulty with anxiety but it's better.  OCD is better not gone.  Patient denies difficulty with sleep initiation or maintenance but vivid disturbing dreams. Denies appetite disturbance.  Patient reports that energy  and motivation have been good.  Patient denies any difficulty with concentration other than distraction from OCD.  Patient denies any suicidal ideation.  Clear benefit from each of the meds added.  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  Wife complains that fluvoxamine seems to make him a little more curt and apt to be dismissive of disagreements.  Concerned about friend who needs to see me and is about to lose his wife and kids for variety of problems including alcohol and other problems.  Past Psychiatric Medication Trials:  VPA, Lamotrigine, fluvoxamine 100  Review of Systems:  Review of Systems  Neurological: Negative for dizziness, tremors and weakness.  Psychiatric/Behavioral: Negative for agitation, behavioral problems, confusion, decreased concentration, dysphoric mood, hallucinations, self-injury, sleep disturbance and suicidal ideas. The patient is not nervous/anxious and is not hyperactive.     Medications: I have reviewed the patient's current medications.  Current Outpatient Medications  Medication Sig Dispense Refill  . albuterol (VENTOLIN HFA) 108 (90 Base) MCG/ACT inhaler Inhale 2 puffs into the lungs every 6 (six) hours as needed for wheezing or shortness of breath. 18 g 5  . allopurinol (ZYLOPRIM) 300 MG tablet Take 1 tablet (300 mg total) by mouth daily. 90 tablet 0  . colchicine 0.6 MG tablet Take 1 tablet (0.6 mg total) by mouth daily. May take an extra tab for flair 30 tablet 4  .  divalproex (DEPAKOTE ER) 500 MG 24 hr tablet Take 2 tablets (1,000 mg total) by mouth daily. 180 tablet 1  . fluvoxaMINE (LUVOX) 100 MG tablet Take 1 tablet (100 mg total) by mouth at bedtime. 90 tablet 1  . lamoTRIgine (LAMICTAL) 25 MG tablet Take 3 tablets (75 mg total) by mouth daily. 270 tablet 1  . lisinopril (ZESTRIL) 10 MG tablet Take 1 tablet (10 mg total) by mouth daily. 90 tablet 1  . loratadine (CLARITIN) 10 MG tablet Take 1 tablet (10 mg total) by mouth  daily. 30 tablet 11  . Multiple Vitamin (MULTIVITAMIN) tablet Take 1 tablet by mouth daily.    Marland Kitchen omeprazole (PRILOSEC) 40 MG capsule Take 40 mg by mouth daily.    Marland Kitchen VITAMIN D, CHOLECALCIFEROL, PO Take 5,000 Units by mouth daily.     Marland Kitchen Bioflavonoid Products (ESTER-C) 500-200-60 MG TABS     . fluticasone (FLONASE) 50 MCG/ACT nasal spray Place 2 sprays into both nostrils daily. 16 g 6  . fluticasone (FLOVENT HFA) 110 MCG/ACT inhaler Inhale 2 puffs into the lungs 2 (two) times daily. Rinse mouth thoroughly after use 1 Inhaler 10  . HYDROcodone-acetaminophen (NORCO/VICODIN) 5-325 MG tablet Take 1 tablet by mouth every 4 (four) hours as needed for moderate pain. (Patient not taking: Reported on 12/31/2018) 10 tablet 0   No current facility-administered medications for this visit.    Medication Side Effects: Sedation manageable. Vivid dreaming from lamotrigine is some better.  Wouldn't trade the gains for the drowsiness.  Allergies: No Known Allergies  Past Medical History:  Diagnosis Date  . Abnormal TSH   . Anxiety   . Bipolar disorder ([place])   . GERD (gastroesophageal reflux disease)   . History of mononucleosis   . Hyperglycemia   . Lymphocytosis   . Vitamin B 12 deficiency   . Vitamin D deficiency     Family History  Problem Relation Age of Onset  . Anxiety disorder Father   . COPD Father   . Other Sister        substance abuse  . Cancer Maternal Grandmother   . Hypertension Maternal Grandfather   . Cancer Paternal Grandfather     Social History   Socioeconomic History  . Marital status: Married    Spouse name: Not on file  . Number of children: Not on file  . Years of education: Not on file  . Highest education level: Not on file  Occupational History  . Not on file  Tobacco Use  . Smoking status: Current Some Day Smoker    Types: Cigarettes    Start date: 85    Last attempt to quit: 12/19/2012    Years since quitting: 6.3  . Smokeless tobacco: Former Systems developer     Types: Chew    Quit date: 2010  . Tobacco comment: would smoke 2 packs every 3 weeks, currently 1 a week or so  Substance and Sexual Activity  . Alcohol use: Yes    Alcohol/week: 6.0 standard drinks    Types: 6 Standard drinks or equivalent per week    Comment: weekly  . Drug use: No  . Sexual activity: Yes    Birth control/protection: None  Other Topics Concern  . Not on file  Social History Narrative  . Not on file   Social Determinants of Health   Financial Resource Strain:   . Difficulty of Paying Living Expenses: Not on file  Food Insecurity:   . Worried About Charity fundraiser  in the Last Year: Not on file  . Ran Out of Food in the Last Year: Not on file  Transportation Needs:   . Lack of Transportation (Medical): Not on file  . Lack of Transportation (Non-Medical): Not on file  Physical Activity:   . Days of Exercise per Week: Not on file  . Minutes of Exercise per Session: Not on file  Stress:   . Feeling of Stress : Not on file  Social Connections:   . Frequency of Communication with Friends and Family: Not on file  . Frequency of Social Gatherings with Friends and Family: Not on file  . Attends Religious Services: Not on file  . Active Member of Clubs or Organizations: Not on file  . Attends Archivist Meetings: Not on file  . Marital Status: Not on file  Intimate Partner Violence:   . Fear of Current or Ex-Partner: Not on file  . Emotionally Abused: Not on file  . Physically Abused: Not on file  . Sexually Abused: Not on file    Past Medical History, Surgical history, Social history, and Family history were reviewed and updated as appropriate.   Involved in church.  Please see review of systems for further details on the patient's review from today.   Objective:   Physical Exam:  There were no vitals taken for this visit.  Physical Exam Constitutional:      General: He is not in acute distress.    Appearance: He is well-developed.   Musculoskeletal:        General: No deformity.  Neurological:     Mental Status: He is alert and oriented to person, place, and time.     Motor: No tremor.     Coordination: Coordination normal.     Gait: Gait normal.  Psychiatric:        Attention and Perception: He is attentive.        Mood and Affect: Mood is depressed. Mood is not anxious. Affect is not labile, blunt, angry or inappropriate.        Speech: Speech normal.        Behavior: Behavior normal.        Thought Content: Thought content normal. Thought content does not include homicidal or suicidal ideation. Thought content does not include homicidal or suicidal plan.        Cognition and Memory: Cognition normal.        Judgment: Judgment normal.     Comments: Insight intact. No auditory or visual hallucinations. OCD present but manageable.  Much calmer than he's ever been.     Lab Review:     Component Value Date/Time   NA 139 12/18/2018 0947   K 4.6 12/18/2018 0947   CL 99 12/18/2018 0947   CO2 22 12/18/2018 0947   GLUCOSE 98 12/18/2018 0947   BUN 14 12/18/2018 0947   CREATININE 1.16 12/18/2018 0947   CALCIUM 9.8 12/18/2018 0947   PROT 6.5 08/16/2016 0856   ALBUMIN 4.4 08/16/2016 0856   AST 20 08/16/2016 0856   ALT 25 08/16/2016 0856   ALKPHOS 51 08/16/2016 0856   BILITOT 0.4 08/16/2016 0856   GFRNONAA 77 12/18/2018 0947   GFRAA 89 12/18/2018 0947       Component Value Date/Time   WBC 9.0 08/16/2016 0856   RBC 5.40 08/16/2016 0856   HGB 15.7 08/16/2016 0856   HCT 45.6 08/16/2016 0856   PLT 220 08/16/2016 0856   MCV 84 08/16/2016 0856  MCH 29.1 08/16/2016 0856   MCHC 34.4 08/16/2016 0856   RDW 14.1 08/16/2016 0856   LYMPHSABS 3.6 (H) 08/16/2016 0856   EOSABS 0.4 08/16/2016 0856   BASOSABS 0.0 08/16/2016 0856    No results found for: POCLITH, LITHIUM   No results found for: PHENYTOIN, PHENOBARB, VALPROATE, CBMZ   Remote VPA 63 on 750mg /d Oct 2018 and LFT's stable.  .res Assessment:  Plan:    Rodney Peters was seen today for follow-up.  Diagnoses and all orders for this visit:  Bipolar II disorder ([place]) -     lamoTRIgine (LAMICTAL) 25 MG tablet; Take 3 tablets (75 mg total) by mouth daily. -     divalproex (DEPAKOTE ER) 500 MG 24 hr tablet; Take 2 tablets (1,000 mg total) by mouth daily.  Mixed obsessional thoughts and acts -     lamoTRIgine (LAMICTAL) 25 MG tablet; Take 3 tablets (75 mg total) by mouth daily. -     fluvoxaMINE (LUVOX) 100 MG tablet; Take 1 tablet (100 mg total) by mouth at bedtime.  Generalized anxiety disorder -     fluvoxaMINE (LUVOX) 100 MG tablet; Take 1 tablet (100 mg total) by mouth at bedtime.    Answered questions about trazodone for sleep and risk of adverse mood effects unlikely but possible.  Patient's rapid cycling bipolar disorder is much better controlled with the Depakote and lamotrigine he is tolerating it reasonably well with some morning drowsiness which is manageable.  His anger is manageable at this time.  Try Depakote at night and see if sleepiness is better, apparently that is the cause.  L-carnitine 1000 mg twice daily for Depakote related fogginess  Option check ammonia level discussed  For depression increase lamotrigine to 75 mg daily.  OCD is much improved and further improved since last visit also though not resolved.  He is been on fluvoxamine 9 months or so.  We discussed how it may take 2-3 months to get the full benefit of fluvoxamine.  Because he is having some morning drowsiness we will defer an increase though he is taking lower than the usual doses used for OCD.  He is aware of that we have the option to go up.  Feels much calmer than ever.  Call if there are worsening symptoms.  May have to explain to wife the reason for the emotional changes to which she needs to adjust.  Changes are healthier.  Switch all meds to PM to reduce drowsiness if possible.  Continue Depakote 1000 and Luvox 100 mg daily.  This appt was  30 mins.  Follow-up 6 mos  Lynder Parents MD, DFAPA  Future Appointments  Date Time Provider [place]  05/03/2019  8:15 AM [facility] None  06/18/2019  8:00 AM Johnson, Megan P, DO [facility]    No orders of the defined types were placed in this encounter.     -------------------------------

## 2019-05-02 NOTE — Patient Instructions (Signed)
L-carnitine 1000 mg twice daily for Depakote related fogginess   Increase lamotrigine 25 mg tablets to 3 daily

## 2019-05-03 ENCOUNTER — Encounter: Payer: Self-pay | Admitting: Urology

## 2019-05-03 ENCOUNTER — Other Ambulatory Visit: Payer: Self-pay

## 2019-05-17 ENCOUNTER — Other Ambulatory Visit: Payer: Self-pay

## 2019-05-17 ENCOUNTER — Other Ambulatory Visit: Payer: 59

## 2019-05-17 DIAGNOSIS — Z9852 Vasectomy status: Secondary | ICD-10-CM

## 2019-05-18 LAB — POST-VAS SPERM EVALUATION,QUAL: Volume: 4 mL

## 2019-05-18 LAB — TEST CODE CHANGE

## 2019-05-20 ENCOUNTER — Telehealth: Payer: Self-pay | Admitting: *Deleted

## 2019-05-20 NOTE — Telephone Encounter (Signed)
-----   Message from Abbie Sons, MD sent at 05/19/2019 12:58 PM EST ----- No sperm seen on semen analysis.  Okay to use vasectomy as primary contraception

## 2019-05-20 NOTE — Telephone Encounter (Signed)
Notified patient as instructed, patient pleased. Discussed follow-up appointments, patient agrees  

## 2019-06-08 ENCOUNTER — Other Ambulatory Visit: Payer: Self-pay | Admitting: Family Medicine

## 2019-06-08 DIAGNOSIS — M10071 Idiopathic gout, right ankle and foot: Secondary | ICD-10-CM

## 2019-06-08 NOTE — Telephone Encounter (Signed)
Pt given enough refill to last until appt. Requested Prescriptions  Pending Prescriptions Disp Refills  . allopurinol (ZYLOPRIM) 300 MG tablet [Pharmacy Med Name: ALLOPURINOL 300 MG TABLET] 10 tablet 0    Sig: TAKE 1 TABLET BY MOUTH EVERY DAY     Endocrinology:  Gout Agents Failed - 06/08/2019  9:47 AM      Failed - Uric Acid in normal range and within 360 days    Uric Acid  Date Value Ref Range Status  02/13/2018 5.4 3.7 - 8.6 mg/dL Final    Comment:               Therapeutic target for gout patients: <6.0         Passed - Cr in normal range and within 360 days    Creatinine, Ser  Date Value Ref Range Status  12/18/2018 1.16 0.76 - 1.27 mg/dL Final         Passed - Valid encounter within last 12 months    Recent Outpatient Visits          5 months ago Chronic cough   [place], [place], DO   5 months ago Essential hypertension   [place], [place], DO   6 months ago Essential hypertension   [place], Megan P, DO   10 months ago Cough in adult   [place], Jeannette How, MD   1 year ago Lower respiratory infection   [place], Lilia Argue, [place]      Future Appointments            In 1 week Wynetta Emery, Barb Merino, DO [employer], [place]

## 2019-06-18 ENCOUNTER — Encounter: Payer: BLUE CROSS/BLUE SHIELD | Admitting: Family Medicine

## 2019-07-02 ENCOUNTER — Other Ambulatory Visit: Payer: Self-pay | Admitting: Psychiatry

## 2019-07-02 DIAGNOSIS — F3181 Bipolar II disorder: Secondary | ICD-10-CM

## 2019-07-02 DIAGNOSIS — F422 Mixed obsessional thoughts and acts: Secondary | ICD-10-CM

## 2019-07-31 ENCOUNTER — Other Ambulatory Visit: Payer: Self-pay | Admitting: Internal Medicine

## 2019-07-31 MED ORDER — LORATADINE 10 MG PO TABS: 10.0000 mg | ORAL_TABLET | Freq: Every day | ORAL | 6 refills | Status: DC

## 2019-08-14 ENCOUNTER — Telehealth: Payer: 59 | Admitting: Family

## 2019-08-14 DIAGNOSIS — J45901 Unspecified asthma with (acute) exacerbation: Secondary | ICD-10-CM

## 2019-08-14 MED ORDER — PREDNISONE 20 MG PO TABS: 40.0000 mg | ORAL_TABLET | Freq: Every day | ORAL | 0 refills | Status: AC

## 2019-08-14 MED ORDER — ALBUTEROL SULFATE HFA 108 (90 BASE) MCG/ACT IN AERS: 2.0000 | INHALATION_SPRAY | Freq: Four times a day (QID) | RESPIRATORY_TRACT | 5 refills | Status: DC | PRN

## 2019-08-14 MED ORDER — AZITHROMYCIN 250 MG PO TABS: ORAL_TABLET | ORAL | 0 refills | Status: DC

## 2019-08-14 NOTE — Progress Notes (Signed)
Visit for Asthma  Based on what you have shared with me, it looks like you may have a flare up of your asthma.  Asthma is a chronic (ongoing) lung disease which results in airway obstruction, inflammation and hyper-responsiveness.   Asthma symptoms vary from person to person, with common symptoms including nighttime awakening and decreased ability to participate in normal activities as a result of shortness of breath. It is often triggered by changes in weather, changes in the season, changes in air temperature, or inside (home, school, daycare or work) allergens such as animal dander, mold, mildew, woodstoves or cockroaches.   It can also be triggered by hormonal changes, extreme emotion, physical exertion or an upper respiratory tract illness.     It is important to identify the trigger, and then eliminate or avoid the trigger if possible.   If you have been prescribed medications to be taken on a regular basis, it is important to follow the asthma action plan and to follow guidelines to adjust medication in response to increasing symptoms of decreased peak expiratory flow rate  Treatment: I have prescribed: Albuterol (Proventil HFA; Ventolin HFA) 108 (90 Base) MCG/ACT Inhaler 2 puffs into the lungs every six hours as needed for wheezing or shortness of breath and Prednisone 40mg  by mouth per day for  7 days and I have sent in a Zpak to your pharmacy.   HOME CARE . Only take medications as instructed by your medical team. . Consider wearing a mask or scarf to improve breathing air temperature have been shown to decrease irritation and decrease exacerbations . Get rest. . Taking a steamy shower or using a humidifier may help nasal congestion sand ease sore throat pain. You can place a towel over your head and breathe in the steam from hot water coming from a faucet. . Using a saline  nasal spray works much the same way.  . Cough drops, hare candies and sore throat lozenges may ease your cough.  . Avoid close contacts especially the very you and the elderly . Cover your mouth if you cough or sneeze . Always remember to wash your hands.    GET HELP RIGHT AWAY IF: . You develop worsening symptoms; breathlessness at rest, drowsy, confused or agitated, unable to speak in full sentences . You have coughing fits . You develop a severe headache or visual changes . You develop shortness of breath, difficulty breathing or start having chest pain . Your symptoms persist after you have completed your treatment plan . If your symptoms do not improve within 10 days  MAKE SURE YOU . Understand these instructions. . Will watch your condition. . Will get help right away if you are not doing well or get worse.   Your e-visit answers were reviewed by a board certified advanced clinical practitioner to complete your personal care plan, Depending upon the condition, your plan could have included both over the counter or prescription medications.  Please review your pharmacy choice. Your safety is important to [place]. If you have drug allergies check your prescription carefully. You can use MyChart to ask questions about today's visit, request a non-urgent call back, or ask for a work or school excuse for 24 hours related to this e-Visit. If it has been greater than 24 hours you will need to follow up with your provider, or enter a new e-Visit to address those concerns.  You will get an e-mail in the next two days asking about your experience. I hope that  that your e-visit has been valuable and will speed your recovery. Thank you for using e-visits.  Approximately 5 minutes was spent documenting and reviewing patient's chart.    

## 2019-09-07 ENCOUNTER — Other Ambulatory Visit: Payer: Self-pay | Admitting: Family Medicine

## 2019-09-07 NOTE — Telephone Encounter (Signed)
30 day courtesy RF Requested Prescriptions  Pending Prescriptions Disp Refills  . lisinopril (ZESTRIL) 10 MG tablet [Pharmacy Med Name: LISINOPRIL 10 MG TABLET] 30 tablet 0    Sig: TAKE 1 TABLET BY MOUTH EVERY DAY     Cardiovascular:  ACE Inhibitors Failed - 09/07/2019  9:33 AM      Failed - Cr in normal range and within 180 days    Creatinine, Ser  Date Value Ref Range Status  12/18/2018 1.16 0.76 - 1.27 mg/dL Final         Failed - K in normal range and within 180 days    Potassium  Date Value Ref Range Status  12/18/2018 4.6 3.5 - 5.2 mmol/L Final         Failed - Valid encounter within last 6 months    Recent Outpatient Visits          8 months ago Chronic cough   [facility], Megan P, DO   8 months ago Essential hypertension   [place], [place], DO   9 months ago Essential hypertension   [place], [place], DO   1 year ago Cough in adult   [place], Jeannette How, MD   1 year ago Lower respiratory infection   [place], [place], [place]             Passed - Patient is not pregnant      Passed - Last BP in normal range    BP Readings from Last 1 Encounters:  12/19/18 132/89

## 2019-09-22 ENCOUNTER — Other Ambulatory Visit: Payer: Self-pay | Admitting: Family Medicine

## 2019-10-02 ENCOUNTER — Other Ambulatory Visit: Payer: Self-pay

## 2019-10-02 MED ORDER — OMEPRAZOLE 40 MG PO CPDR: 40.0000 mg | DELAYED_RELEASE_CAPSULE | Freq: Every day | ORAL | 2 refills | Status: DC

## 2019-10-02 NOTE — Telephone Encounter (Signed)
rx for omeprazole 40 has been sent to [facility] as requested by pharmacy.

## 2019-10-11 ENCOUNTER — Telehealth: Payer: Self-pay

## 2019-10-11 NOTE — Telephone Encounter (Signed)
Copied from [place] [phone]. Topic: Medical Record Request - Patient ROI Request >> Oct 10, 2019  2:23 PM Celene Kras wrote: Patient Name/DOB/MRN #: Rodney Peters// May 15, 1975// [id number] Requestor Name/Agency: Patient// [employer] Call Back #: [phone]  Information Requested: Pt states that he is needing to have any office notes regarding his need for a CPAP machine. Please advise.    Route to [employer] for [employer]. For all other clinics, route to the clinic's PEC Pool. >> Oct 11, 2019 11:54 AM Georgina Peer, CMA wrote: Called and spoke to patient. He states he is needing to get a CPAP through [place]. He states that he sent [place] a fax yesterday afternoon for [place] to fill out and send to them along with his office visit note regarding sleep apnea. Also called and got sleep study report from [facility] as we did not have that on file. Informed patient that we did not have the form from [place] that he faxed so he is refaxing it to [place] now. Will await form and proceed from there.    Called and spoke to patient. Let him know that I still have not received his form from [place] to fill out. Asked patient if he could bring the form by here and he said that he would.

## 2019-10-31 ENCOUNTER — Ambulatory Visit: Payer: 59 | Admitting: Psychiatry

## 2019-11-28 ENCOUNTER — Other Ambulatory Visit: Payer: Self-pay | Admitting: Psychiatry

## 2019-11-28 DIAGNOSIS — F3181 Bipolar II disorder: Secondary | ICD-10-CM

## 2019-11-28 DIAGNOSIS — F422 Mixed obsessional thoughts and acts: Secondary | ICD-10-CM

## 2019-11-28 NOTE — Telephone Encounter (Signed)
Please review

## 2019-12-26 ENCOUNTER — Other Ambulatory Visit: Payer: Self-pay

## 2019-12-26 ENCOUNTER — Ambulatory Visit (INDEPENDENT_AMBULATORY_CARE_PROVIDER_SITE_OTHER): Payer: 59 | Admitting: Nurse Practitioner

## 2019-12-26 ENCOUNTER — Other Ambulatory Visit: Payer: Self-pay | Admitting: Internal Medicine

## 2019-12-26 ENCOUNTER — Encounter: Payer: Self-pay | Admitting: Nurse Practitioner

## 2019-12-26 VITALS — BP 120/84 | HR 87 | Temp 98.0°F | Resp 16 | Ht 70.0 in | Wt 269.8 lb

## 2019-12-26 DIAGNOSIS — M10071 Idiopathic gout, right ankle and foot: Secondary | ICD-10-CM | POA: Diagnosis not present

## 2019-12-26 DIAGNOSIS — I10 Essential (primary) hypertension: Secondary | ICD-10-CM

## 2019-12-26 DIAGNOSIS — Z6838 Body mass index (BMI) 38.0-38.9, adult: Secondary | ICD-10-CM

## 2019-12-26 DIAGNOSIS — F17210 Nicotine dependence, cigarettes, uncomplicated: Secondary | ICD-10-CM | POA: Diagnosis not present

## 2019-12-26 DIAGNOSIS — E559 Vitamin D deficiency, unspecified: Secondary | ICD-10-CM

## 2019-12-26 DIAGNOSIS — E6609 Other obesity due to excess calories: Secondary | ICD-10-CM

## 2019-12-26 DIAGNOSIS — E669 Obesity, unspecified: Secondary | ICD-10-CM | POA: Insufficient documentation

## 2019-12-26 MED ORDER — ALLOPURINOL 300 MG PO TABS: 300.0000 mg | ORAL_TABLET | Freq: Every day | ORAL | 4 refills | Status: DC

## 2019-12-26 MED ORDER — LISINOPRIL 10 MG PO TABS: 10.0000 mg | ORAL_TABLET | Freq: Every day | ORAL | 4 refills | Status: DC

## 2019-12-26 MED ORDER — COLCHICINE 0.6 MG PO TABS: 0.6000 mg | ORAL_TABLET | Freq: Every day | ORAL | 4 refills | Status: DC

## 2019-12-26 NOTE — Assessment & Plan Note (Signed)
I have recommended complete cessation of tobacco use. I have discussed various options available for assistance with tobacco cessation including over the counter methods (Nicotine gum, patch and lozenges). We also discussed prescription options (Chantix, Nicotine Inhaler / Nasal Spray). The patient is not interested in pursuing any prescription tobacco cessation options at this time.  

## 2019-12-26 NOTE — Assessment & Plan Note (Signed)
Recommended eating smaller high protein, low fat meals more frequently and exercising 30 mins a day 5 times a week with a goal of 10-15lb weight loss in the next 3 months. Patient voiced their understanding and motivation to adhere to these recommendations.  

## 2019-12-26 NOTE — Assessment & Plan Note (Signed)
Chronic, with current acute flare.  Will treat acute episode with Colchicine and restart his Allopurinol.  Uric acid level ordered today.

## 2019-12-26 NOTE — Patient Instructions (Signed)
 Gout  Gout is painful swelling of your joints. Gout is a type of arthritis. It is caused by having too much uric acid in your body. Uric acid is a chemical that is made when your body breaks down substances called purines. If your body has too much uric acid, sharp crystals can form and build up in your joints. This causes pain and swelling. Gout attacks can happen quickly and be very painful (acute gout). Over time, the attacks can affect more joints and happen more often (chronic gout). What are the causes?  Too much uric acid in your blood. This can happen because: ? Your kidneys do not remove enough uric acid from your blood. ? Your body makes too much uric acid. ? You eat too many foods that are high in purines. These foods include organ meats, some seafood, and beer.  Trauma or stress. What increases the risk?  Having a family history of gout.  Being male and middle-aged.  Being male and having gone through menopause.  Being very overweight (obese).  Drinking alcohol, especially beer.  Not having enough water in the body (being dehydrated).  Losing weight too quickly.  Having an organ transplant.  Having lead poisoning.  Taking certain medicines.  Having kidney disease.  Having a skin condition called psoriasis. What are the signs or symptoms? An attack of acute gout usually happens in just one joint. The most common place is the big toe. Attacks often start at night. Other joints that may be affected include joints of the feet, ankle, knee, fingers, wrist, or elbow. Symptoms of an attack may include:  Very bad pain.  Warmth.  Swelling.  Stiffness.  Shiny, red, or purple skin.  Tenderness. The affected joint may be very painful to touch.  Chills and fever. Chronic gout may cause symptoms more often. More joints may be involved. You may also have white or yellow lumps (tophi) on your hands or feet or in other areas near your joints. How is this  treated?  Treatment for this condition has two phases: treating an acute attack and preventing future attacks.  Acute gout treatment may include: ? NSAIDs. ? Steroids. These are taken by mouth or injected into a joint. ? Colchicine. This medicine relieves pain and swelling. It can be given by mouth or through an IV tube.  Preventive treatment may include: ? Taking small doses of NSAIDs or colchicine daily. ? Using a medicine that reduces uric acid levels in your blood. ? Making changes to your diet. You may need to see a food expert (dietitian) about what to eat and drink to prevent gout. Follow these instructions at home: During a gout attack   If told, put ice on the painful area: ? Put ice in a plastic bag. ? Place a towel between your skin and the bag. ? Leave the ice on for 20 minutes, 2-3 times a day.  Raise (elevate) the painful joint above the level of your heart as often as you can.  Rest the joint as much as possible. If the joint is in your leg, you may be given crutches.  Follow instructions from your doctor about what you cannot eat or drink. Avoiding future gout attacks  Eat a low-purine diet. Avoid foods and drinks such as: ? Liver. ? Kidney. ? Anchovies. ? Asparagus. ? Herring. ? Mushrooms. ? Mussels. ? Beer.  Stay at a healthy weight. If you want to lose weight, talk with your doctor. Do not lose   weight too fast.  Start or continue an exercise plan as told by your doctor. Eating and drinking  Drink enough fluids to keep your pee (urine) pale yellow.  If you drink alcohol: ? Limit how much you use to:  0-1 drink a day for women.  0-2 drinks a day for men. ? Be aware of how much alcohol is in your drink. In the [place] one drink equals one 12 oz bottle of beer (355 mL), one 5 oz glass of wine (148 mL), or one 1 oz glass of hard liquor (44 mL). General instructions  Take over-the-counter and prescription medicines only as told by your doctor.  Do  not drive or use heavy machinery while taking prescription pain medicine.  Return to your normal activities as told by your doctor. Ask your doctor what activities are safe for you.  Keep all follow-up visits as told by your doctor. This is important. Contact a doctor if:  You have another gout attack.  You still have symptoms of a gout attack after 10 days of treatment.  You have problems (side effects) because of your medicines.  You have chills or a fever.  You have burning pain when you pee (urinate).  You have pain in your lower back or belly. Get help right away if:  You have very bad pain.  Your pain cannot be controlled.  You cannot pee. Summary  Gout is painful swelling of the joints.  The most common site of pain is the big toe, but it can affect other joints.  Medicines and avoiding some foods can help to prevent and treat gout attacks. This information is not intended to replace advice given to you by your health care provider. Make sure you discuss any questions you have with your health care provider. Document Revised: 09/27/2017 Document Reviewed: 09/27/2017 Elsevier Patient Education  2020 [employer]  

## 2019-12-26 NOTE — Assessment & Plan Note (Signed)
Chronic, ongoing with BP at goal today.  Continue current medication regimen and adjust as needed.  Refills sent on Lisinopril.  Labs today to include BMP, TSH, Lipid.  Return in 6 months for physical.

## 2019-12-26 NOTE — Progress Notes (Addendum)
BP 120/84 (BP Location: Left Arm, Patient Position: Sitting, Cuff Size: Large)   Pulse 87   Temp 98 F (36.7 C)   Resp 16   Ht 5\' 10"  (1.778 m)   Wt 269 lb 12.8 oz (122.4 kg)   SpO2 97%   BMI 38.71 kg/m    Subjective:    Patient ID: Rodney Peters, male    DOB: Nov 05, 1975, 44 y.o.   MRN: [id number]  HPI: Rodney Peters is a 44 y.o. male  Chief Complaint  Patient presents with  . Gout   GOUT Ran out of Allopurinol several months ago and decided to trial without it.  Then yesterday morning started a flare, took some Colchicine.  Last uric acid level in 2019 was 5.4. Duration:days -- with underlying chronic Right 1st metatarsophalangeal pain: yes Left 1st metatarsophalangeal pain: no Right knee pain: no Left knee pain: no Severity: mild  Quality: dull and aching Swelling: yes Redness: yes Trauma: no Recent dietary change or indiscretion: yes Fevers: no Nausea/vomiting: no Aggravating factors: walking Alleviating factors: medication Status:  stable Treatments attempted: Colchicine  HYPERTENSION Continues on Lisinopril 10 MG, has been out of this for months.  Does smoke 1 pack per week. Hypertension status: stable  Satisfied with current treatment? yes Duration of hypertension: chronic BP monitoring frequency:  not checking BP range:  BP medication side effects:  no Medication compliance: fair compliance Aspirin: no Recurrent headaches: no Visual changes: no Palpitations: no Dyspnea: no Chest pain: no Lower extremity edema: no Dizzy/lightheaded: no  Relevant past medical, surgical, family and social history reviewed and updated as indicated. Interim medical history since our last visit reviewed. Allergies and medications reviewed and updated.  Review of Systems  Constitutional: Negative for activity change, diaphoresis, fatigue and fever.  Respiratory: Negative for cough, chest tightness, shortness of breath and wheezing.   Cardiovascular: Negative for  chest pain, palpitations and leg swelling.  Gastrointestinal: Negative.   Neurological: Negative.   Psychiatric/Behavioral: Negative.     Per HPI unless specifically indicated above     Objective:    BP 120/84 (BP Location: Left Arm, Patient Position: Sitting, Cuff Size: Large)   Pulse 87   Temp 98 F (36.7 C)   Resp 16   Ht 5\' 10"  (1.778 m)   Wt 269 lb 12.8 oz (122.4 kg)   SpO2 97%   BMI 38.71 kg/m   Wt Readings from Last 3 Encounters:  12/26/19 269 lb 12.8 oz (122.4 kg)  12/19/18 265 lb (120.2 kg)  12/18/18 267 lb (121.1 kg)    Physical Exam Vitals and nursing note reviewed.  Constitutional:      General: He is awake. He is not in acute distress.    Appearance: He is well-developed and well-groomed. He is obese. He is not ill-appearing.  HENT:     Head: Normocephalic and atraumatic.     Right Ear: Hearing normal. No drainage.     Left Ear: Hearing normal. No drainage.  Eyes:     General: Lids are normal.        Right eye: No discharge.        Left eye: No discharge.     Conjunctiva/sclera: Conjunctivae normal.     Pupils: Pupils are equal, round, and reactive to light.  Neck:     Thyroid: No thyromegaly.     Vascular: No carotid bruit or JVD.     Trachea: Trachea normal.  Cardiovascular:     Rate and Rhythm: Normal  rate and regular rhythm.     Pulses:          Dorsalis pedis pulses are 2+ on the right side and 2+ on the left side.       Posterior tibial pulses are 2+ on the right side and 2+ on the left side.     Heart sounds: Normal heart sounds, S1 normal and S2 normal. No murmur heard.  No gallop.   Pulmonary:     Effort: Pulmonary effort is normal. No accessory muscle usage or respiratory distress.     Breath sounds: Normal breath sounds.  Abdominal:     General: Bowel sounds are normal.     Palpations: Abdomen is soft. There is no hepatomegaly or splenomegaly.  Musculoskeletal:        General: Normal range of motion.     Cervical back: Normal range  of motion and neck supple.     Right lower leg: No edema.     Left lower leg: No edema.  Feet:     Right foot:     Skin integrity: Erythema (to great toe) present. No warmth.     Toenail Condition: Right toenails are normal.     Left foot:     Skin integrity: Skin integrity normal.     Toenail Condition: Left toenails are normal.     Comments: Right great toe with mild erythema and edema present.  No tenderness. Skin:    General: Skin is warm and dry.     Capillary Refill: Capillary refill takes less than 2 seconds.  Neurological:     Mental Status: He is alert and oriented to person, place, and time.  Psychiatric:        Attention and Perception: Attention normal.        Mood and Affect: Mood normal.        Speech: Speech normal.        Behavior: Behavior normal. Behavior is cooperative.        Thought Content: Thought content normal.     Results for orders placed or performed in visit on 05/17/19  Post-Vas Sperm Evaluation,Qual  Result Value Ref Range   Volume 4.0 Not Estab. mL   Post-Vas Sperm, Qual Comment Absent  Test Code Change  Result Value Ref Range   Test Code Change Comment       Assessment & Plan:   Problem List Items Addressed This Visit      Cardiovascular and Mediastinum   Essential hypertension    Chronic, ongoing with BP at goal today.  Continue current medication regimen and adjust as needed.  Refills sent on Lisinopril.  Labs today to include BMP, TSH, Lipid.  Return in 6 months for physical.         Relevant Medications   lisinopril (ZESTRIL) 10 MG tablet   Other Relevant Orders   Basic metabolic panel   TSH   Lipid Panel w/o Chol/HDL Ratio     Other   Gout - Primary    Chronic, with current acute flare.  Will treat acute episode with Colchicine and restart his Allopurinol.  Uric acid level ordered today.      Relevant Medications   colchicine 0.6 MG tablet   allopurinol (ZYLOPRIM) 300 MG tablet   Other Relevant Orders   Uric acid    Vitamin D deficiency    Ongoing, check Vitamin D level today and continue supplement.      Relevant Orders   VITAMIN D 25 Hydroxy (  Vit-D Deficiency, Fractures)   Nicotine dependence, cigarettes, uncomplicated    I have recommended complete cessation of tobacco use. I have discussed various options available for assistance with tobacco cessation including over the counter methods (Nicotine gum, patch and lozenges). We also discussed prescription options (Chantix, Nicotine Inhaler / Nasal Spray). The patient is not interested in pursuing any prescription tobacco cessation options at this time.       Obesity    Recommended eating smaller high protein, low fat meals more frequently and exercising 30 mins a day 5 times a week with a goal of 10-15lb weight loss in the next 3 months. Patient voiced their understanding and motivation to adhere to these recommendations.           Follow up plan: Return in about 6 months (around 06/25/2020) for Annual physical with new PCP.

## 2019-12-26 NOTE — Assessment & Plan Note (Signed)
Ongoing, check Vitamin D level today and continue supplement.

## 2019-12-27 ENCOUNTER — Other Ambulatory Visit: Payer: Self-pay | Admitting: Psychiatry

## 2019-12-27 DIAGNOSIS — F3181 Bipolar II disorder: Secondary | ICD-10-CM

## 2019-12-27 LAB — BASIC METABOLIC PANEL
BUN/Creatinine Ratio: 16 (ref 9–20)
BUN: 17 mg/dL (ref 6–24)
CO2: 23 mmol/L (ref 20–29)
Calcium: 9.5 mg/dL (ref 8.7–10.2)
Chloride: 98 mmol/L (ref 96–106)
Creatinine, Ser: 1.07 mg/dL (ref 0.76–1.27)
GFR calc Af Amer: 98 mL/min/{1.73_m2} (ref >59)
GFR calc non Af Amer: 85 mL/min/{1.73_m2} (ref >59)
Glucose: 117 mg/dL — ABNORMAL HIGH (ref 65–99)
Potassium: 4.2 mmol/L (ref 3.5–5.2)
Sodium: 137 mmol/L (ref 134–144)

## 2019-12-27 LAB — LIPID PANEL W/O CHOL/HDL RATIO
Cholesterol, Total: 294 mg/dL — ABNORMAL HIGH (ref 100–199)
HDL: 28 mg/dL — ABNORMAL LOW (ref >39)
Triglycerides: 948 mg/dL (ref 0–149)

## 2019-12-27 LAB — URIC ACID: Uric Acid: 7.9 mg/dL (ref 3.8–8.4)

## 2019-12-27 LAB — TSH: TSH: 1.55 u[IU]/mL (ref 0.450–4.500)

## 2019-12-27 LAB — VITAMIN D 25 HYDROXY (VIT D DEFICIENCY, FRACTURES)

## 2019-12-27 NOTE — Progress Notes (Signed)
Contacted via MyChart  Good afternoon Mr. Meyer Russel, some of your labs have returned.  I am still waiting on your Vitamin D level and will alert you when it returns: - On your BMP your glucose (sugar) level was a little high.  Would like to recheck this next visit.  Had you ate before visit, as your triglycerides were also very high on your cholesterol check?  These were 948.  With your current mood medication these can sometimes be elevated, but if you ate before visit this can elevate them too.  If you did eat could you come in over the next week in the morning to repeat these via an outpatient lab visit only?  I could have staff schedule this with you.  If they continue to be elevated we would need to start you on medication to help lower these, as elevation in triglycerides can lead to pancreatitis. - Uric acid level was a bit elevated from previous check, as expected with the current gout flare. -  Thyroid and kidney function are normal Please get back in touch with me about questions above.  Have a great day!! Keep being awesome!!  Thank you for allowing me to participate in your care. Kindest regards, Margeaux Swantek

## 2019-12-27 NOTE — Telephone Encounter (Signed)
Last apt 02/21 due 6 months

## 2020-01-13 ENCOUNTER — Other Ambulatory Visit: Payer: Self-pay

## 2020-01-13 ENCOUNTER — Ambulatory Visit (INDEPENDENT_AMBULATORY_CARE_PROVIDER_SITE_OTHER): Payer: 59 | Admitting: Psychiatry

## 2020-01-13 ENCOUNTER — Encounter: Payer: Self-pay | Admitting: Psychiatry

## 2020-01-13 DIAGNOSIS — F422 Mixed obsessional thoughts and acts: Secondary | ICD-10-CM | POA: Diagnosis not present

## 2020-01-13 DIAGNOSIS — F411 Generalized anxiety disorder: Secondary | ICD-10-CM

## 2020-01-13 DIAGNOSIS — F3181 Bipolar II disorder: Secondary | ICD-10-CM | POA: Diagnosis not present

## 2020-01-13 MED ORDER — DIVALPROEX SODIUM ER 500 MG PO TB24: 1500.0000 mg | ORAL_TABLET | Freq: Every evening | ORAL | 0 refills | Status: DC

## 2020-01-13 MED ORDER — FLUVOXAMINE MALEATE 100 MG PO TABS: 100.0000 mg | ORAL_TABLET | Freq: Every day | ORAL | 1 refills | Status: DC

## 2020-01-13 MED ORDER — LAMOTRIGINE 25 MG PO TABS: 75.0000 mg | ORAL_TABLET | Freq: Every day | ORAL | 0 refills | Status: DC

## 2020-01-13 NOTE — Progress Notes (Signed)
Rodney Peters [id number] 17-Oct-1975 44 y.o.  Subjective:   Patient ID:  Rodney Peters is a 44 y.o. (DOB 1976-03-15) male.  Chief Complaint:  Chief Complaint  Patient presents with  . Follow-up  . Manic Behavior  . Anxiety   HPI Rodney Peters presents to the office today for follow-up of bipolar and anxiety.  visit was in August 2020 with no med changes.  He was doing reasonably well with  apid cycling bipolar disorder and OCD with Depakote and fluvoxamine.   04/2019 appt with following noted: r Been doing fantastic.  Really pleased with meds.  Stressful several months with Covid and I've been fine.  Managing well.  Dx severe OSA December an on  CPAP.  Remarkable.  Energy better.  Had dreaded going to bed  Before and now no problem.  More alert.  Less wakefulness.  Still some breakthrough depressive periods and more since last here.   Wife notices  And thinks that fluvoxamine has calmed him into being less bullying and less likely to argue or let the other person win an argument and wasn't that way in the past.   Wife a little nervous about the change and said he was checked out.  He says that's not true and that he's more checked in but less driven to  Prove himself to others.  Much more at ease.  Less obsessing over emails.  Quicker with work.  Feels less pressure. Feels overall a lot better, much calmer, and even better than last time.  Drowsiness still annoying but manageable.  Benefit from Luvox added (on 100mg  for awhile), despite external stressors being tough.  Has seen some improvement is OCD.  Less rewriting.  Less anxiety around it. Had severe panic attack, brutal, unusual after 4 days of half dose Depakote. Plan: L-carnitine 1000 mg twice daily for Depakote related fogginess  For depression increase lamotrigine to 75 mg daily.  01/13/20 appt with following noted:  Started L-carnitine for energy and fogginess and not having that problem. Exercising losing weight, using  CPAP and sleeping better. Increased lamotrigine to 75 mg. Doing well overall except some breakthrough.  Last week 72 hours with amped up energy, irritability, FOI, hyperverbal and pressure and impulsivity and 2 weeks before that also.  Doesn't come down the same wahy as in the past.  Not as severe s in the the past. Not as anxious with the fluvoxamine.  Had some cycling prior to Luvox added.  Better insight into hypomania.   Patient reorts difficulty with anxiety but it's better.  OCD is better not gone.  Patient denies difficulty with sleep initiation or maintenance but vivid disturbing dreams. Denies appetite disturbance.  Patient reports that energy and motivation have been good.  Patient denies any difficulty with concentration other than distraction from OCD.  Patient denies any suicidal ideation.  Clear benefit from each of the meds added.  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  Wife complains that fluvoxamine seems to make him a little more curt and apt to be dismissive of disagreements.  Concerned about friend who needs to see me and is about to lose his wife and kids for variety of problems including alcohol and other problems.  Past Psychiatric Medication Trials:  VPA 100, Lamotrigine 75, fluvoxamine 100  Review of Systems:  Review of Systems  Constitutional: Positive for activity change.  Neurological: Negative for dizziness, tremors and weakness.  Psychiatric/Behavioral: Negative for agitation, behavioral problems,  confusion, decreased concentration, dysphoric mood, hallucinations, self-injury, sleep disturbance and suicidal ideas. The patient is not nervous/anxious and is not hyperactive.     Medications: I have reviewed the patient's current medications.  Current Outpatient Medications  Medication Sig Dispense Refill  . albuterol (VENTOLIN HFA) 108 (90 Base) MCG/ACT inhaler Inhale 2 puffs into the lungs every 6 (six) hours as needed for wheezing or  shortness of breath. 18 g 5  . allopurinol (ZYLOPRIM) 300 MG tablet Take 1 tablet (300 mg total) by mouth daily. 90 tablet 4  . colchicine 0.6 MG tablet Take 1 tablet (0.6 mg total) by mouth daily. May take an extra tab for flair 30 tablet 4  . divalproex (DEPAKOTE ER) 500 MG 24 hr tablet Take 3 tablets (1,500 mg total) by mouth at bedtime. 270 tablet 0  . fluvoxaMINE (LUVOX) 100 MG tablet Take 1 tablet (100 mg total) by mouth at bedtime. 90 tablet 1  . lamoTRIgine (LAMICTAL) 25 MG tablet Take 3 tablets (75 mg total) by mouth daily. 270 tablet 0  . lisinopril (ZESTRIL) 10 MG tablet Take 1 tablet (10 mg total) by mouth daily. 90 tablet 4  . loratadine (CLARITIN) 10 MG tablet Take 1 tablet (10 mg total) by mouth daily. 30 tablet 6  . Multiple Vitamin (MULTIVITAMIN) tablet Take 1 tablet by mouth daily.    Marland Kitchen omeprazole (PRILOSEC) 40 MG capsule TAKE 1 CAPSULE BY MOUTH EVERY DAY 90 capsule 2  . VITAMIN D, CHOLECALCIFEROL, PO Take 5,000 Units by mouth daily.      No current facility-administered medications for this visit.    Medication Side Effects: Sedation manageable. Vivid dreaming from lamotrigine is some better.  Wouldn't trade the gains for the drowsiness.  Allergies: No Known Allergies  Past Medical History:  Diagnosis Date  . Abnormal TSH   . Anxiety   . Bipolar disorder ([place])   . GERD (gastroesophageal reflux disease)   . History of mononucleosis   . Hyperglycemia   . Lymphocytosis   . Vitamin B 12 deficiency   . Vitamin D deficiency     Family History  Problem Relation Age of Onset  . Anxiety disorder Father   . COPD Father   . Other Sister        substance abuse  . Cancer Maternal Grandmother   . Hypertension Maternal Grandfather   . Cancer Paternal Grandfather     Social History   Socioeconomic History  . Marital status: Married    Spouse name: Not on file  . Number of children: Not on file  . Years of education: Not on file  . Highest education level: Not on  file  Occupational History  . Not on file  Tobacco Use  . Smoking status: Current Some Day Smoker    Types: Cigarettes    Start date: 57    Last attempt to quit: 12/19/2012    Years since quitting: 7.0  . Smokeless tobacco: Former Systems developer    Types: Chew    Quit date: 2010  . Tobacco comment: would smoke 2 packs every 3 weeks, currently 1 a week or so  Vaping Use  . Vaping Use: Every day  Substance and Sexual Activity  . Alcohol use: Yes    Alcohol/week: 6.0 standard drinks    Types: 6 Standard drinks or equivalent per week    Comment: weekly  . Drug use: No  . Sexual activity: Yes    Birth control/protection: None  Other Topics Concern  .  Not on file  Social History Narrative  . Not on file   Social Determinants of Health   Financial Resource Strain:   . Difficulty of Paying Living Expenses: Not on file  Food Insecurity:   . Worried About Charity fundraiser in the Last Year: Not on file  . Ran Out of Food in the Last Year: Not on file  Transportation Needs:   . Lack of Transportation (Medical): Not on file  . Lack of Transportation (Non-Medical): Not on file  Physical Activity:   . Days of Exercise per Week: Not on file  . Minutes of Exercise per Session: Not on file  Stress:   . Feeling of Stress : Not on file  Social Connections:   . Frequency of Communication with Friends and Family: Not on file  . Frequency of Social Gatherings with Friends and Family: Not on file  . Attends Religious Services: Not on file  . Active Member of Clubs or Organizations: Not on file  . Attends Archivist Meetings: Not on file  . Marital Status: Not on file  Intimate Partner Violence:   . Fear of Current or Ex-Partner: Not on file  . Emotionally Abused: Not on file  . Physically Abused: Not on file  . Sexually Abused: Not on file    Past Medical History, Surgical history, Social history, and Family history were reviewed and updated as appropriate.   Involved in  church.  Please see review of systems for further details on the patient's review from today.   Objective:   Physical Exam:  There were no vitals taken for this visit.  Physical Exam Constitutional:      General: He is not in acute distress.    Appearance: He is well-developed.  Musculoskeletal:        General: No deformity.  Neurological:     Mental Status: He is alert and oriented to person, place, and time.     Motor: No tremor.     Coordination: Coordination normal.     Gait: Gait normal.  Psychiatric:        Attention and Perception: He is attentive.        Mood and Affect: Mood is not anxious or depressed. Affect is not labile, blunt, angry or inappropriate.        Speech: Speech normal.        Behavior: Behavior normal.        Thought Content: Thought content normal. Thought content does not include homicidal or suicidal ideation. Thought content does not include homicidal or suicidal plan.        Cognition and Memory: Cognition normal.        Judgment: Judgment normal.     Comments: Insight intact. No auditory or visual hallucinations. OCD present but manageable. Some hypomania lately. Much calmer than he's ever been.     Lab Review:     Component Value Date/Time   NA 137 12/26/2019 0828   K 4.2 12/26/2019 0828   CL 98 12/26/2019 0828   CO2 23 12/26/2019 0828   GLUCOSE 117 (H) 12/26/2019 0828   BUN 17 12/26/2019 0828   CREATININE 1.07 12/26/2019 0828   CALCIUM 9.5 12/26/2019 0828   PROT 6.5 08/16/2016 0856   ALBUMIN 4.4 08/16/2016 0856   AST 20 08/16/2016 0856   ALT 25 08/16/2016 0856   ALKPHOS 51 08/16/2016 0856   BILITOT 0.4 08/16/2016 0856   GFRNONAA 85 12/26/2019 0828   GFRAA 98 12/26/2019  5726       Component Value Date/Time   WBC 9.0 08/16/2016 0856   RBC 5.40 08/16/2016 0856   HGB 15.7 08/16/2016 0856   HCT 45.6 08/16/2016 0856   PLT 220 08/16/2016 0856   MCV 84 08/16/2016 0856   MCH 29.1 08/16/2016 0856   MCHC 34.4 08/16/2016 0856    RDW 14.1 08/16/2016 0856   LYMPHSABS 3.6 (H) 08/16/2016 0856   EOSABS 0.4 08/16/2016 0856   BASOSABS 0.0 08/16/2016 0856    No results found for: POCLITH, LITHIUM   No results found for: PHENYTOIN, PHENOBARB, VALPROATE, CBMZ   Remote VPA 63 on 750mg /d Oct 2018 and LFT's stable.  .res Assessment: Plan:    Yuto was seen today for follow-up, manic behavior and anxiety.  Diagnoses and all orders for this visit:  Bipolar II disorder ([place]) -     divalproex (DEPAKOTE ER) 500 MG 24 hr tablet; Take 3 tablets (1,500 mg total) by mouth at bedtime. -     lamoTRIgine (LAMICTAL) 25 MG tablet; Take 3 tablets (75 mg total) by mouth daily.  Mixed obsessional thoughts and acts -     fluvoxaMINE (LUVOX) 100 MG tablet; Take 1 tablet (100 mg total) by mouth at bedtime. -     lamoTRIgine (LAMICTAL) 25 MG tablet; Take 3 tablets (75 mg total) by mouth daily.  Generalized anxiety disorder -     fluvoxaMINE (LUVOX) 100 MG tablet; Take 1 tablet (100 mg total) by mouth at bedtime.    Answered questions about trazodone for sleep and risk of adverse mood effects unlikely but possible.  Patient's rapid cycling bipolar disorder is less well controlled with the Depakote and needs a dosage increase bc interfering with work. Increase Depakote to 1500 mg HS.  Take L-carnitine 1000 mg twice daily for Depakote related fogginess  Option check ammonia level discussed  For depression continue lamotrigine to 75 mg daily.  OCD is much improved and further improved since last visit also though not resolved.  He is been on fluvoxamine 9 months or so.  Much calmer with Luvox and wife notices.  Feels much calmer than ever. Continue Luvox 100 DT  Benefit .  Does not appear to be causing cycling.  Call if there are worsening symptoms.  May have to explain to wife the reason for the emotional changes to which she needs to adjust.  Changes are healthier.  This appt was 30 mins.  Follow-up 2 mos  Rodney Parents MD,  DFAPA  Future Appointments  Date Time Provider [place]  06/25/2020  8:00 AM Park Liter P, DO [facility]    No orders of the defined types were placed in this encounter.     -------------------------------

## 2020-03-17 ENCOUNTER — Encounter: Payer: Self-pay | Admitting: Psychiatry

## 2020-03-17 ENCOUNTER — Ambulatory Visit (INDEPENDENT_AMBULATORY_CARE_PROVIDER_SITE_OTHER): Payer: 59 | Admitting: Psychiatry

## 2020-03-17 ENCOUNTER — Other Ambulatory Visit: Payer: Self-pay

## 2020-03-17 DIAGNOSIS — F3181 Bipolar II disorder: Secondary | ICD-10-CM | POA: Diagnosis not present

## 2020-03-17 DIAGNOSIS — F422 Mixed obsessional thoughts and acts: Secondary | ICD-10-CM

## 2020-03-17 DIAGNOSIS — G4733 Obstructive sleep apnea (adult) (pediatric): Secondary | ICD-10-CM | POA: Diagnosis not present

## 2020-03-17 DIAGNOSIS — F411 Generalized anxiety disorder: Secondary | ICD-10-CM | POA: Diagnosis not present

## 2020-03-17 DIAGNOSIS — Z9989 Dependence on other enabling machines and devices: Secondary | ICD-10-CM

## 2020-03-17 MED ORDER — FLUVOXAMINE MALEATE 100 MG PO TABS: 100.0000 mg | ORAL_TABLET | Freq: Every day | ORAL | 1 refills | Status: DC

## 2020-03-17 MED ORDER — LAMOTRIGINE 25 MG PO TABS: 75.0000 mg | ORAL_TABLET | Freq: Every day | ORAL | 1 refills | Status: DC

## 2020-03-17 MED ORDER — DIVALPROEX SODIUM ER 500 MG PO TB24: 1500.0000 mg | ORAL_TABLET | Freq: Every evening | ORAL | 1 refills | Status: DC

## 2020-03-17 NOTE — Progress Notes (Signed)
DOVBER SHALL BE:[id number] July 03, 1975 44 y.o.  Subjective:   Patient ID:  Rodney Peters is a 44 y.o. (DOB 05-Jan-1976) male.  Chief Complaint:  Chief Complaint  Patient presents with  . Follow-up  . Manic Behavior  . Depression   HPI Rodney Peters presents to the office today for follow-up of bipolar and anxiety.  visit was in August 2020 with no med changes.  He was doing reasonably well with  apid cycling bipolar disorder and OCD with Depakote and fluvoxamine.   04/2019 appt with following noted: r Been doing fantastic.  Really pleased with meds.  Stressful several months with Covid and I've been fine.  Managing well.  Dx severe OSA December an on  CPAP.  Remarkable.  Energy better.  Had dreaded going to bed  Before and now no problem.  More alert.  Less wakefulness.  Still some breakthrough depressive periods and more since last here.   Wife notices  And thinks that fluvoxamine has calmed him into being less bullying and less likely to argue or let the other person win an argument and wasn't that way in the past.   Wife a little nervous about the change and said he was checked out.  He says that's not true and that he's more checked in but less driven to  Prove himself to others.  Much more at ease.  Less obsessing over emails.  Quicker with work.  Feels less pressure. Feels overall a lot better, much calmer, and even better than last time.  Drowsiness still annoying but manageable.  Benefit from Luvox added (on 100mg  for awhile), despite external stressors being tough.  Has seen some improvement is OCD.  Less rewriting.  Less anxiety around it. Had severe panic attack, brutal, unusual after 4 days of half dose Depakote. Plan: L-carnitine 1000 mg twice daily for Depakote related fogginess  For depression increase lamotrigine to 75 mg daily.  01/13/20 appt with following noted:  Started L-carnitine for energy and fogginess and not having that problem. Exercising losing weight, using  CPAP and sleeping better. Increased lamotrigine to 75 mg. Doing well overall except some breakthrough.  Last week 72 hours with amped up energy, irritability, FOI, hyperverbal and pressure and impulsivity and 2 weeks before that also.  Doesn't come down the same wahy as in the past.  Not as severe s in the the past. Not as anxious with the fluvoxamine.  Had some cycling prior to Luvox added.  Better insight into hypomania. Plan: Patient's rapid cycling bipolar disorder is less well controlled with the Depakote and needs a dosage increase bc interfering with work. Increase Depakote to 1500 mg HS. Take L-carnitine 1000 mg twice daily for Depakote related fogginess  03/17/2020 appointment with the following noted: Doing very well.  Had cataclysmic month.  But he handled it well and wife and family agree.  Fogginess is better with L-carnitine.   Sleeping much better. Using CPAP. OSA was severe with AHI 60s. Better with sleep hygiene. I feel a lot better.   Patient reorts difficulty with anxiety but it's better.  OCD is better not gone.  Patient denies difficulty with sleep initiation or maintenance but vivid disturbing dreams. Denies appetite disturbance.  Patient reports that energy and motivation have been good.  Patient denies any difficulty with concentration other than distraction from OCD.  Patient denies any suicidal ideation.  Clear benefit from each of the meds added.  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  Wife complains that fluvoxamine seems to make him a little more curt and apt to be dismissive of disagreements. He's aware and trying to manage.  Not manic now.  Concerned about friend who needs to see me and is about to lose his wife and kids for variety of problems including alcohol and other problems.  Past Psychiatric Medication Trials:  VPA 100, Lamotrigine 75, fluvoxamine 100  Review of Systems:  Review of Systems  Constitutional: Negative for  activity change.  Neurological: Negative for dizziness, tremors and weakness.  Psychiatric/Behavioral: Negative for agitation, behavioral problems, confusion, decreased concentration, dysphoric mood, hallucinations, self-injury, sleep disturbance and suicidal ideas. The patient is not nervous/anxious and is not hyperactive.     Medications: I have reviewed the patient's current medications.  Current Outpatient Medications  Medication Sig Dispense Refill  . albuterol (VENTOLIN HFA) 108 (90 Base) MCG/ACT inhaler Inhale 2 puffs into the lungs every 6 (six) hours as needed for wheezing or shortness of breath. 18 g 5  . allopurinol (ZYLOPRIM) 300 MG tablet Take 1 tablet (300 mg total) by mouth daily. 90 tablet 4  . colchicine 0.6 MG tablet Take 1 tablet (0.6 mg total) by mouth daily. May take an extra tab for flair 30 tablet 4  . lisinopril (ZESTRIL) 10 MG tablet Take 1 tablet (10 mg total) by mouth daily. 90 tablet 4  . loratadine (CLARITIN) 10 MG tablet Take 1 tablet (10 mg total) by mouth daily. 30 tablet 6  . Multiple Vitamin (MULTIVITAMIN) tablet Take 1 tablet by mouth daily.    Marland Kitchen omeprazole (PRILOSEC) 40 MG capsule TAKE 1 CAPSULE BY MOUTH EVERY DAY 90 capsule 2  . VITAMIN D, CHOLECALCIFEROL, PO Take 5,000 Units by mouth daily.     . divalproex (DEPAKOTE ER) 500 MG 24 hr tablet Take 3 tablets (1,500 mg total) by mouth at bedtime. 270 tablet 1  . fluvoxaMINE (LUVOX) 100 MG tablet Take 1 tablet (100 mg total) by mouth at bedtime. 90 tablet 1  . lamoTRIgine (LAMICTAL) 25 MG tablet Take 3 tablets (75 mg total) by mouth daily. 270 tablet 1   No current facility-administered medications for this visit.    Medication Side Effects: Sedation manageable. Vivid dreaming from lamotrigine is some better.  Wouldn't trade the gains for the drowsiness.  Allergies: No Known Allergies  Past Medical History:  Diagnosis Date  . Abnormal TSH   . Anxiety   . Bipolar disorder ([place])   . GERD  (gastroesophageal reflux disease)   . History of mononucleosis   . Hyperglycemia   . Lymphocytosis   . Vitamin B 12 deficiency   . Vitamin D deficiency     Family History  Problem Relation Age of Onset  . Anxiety disorder Father   . COPD Father   . Other Sister        substance abuse  . Cancer Maternal Grandmother   . Hypertension Maternal Grandfather   . Cancer Paternal Grandfather     Social History   Socioeconomic History  . Marital status: Married    Spouse name: Not on file  . Number of children: Not on file  . Years of education: Not on file  . Highest education level: Not on file  Occupational History  . Not on file  Tobacco Use  . Smoking status: Current Some Day Smoker    Types: Cigarettes    Start date: 41    Last attempt to quit: 12/19/2012    Years  since quitting: 7.2  . Smokeless tobacco: Former Systems developer    Types: Chew    Quit date: 2010  . Tobacco comment: would smoke 2 packs every 3 weeks, currently 1 a week or so  Vaping Use  . Vaping Use: Every day  Substance and Sexual Activity  . Alcohol use: Yes    Alcohol/week: 6.0 standard drinks    Types: 6 Standard drinks or equivalent per week    Comment: weekly  . Drug use: No  . Sexual activity: Yes    Birth control/protection: None  Other Topics Concern  . Not on file  Social History Narrative  . Not on file   Social Determinants of Health   Financial Resource Strain: Not on file  Food Insecurity: Not on file  Transportation Needs: Not on file  Physical Activity: Not on file  Stress: Not on file  Social Connections: Not on file  Intimate Partner Violence: Not on file    Past Medical History, Surgical history, Social history, and Family history were reviewed and updated as appropriate.   Involved in church.  Please see review of systems for further details on the patient's review from today.   Objective:   Physical Exam:  There were no vitals taken for this visit.  Physical  Exam Constitutional:      General: He is not in acute distress.    Appearance: He is well-developed.  Musculoskeletal:        General: No deformity.  Neurological:     Mental Status: He is alert and oriented to person, place, and time.     Motor: No tremor.     Coordination: Coordination normal.     Gait: Gait normal.  Psychiatric:        Attention and Perception: He is attentive.        Mood and Affect: Mood is anxious. Mood is not depressed. Affect is not labile, blunt, angry or inappropriate.        Speech: Speech normal.        Behavior: Behavior normal.        Thought Content: Thought content normal. Thought content does not include homicidal or suicidal ideation. Thought content does not include homicidal or suicidal plan.        Cognition and Memory: Cognition normal.        Judgment: Judgment normal.     Comments: Insight intact. No auditory or visual hallucinations. OCD present but manageable.  Much calmer than he's ever been.     Lab Review:     Component Value Date/Time   NA 137 12/26/2019 0828   K 4.2 12/26/2019 0828   CL 98 12/26/2019 0828   CO2 23 12/26/2019 0828   GLUCOSE 117 (H) 12/26/2019 0828   BUN 17 12/26/2019 0828   CREATININE 1.07 12/26/2019 0828   CALCIUM 9.5 12/26/2019 0828   PROT 6.5 08/16/2016 0856   ALBUMIN 4.4 08/16/2016 0856   AST 20 08/16/2016 0856   ALT 25 08/16/2016 0856   ALKPHOS 51 08/16/2016 0856   BILITOT 0.4 08/16/2016 0856   GFRNONAA 85 12/26/2019 0828   GFRAA 98 12/26/2019 0828       Component Value Date/Time   WBC 9.0 08/16/2016 0856   RBC 5.40 08/16/2016 0856   HGB 15.7 08/16/2016 0856   HCT 45.6 08/16/2016 0856   PLT 220 08/16/2016 0856   MCV 84 08/16/2016 0856   MCH 29.1 08/16/2016 0856   MCHC 34.4 08/16/2016 0856   RDW 14.1 08/16/2016  0856   LYMPHSABS 3.6 (H) 08/16/2016 0856   EOSABS 0.4 08/16/2016 0856   BASOSABS 0.0 08/16/2016 0856    No results found for: POCLITH, LITHIUM   No results found for: PHENYTOIN,  PHENOBARB, VALPROATE, CBMZ   Remote VPA 63 on 750mg /d Oct 2018 and LFT's stable.  .res Assessment: Plan:    Azari was seen today for follow-up, manic behavior and depression.  Diagnoses and all orders for this visit:  Bipolar II disorder ([facility]) -     divalproex (DEPAKOTE ER) 500 MG 24 hr tablet; Take 3 tablets (1,500 mg total) by mouth at bedtime. -     lamoTRIgine (LAMICTAL) 25 MG tablet; Take 3 tablets (75 mg total) by mouth daily.  Mixed obsessional thoughts and acts -     fluvoxaMINE (LUVOX) 100 MG tablet; Take 1 tablet (100 mg total) by mouth at bedtime. -     lamoTRIgine (LAMICTAL) 25 MG tablet; Take 3 tablets (75 mg total) by mouth daily.  Generalized anxiety disorder -     fluvoxaMINE (LUVOX) 100 MG tablet; Take 1 tablet (100 mg total) by mouth at bedtime.  Obstructive sleep apnea treated with continuous positive airway pressure (CPAP)    Answered questions about trazodone for sleep and risk of adverse mood effects unlikely but possible.  Patient's rapid cycling bipolar disorder is less well controlled with the Depakote and needs a dosage increase bc interfering with work. Continue Depakote to 1500 mg HS.  Rec he continue L-carnitine 1000 mg twice daily for Depakote related fogginess .  But he's thinking it might be OSA. Option check ammonia level discussed  For depression continue lamotrigine to 75 mg daily.  OCD is much improved and further improved since last visit also though not resolved.  He is been on fluvoxamine 9 months or so.  Much calmer with Luvox and wife notices.  Feels much calmer than ever. Continue Luvox 100 DT  Benefit .  Does not appear to be causing cycling. Thinks he has social anxiety and avoidance that does affect life.  Prefers to be at home.  Direct with people and this can be a problem.  Consistency with CPAP important for mental health.  Call if there are worsening symptoms.  May have to explain to wife the reason for the emotional  changes to which she needs to adjust.  Changes are healthier.  This appt was 30 mins.  Follow-up 3-4 mos  Riki Rusk MD, DFAPA  Future Appointments  Date Time Provider Department [facility]  06/25/2020  8:00 AM 08/25/2020 P, DO [facility]    No orders of the defined types were placed in this encounter.     -------------------------------

## 2020-06-15 ENCOUNTER — Other Ambulatory Visit: Payer: Self-pay

## 2020-06-15 ENCOUNTER — Ambulatory Visit: Payer: 59 | Admitting: Nurse Practitioner

## 2020-06-15 ENCOUNTER — Encounter: Payer: Self-pay | Admitting: Nurse Practitioner

## 2020-06-15 VITALS — BP 139/84 | HR 82 | Temp 98.5°F | Wt 269.6 lb

## 2020-06-15 DIAGNOSIS — E782 Mixed hyperlipidemia: Secondary | ICD-10-CM | POA: Diagnosis not present

## 2020-06-15 DIAGNOSIS — I499 Cardiac arrhythmia, unspecified: Secondary | ICD-10-CM

## 2020-06-15 DIAGNOSIS — R079 Chest pain, unspecified: Secondary | ICD-10-CM | POA: Diagnosis not present

## 2020-06-15 DIAGNOSIS — F17210 Nicotine dependence, cigarettes, uncomplicated: Secondary | ICD-10-CM

## 2020-06-15 DIAGNOSIS — I10 Essential (primary) hypertension: Secondary | ICD-10-CM | POA: Diagnosis not present

## 2020-06-15 NOTE — Assessment & Plan Note (Signed)
Discussed complete tobacco cessation. He is not interested at this time. States he only smokes a few cigarettes a week.

## 2020-06-15 NOTE — Assessment & Plan Note (Signed)
Chronic. ASCVD score is 25.6% which was discussed with patient. Recommended that he starts a statin medication, however he wants to focus on diet and exercise right now and doesn't want to start cholesterol medication. Will check lipid panel today.

## 2020-06-15 NOTE — Progress Notes (Signed)
Acute Office Visit  Subjective:    Patient ID: Rodney Peters, male    DOB: 12-25-1975, 45 y.o.   MRN: [id number]  Chief Complaint  Patient presents with  . Pain    Pt states "iv'e had the things and feelings that people have before they have a heart attack". States he is not in any pain today and that this has been going on for a while.     HPI Patient is in today for chest pain/palpitations. He felt fluttering in his chest on 2 occasions a month ago while lying in bed and now he has intermittent chest pressure that radiates up his neck and down his left arm 3-4 times a week. He almost called 911 at one point, but said the pain only lasts a few seconds and then goes away.   CHEST PAIN  Time since onset: Duration:months--9monthOnset: sudden Quality: pressure-like and pulling Severity: moderate Location: upper left Radiation: left arm Episode duration:  5-15 seconds Frequency: a few times a week Related to exertion: no Activity when pain started: happened when driving and when laying in bed Trauma: no Anxiety/recent stressors: no Aggravating factors: none Alleviating factors: none Status: stable Treatments attempted: nothing  Current pain status: pain free Shortness of breath: no Cough: no Nausea: no Diaphoresis: no Heartburn: yes Palpitations: yes  Past Medical History:  Diagnosis Date  . Abnormal TSH   . Anxiety   [place]   . GERD (gastroesophageal reflux disease)   . History of mononucleosis   . Hyperglycemia   . Lymphocytosis   . Vitamin B 12 deficiency   . Vitamin D deficiency     Past Surgical History:  Procedure Laterality Date  . VASECTOMY      Family History  Problem Relation Age of Onset  . Anxiety disorder Father   . COPD Father   . Other Sister        substance abuse  . Cancer Maternal Grandmother   . Hypertension Maternal Grandfather   . Cancer Paternal Grandfather     Social History   Socioeconomic History  .  Marital status: Married    Spouse name: Not on file  . Number of children: Not on file  . Years of education: Not on file  . Highest education level: Not on file  Occupational History  . Not on file  Tobacco Use  . Smoking status: Current Some Day Smoker    Types: Cigarettes    Start date: 167   Last attempt to quit: 12/19/2012    Years since quitting: 7.4  . Smokeless tobacco: Former [employer] developer   Types: Chew    Quit date: 2010  . Tobacco comment: would smoke 2 packs every 3 weeks, currently 1 a week or so  Vaping Use  . Vaping Use: Former  Substance and Sexual Activity  . Alcohol use: Yes    Alcohol/week: 6.0 standard drinks    Types: 6 Standard drinks or equivalent per week    Comment: weekly  . Drug use: No  . Sexual activity: Yes    Birth control/protection: None  Other Topics Concern  . Not on file  Social History Narrative  . Not on file   Social Determinants of Health   Financial Resource Strain: Not on file  Food Insecurity: Not on file  Transportation Needs: Not on file  Physical Activity: Not on file  Stress: Not on file  Social Connections: Not on file  Intimate Partner  Violence: Not on file    Outpatient Medications Prior to Visit  Medication Sig Dispense Refill  . albuterol (VENTOLIN HFA) 108 (90 Base) MCG/ACT inhaler Inhale 2 puffs into the lungs every 6 (six) hours as needed for wheezing or shortness of breath. 18 g 5  . allopurinol (ZYLOPRIM) 300 MG tablet Take 1 tablet (300 mg total) by mouth daily. 90 tablet 4  . colchicine 0.6 MG tablet Take 1 tablet (0.6 mg total) by mouth daily. May take an extra tab for flair 30 tablet 4  . divalproex (DEPAKOTE ER) 500 MG 24 hr tablet Take 3 tablets (1,500 mg total) by mouth at bedtime. 270 tablet 1  . fluvoxaMINE (LUVOX) 100 MG tablet Take 1 tablet (100 mg total) by mouth at bedtime. 90 tablet 1  . lamoTRIgine (LAMICTAL) 25 MG tablet Take 3 tablets (75 mg total) by mouth daily. 270 tablet 1  . lisinopril (ZESTRIL)  10 MG tablet Take 1 tablet (10 mg total) by mouth daily. 90 tablet 4  . Multiple Vitamin (MULTIVITAMIN) tablet Take 1 tablet by mouth daily.    Marland Kitchen omeprazole (PRILOSEC) 40 MG capsule TAKE 1 CAPSULE BY MOUTH EVERY DAY 90 capsule 2  . VITAMIN D, CHOLECALCIFEROL, PO Take 5,000 Units by mouth daily.     Marland Kitchen loratadine (CLARITIN) 10 MG tablet Take 1 tablet (10 mg total) by mouth daily. (Patient not taking: Reported on 06/15/2020) 30 tablet 6   No facility-administered medications prior to visit.    No Known Allergies  Review of Systems  Constitutional: Positive for fatigue.  HENT: Negative.   Eyes: Negative.   Respiratory: Positive for cough (in the mornings).   Cardiovascular: Positive for chest pain (see hpi) and palpitations.  Gastrointestinal: Negative.   Genitourinary: Negative.   Musculoskeletal: Negative.   Skin: Negative.   Neurological: Positive for numbness (and tingling in fingers not related to chest pain).  Psychiatric/Behavioral: Negative.        Objective:    Physical Exam Vitals and nursing note reviewed.  Constitutional:      Appearance: Normal appearance.  HENT:     Head: Normocephalic.  Eyes:     Conjunctiva/sclera: Conjunctivae normal.  Neck:     Vascular: No carotid bruit.  Cardiovascular:     Rate and Rhythm: Normal rate and regular rhythm.     Pulses: Normal pulses.     Heart sounds: Normal heart sounds.  Pulmonary:     Effort: Pulmonary effort is normal.     Breath sounds: Normal breath sounds.  Musculoskeletal:     Cervical back: Normal range of motion.     Right lower leg: No edema.     Left lower leg: No edema.  Skin:    General: Skin is warm.  Neurological:     General: No focal deficit present.     Mental Status: He is alert and oriented to person, place, and time.  Psychiatric:        Mood and Affect: Mood normal.        Behavior: Behavior normal.        Thought Content: Thought content normal.        Judgment: Judgment normal.        BP 139/84 (BP Location: Right Arm, Cuff Size: Normal)   Pulse 82   Temp 98.5 F (36.9 C) (Oral)   Wt 269 lb 9.6 oz (122.3 kg)   SpO2 96%   BMI 38.68 kg/m  Wt Readings from Last 3 Encounters:  06/15/20  269 lb 9.6 oz (122.3 kg)  12/26/19 269 lb 12.8 oz (122.4 kg)  12/19/18 265 lb (120.2 kg)   The 10-year ASCVD risk score Mikey Bussing DC Jr., et al., 2013) is: 25.6%   Values used to calculate the score:     Age: 36 years     Sex: Male     Is Non-Hispanic African American: No     Diabetic: No     Tobacco smoker: Yes     Systolic Blood Pressure: 671 mmHg     Is BP treated: Yes     HDL Cholesterol: 28 mg/dL     Total Cholesterol: 294 mg/dL   Health Maintenance Due  Topic Date Due  . Hepatitis C Screening  Never done  . HIV Screening  Never done    There are no preventive care reminders to display for this patient.   Lab Results  Component Value Date   TSH 1.550 12/26/2019   Lab Results  Component Value Date   WBC 9.0 08/16/2016   HGB 15.7 08/16/2016   HCT 45.6 08/16/2016   MCV 84 08/16/2016   PLT 220 08/16/2016   Lab Results  Component Value Date   NA 137 12/26/2019   K 4.2 12/26/2019   CO2 23 12/26/2019   GLUCOSE 117 (H) 12/26/2019   BUN 17 12/26/2019   CREATININE 1.07 12/26/2019   BILITOT 0.4 08/16/2016   ALKPHOS 51 08/16/2016   AST 20 08/16/2016   ALT 25 08/16/2016   PROT 6.5 08/16/2016   ALBUMIN 4.4 08/16/2016   CALCIUM 9.5 12/26/2019   Lab Results  Component Value Date   CHOL 294 (H) 12/26/2019   Lab Results  Component Value Date   HDL 28 (L) 12/26/2019   Lab Results  Component Value Date   LDLCALC Comment (A) 12/26/2019   Lab Results  Component Value Date   TRIG 948 (HH) 12/26/2019   Lab Results  Component Value Date   CHOLHDL 7.4 (H) 02/13/2018   No results found for: HGBA1C     Assessment & Plan:   Problem List Items Addressed This Visit      Cardiovascular and Mediastinum   Essential hypertension    Chronic, stable. Check  CMP, CBC today. Continue current regimen.       Relevant Orders   Comp Met (CMET)   CBC with Differential     Other   Elevated triglycerides with high cholesterol    Chronic. ASCVD score is 25.6% which was discussed with patient. Recommended that he starts a statin medication, however he wants to focus on diet and exercise right now and doesn't want to start cholesterol medication. Will check lipid panel today.      Relevant Orders   Lipid Panel w/o Chol/HDL Ratio   Nicotine dependence, cigarettes, uncomplicated    Discussed complete tobacco cessation. He is not interested at this time. States he only smokes a few cigarettes a week.      Chest pain - Primary    Intermittent, lasting between 5-30 seconds. Feels like pressure and squeezing in his chest. Not related to exertion. EKG obtained and shows NSR. Vital signs are stable. Currently chest pain free. Will refer to cardiology. ER precautions given. Follow-up in 1 month.        Other Visit Diagnoses    Irregular heartbeat       Had fluttering in his chest x2 one month ago. EKG showed NSR. Will refer to cardiology   Relevant Orders   EKG 12-Lead (  Completed)   Ambulatory referral to Cardiology       No orders of the defined types were placed in this encounter.    Charyl Dancer, NP

## 2020-06-15 NOTE — Patient Instructions (Signed)
High Cholesterol  High cholesterol is a condition in which the blood has high levels of a white, waxy substance similar to fat (cholesterol). The liver makes all the cholesterol that the body needs. The human body needs small amounts of cholesterol to help build cells. A person gets extra or excess cholesterol from the food that he or she eats. The blood carries cholesterol from the liver to the rest of the body. If you have high cholesterol, deposits (plaques) may build up on the walls of your arteries. Arteries are the blood vessels that carry blood away from your heart. These plaques make the arteries narrow and stiff. Cholesterol plaques increase your risk for heart attack and stroke. Work with your health care provider to keep your cholesterol levels in a healthy range. What increases the risk? The following factors may make you more likely to develop this condition:  Eating foods that are high in animal fat (saturated fat) or cholesterol.  Being overweight.  Not getting enough exercise.  A family history of high cholesterol (familial hypercholesterolemia).  Use of tobacco products.  Having diabetes. What are the signs or symptoms? There are no symptoms of this condition. How is this diagnosed? This condition may be diagnosed based on the results of a blood test.  If you are older than 44 years of age, your health care provider may check your cholesterol levels every 4-6 years.  You may be checked more often if you have high cholesterol or other risk factors for heart disease. The blood test for cholesterol measures:  "Bad" cholesterol, or LDL cholesterol. This is the main type of cholesterol that causes heart disease. The desired level is less than 100 mg/dL.  "Good" cholesterol, or HDL cholesterol. HDL helps protect against heart disease by cleaning the arteries and carrying the LDL to the liver for processing. The desired level for HDL is 60 mg/dL or higher.  Triglycerides.  These are fats that your body can store or burn for energy. The desired level is less than 150 mg/dL.  Total cholesterol. This measures the total amount of cholesterol in your blood and includes LDL, HDL, and triglycerides. The desired level is less than 200 mg/dL. How is this treated? This condition may be treated with:  Diet changes. You may be asked to eat foods that have more fiber and less saturated fats or added sugar.  Lifestyle changes. These may include regular exercise, maintaining a healthy weight, and quitting use of tobacco products.  Medicines. These are given when diet and lifestyle changes have not worked. You may be prescribed a statin medicine to help lower your cholesterol levels. Follow these instructions at home: Eating and drinking  Eat a healthy, balanced diet. This diet includes: ? Daily servings of a variety of fresh, frozen, or canned fruits and vegetables. ? Daily servings of whole grain foods that are rich in fiber. ? Foods that are low in saturated fats and trans fats. These include poultry and fish without skin, lean cuts of meat, and low-fat dairy products. ? A variety of fish, especially oily fish that contain omega-3 fatty acids. Aim to eat fish at least 2 times a week.  Avoid foods and drinks that have added sugar.  Use healthy cooking methods, such as roasting, grilling, broiling, baking, poaching, steaming, and stir-frying. Do not fry your food except for stir-frying.   Lifestyle  Get regular exercise. Aim to exercise for a total of 150 minutes a week. Increase your activity level by doing activities   such as gardening, walking, and taking the stairs.  Do not use any products that contain nicotine or tobacco, such as cigarettes, e-cigarettes, and chewing tobacco. If you need help quitting, ask your health care provider.   General instructions  Take over-the-counter and prescription medicines only as told by your health care provider.  Keep all  follow-up visits as told by your health care provider. This is important. Where to find more information  American Heart Association: www.heart.org  National Heart, Lung, and Blood Institute: www.nhlbi.nih.gov Contact a health care provider if:  You have trouble achieving or maintaining a healthy diet or weight.  You are starting an exercise program.  You are unable to stop smoking. Get help right away if:  You have chest pain.  You have trouble breathing.  You have any symptoms of a stroke. "BE FAST" is an easy way to remember the main warning signs of a stroke: ? B - Balance. Signs are dizziness, sudden trouble walking, or loss of balance. ? E - Eyes. Signs are trouble seeing or a sudden change in vision. ? F - Face. Signs are sudden weakness or numbness of the face, or the face or eyelid drooping on one side. ? A - Arms. Signs are weakness or numbness in an arm. This happens suddenly and usually on one side of the body. ? S - Speech. Signs are sudden trouble speaking, slurred speech, or trouble understanding what people say. ? T - Time. Time to call emergency services. Write down what time symptoms started.  You have other signs of a stroke, such as: ? A sudden, severe headache with no known cause. ? Nausea or vomiting. ? Seizure. These symptoms may represent a serious problem that is an emergency. Do not wait to see if the symptoms will go away. Get medical help right away. Call your local emergency services (911 in the [place] Do not drive yourself to the hospital. Summary  Cholesterol plaques increase your risk for heart attack and stroke. Work with your health care provider to keep your cholesterol levels in a healthy range.  Eat a healthy, balanced diet, get regular exercise, and maintain a healthy weight.  Do not use any products that contain nicotine or tobacco, such as cigarettes, e-cigarettes, and chewing tobacco.  Get help right away if you have any symptoms of a  stroke. This information is not intended to replace advice given to you by your health care provider. Make sure you discuss any questions you have with your health care provider. Document Revised: 02/04/2019 Document Reviewed: 02/04/2019 Elsevier Patient Education  2021 Elsevier Inc.  

## 2020-06-15 NOTE — Assessment & Plan Note (Signed)
Intermittent, lasting between 5-30 seconds. Feels like pressure and squeezing in his chest. Not related to exertion. EKG obtained and shows NSR. Vital signs are stable. Currently chest pain free. Will refer to cardiology. ER precautions given. Follow-up in 1 month.

## 2020-06-15 NOTE — Assessment & Plan Note (Signed)
Chronic, stable. Check CMP, CBC today. Continue current regimen.

## 2020-06-15 NOTE — Progress Notes (Signed)
EKG done on 06/15/20 interpreted by me shows normal sinus rhythm with a heart rate of 79. No ST or T wave changes.

## 2020-06-16 LAB — LIPID PANEL W/O CHOL/HDL RATIO
Cholesterol, Total: 295 mg/dL — ABNORMAL HIGH (ref 100–199)
HDL: 29 mg/dL — ABNORMAL LOW (ref >39)
LDL Chol Calc (NIH): 128 mg/dL — ABNORMAL HIGH (ref 0–99)
Triglycerides: 737 mg/dL (ref 0–149)
VLDL Cholesterol Cal: 138 mg/dL — ABNORMAL HIGH (ref 5–40)

## 2020-06-16 LAB — CBC WITH DIFFERENTIAL/PLATELET
Basophils Absolute: 0.1 10*3/uL (ref 0.0–0.2)
Basos: 1 %
EOS (ABSOLUTE): 0.5 10*3/uL — ABNORMAL HIGH (ref 0.0–0.4)
Eos: 4 %
Hematocrit: 49.3 % (ref 37.5–51.0)
Hemoglobin: 17.3 g/dL (ref 13.0–17.7)
Immature Grans (Abs): 0 10*3/uL (ref 0.0–0.1)
Immature Granulocytes: 0 %
Lymphocytes Absolute: 5.6 10*3/uL — ABNORMAL HIGH (ref 0.7–3.1)
Lymphs: 49 %
MCH: 29.9 pg (ref 26.6–33.0)
MCHC: 35.1 g/dL (ref 31.5–35.7)
MCV: 85 fL (ref 79–97)
Monocytes Absolute: 0.9 10*3/uL (ref 0.1–0.9)
Monocytes: 8 %
Neutrophils Absolute: 4.4 10*3/uL (ref 1.4–7.0)
Neutrophils: 38 %
Platelets: 201 10*3/uL (ref 150–450)
RBC: 5.79 x10E6/uL (ref 4.14–5.80)
RDW: 13.2 % (ref 11.6–15.4)
WBC: 11.5 10*3/uL — ABNORMAL HIGH (ref 3.4–10.8)

## 2020-06-16 LAB — COMPREHENSIVE METABOLIC PANEL
ALT: 24 IU/L (ref 0–44)
AST: 24 IU/L (ref 0–40)
Albumin/Globulin Ratio: 2.1 (ref 1.2–2.2)
Albumin: 5 g/dL (ref 4.0–5.0)
Alkaline Phosphatase: 58 IU/L (ref 44–121)
BUN/Creatinine Ratio: 20 (ref 9–20)
BUN: 20 mg/dL (ref 6–24)
Bilirubin Total: 0.2 mg/dL (ref 0.0–1.2)
CO2: 20 mmol/L (ref 20–29)
Calcium: 10.2 mg/dL (ref 8.7–10.2)
Chloride: 98 mmol/L (ref 96–106)
Creatinine, Ser: 0.98 mg/dL (ref 0.76–1.27)
Globulin, Total: 2.4 g/dL (ref 1.5–4.5)
Glucose: 82 mg/dL (ref 65–99)
Potassium: 4.3 mmol/L (ref 3.5–5.2)
Sodium: 139 mmol/L (ref 134–144)
Total Protein: 7.4 g/dL (ref 6.0–8.5)
eGFR: 98 mL/min/{1.73_m2} (ref >59)

## 2020-06-25 ENCOUNTER — Encounter: Payer: 59 | Admitting: Family Medicine

## 2020-07-14 ENCOUNTER — Ambulatory Visit: Payer: 59 | Admitting: Internal Medicine

## 2020-07-16 ENCOUNTER — Ambulatory Visit: Payer: 59 | Admitting: Psychiatry

## 2020-07-23 ENCOUNTER — Ambulatory Visit (INDEPENDENT_AMBULATORY_CARE_PROVIDER_SITE_OTHER): Payer: 59 | Admitting: Cardiology

## 2020-07-23 ENCOUNTER — Other Ambulatory Visit: Payer: Self-pay

## 2020-07-23 ENCOUNTER — Encounter: Payer: Self-pay | Admitting: Cardiology

## 2020-07-23 VITALS — BP 122/80 | HR 79 | Ht 70.0 in | Wt 268.0 lb

## 2020-07-23 DIAGNOSIS — I499 Cardiac arrhythmia, unspecified: Secondary | ICD-10-CM | POA: Diagnosis not present

## 2020-07-23 DIAGNOSIS — F17200 Nicotine dependence, unspecified, uncomplicated: Secondary | ICD-10-CM | POA: Diagnosis not present

## 2020-07-23 DIAGNOSIS — I10 Essential (primary) hypertension: Secondary | ICD-10-CM | POA: Diagnosis not present

## 2020-07-23 DIAGNOSIS — E7800 Pure hypercholesterolemia, unspecified: Secondary | ICD-10-CM

## 2020-07-23 MED ORDER — ATORVASTATIN CALCIUM 40 MG PO TABS: 40.0000 mg | ORAL_TABLET | Freq: Every day | ORAL | 5 refills | Status: DC

## 2020-07-23 NOTE — Patient Instructions (Addendum)
Medication Instructions:  Your physician recommends that you continue on your current medications as directed. Please refer to the Current Medication list given to you today.  *If you need a refill on your cardiac medications before your next appointment, please call your pharmacy*   Lab Work:  Your physician recommends that you return for a FASTING lipid profile: in 3 months  - You will need to be fasting. Please do not have anything to eat or drink after midnight the morning you have the lab work. You may only have water or black coffee with no cream or sugar. - Please go to the [facility]. You will check in at the front desk to the right as you walk into the atrium. Valet Parking is offered if needed. - No appointment needed. You may go any day between 7 am and 6 pm.   Testing/Procedures: None ordered   Follow-Up: At [facility], you and your health needs are our priority.  As part of our continuing mission to provide you with exceptional heart care, we have created designated Provider Care Teams.  These Care Teams include your primary Cardiologist (physician) and Advanced Practice Providers (APPs -  Physician Assistants and Nurse Practitioners) who all work together to provide you with the care you need, when you need it.  We recommend signing up for the patient portal called "MyChart".  Sign up information is provided on this After Visit Summary.  MyChart is used to connect with patients for Virtual Visits (Telemedicine).  Patients are able to view lab/test results, encounter notes, upcoming appointments, etc.  Non-urgent messages can be sent to your provider as well.   To learn more about what you can do with MyChart, go to [email or web address].    Your next appointment:   6 month(s)  The format for your next appointment:   In Person  Provider:   Brian Agbor-Etang, MD   Other Instructions    

## 2020-07-23 NOTE — Progress Notes (Signed)
Cardiology Office Note:    Date:  07/23/2020   ID:  Rodney Peters, DOB 1975-09-09, MRN [id number]  PCP:  Rodney Cousins, MD   [facility] Providers Cardiologist:  Kate Sable, MD     Referring MD: Rodney Dancer, NP   Chief Complaint  Patient presents with  . New Patient (Initial Visit)    Referred by PCP for irregular Heartbeat. Meds reviewed verbally with patient.    Rodney Peters is a 45 y.o. male who is being seen today for the evaluation of irregular heartbeats at the request of McElwee, Lauren A, NP.   History of Present Illness:    Rodney Peters is a 45 y.o. male with a hx of hypertension, anxiety, GERD who presents due to irregular heartbeats and to establish care.  Patient had some episodes of heart fluttering couple of months ago lasting a couple of minutes.  Also had some arm numbness, attributed to carpal tunnel syndrome.  Denied dizziness, presyncope or syncope.  His symptoms have largely resolved.  Able to perform all activities of daily living without any adverse effects or symptoms such as chest pain, shortness of breath.  Denies edema.  Takes lisinopril for blood pressure, BP is well controlled.  He will like to be established with cardiology in order to manage chronic conditions.  He has no acute cardiac abnormality.  He occasionally smokes cigarettes.  Past Medical History:  Diagnosis Date  . Abnormal TSH   . Anxiety   . Bipolar disorder ([place])   . GERD (gastroesophageal reflux disease)   . History of mononucleosis   . Hyperglycemia   . Lymphocytosis   . Vitamin B 12 deficiency   . Vitamin D deficiency     Past Surgical History:  Procedure Laterality Date  . VASECTOMY      Current Medications: Current Meds  Medication Sig  . albuterol (VENTOLIN HFA) 108 (90 Base) MCG/ACT inhaler Inhale 2 puffs into the lungs every 6 (six) hours as needed for wheezing or shortness of breath.  . allopurinol (ZYLOPRIM) 300 MG tablet Take 1 tablet (300 mg  total) by mouth daily.  Marland Kitchen atorvastatin (LIPITOR) 40 MG tablet Take 1 tablet (40 mg total) by mouth daily.  . colchicine 0.6 MG tablet Take 1 tablet (0.6 mg total) by mouth daily. May take an extra tab for flair  . divalproex (DEPAKOTE ER) 500 MG 24 hr tablet Take 3 tablets (1,500 mg total) by mouth at bedtime.  . fluvoxaMINE (LUVOX) 100 MG tablet Take 1 tablet (100 mg total) by mouth at bedtime.  . lamoTRIgine (LAMICTAL) 25 MG tablet Take 3 tablets (75 mg total) by mouth daily.  Marland Kitchen lisinopril (ZESTRIL) 10 MG tablet Take 1 tablet (10 mg total) by mouth daily.  Marland Kitchen loratadine (CLARITIN) 10 MG tablet Take 1 tablet (10 mg total) by mouth daily.  . Multiple Vitamin (MULTIVITAMIN) tablet Take 1 tablet by mouth daily.  Marland Kitchen omeprazole (PRILOSEC) 40 MG capsule TAKE 1 CAPSULE BY MOUTH EVERY DAY  . VITAMIN D, CHOLECALCIFEROL, PO Take 5,000 Units by mouth daily.      Allergies:   Patient has no known allergies.   Social History   Socioeconomic History  . Marital status: Married    Spouse name: Not on file  . Number of children: Not on file  . Years of education: Not on file  . Highest education level: Not on file  Occupational History  . Not on file  Tobacco Use  . Smoking status:  Current Some Day Smoker    Types: Cigarettes    Start date: 41    Last attempt to quit: 12/19/2012    Years since quitting: 7.5  . Smokeless tobacco: Former Systems developer    Types: Chew    Quit date: 2010  . Tobacco comment: would smoke 2 packs every 3 weeks, currently 1 a week or so  Vaping Use  . Vaping Use: Former  Substance and Sexual Activity  . Alcohol use: Yes    Alcohol/week: 6.0 standard drinks    Types: 6 Standard drinks or equivalent per week    Comment: weekly  . Drug use: No  . Sexual activity: Yes    Birth control/protection: None  Other Topics Concern  . Not on file  Social History Narrative  . Not on file   Social Determinants of Health   Financial Resource Strain: Not on file  Food Insecurity:  Not on file  Transportation Needs: Not on file  Physical Activity: Not on file  Stress: Not on file  Social Connections: Not on file     Family History: The patient's family history includes Anxiety disorder in his father; COPD in his father; Cancer in his maternal grandmother and paternal grandfather; Hypertension in his maternal grandfather; Other in his sister.  ROS:   Please see the history of present illness.     All other systems reviewed and are negative.  EKGs/Labs/Other Studies Reviewed:    The following studies were reviewed today:   EKG:  EKG is  ordered today.  The ekg ordered today demonstrates normal sinus rhythm, normal ECG  Recent Labs: 12/26/2019: TSH 1.550 06/15/2020: ALT 24; BUN 20; Creatinine, Ser 0.98; Hemoglobin 17.3; Platelets 201; Potassium 4.3; Sodium 139  Recent Lipid Panel    Component Value Date/Time   CHOL 295 (H) 06/15/2020 1501   TRIG 737 (HH) 06/15/2020 1501   HDL 29 (L) 06/15/2020 1501   CHOLHDL 7.4 (H) 02/13/2018 0949   LDLCALC 128 (H) 06/15/2020 1501     Risk Assessment/Calculations:      Physical Exam:    VS:  BP 122/80 (BP Location: Left Arm, Patient Position: Sitting, Cuff Size: Normal)   Pulse 79   Ht 5\' 10"  (1.778 m)   Wt 268 lb (121.6 kg)   SpO2 95%   BMI 38.45 kg/m     Wt Readings from Last 3 Encounters:  07/23/20 268 lb (121.6 kg)  06/15/20 269 lb 9.6 oz (122.3 kg)  12/26/19 269 lb 12.8 oz (122.4 kg)     GEN:  Well nourished, well developed in no acute distress HEENT: Normal NECK: No JVD; No carotid bruits LYMPHATICS: No lymphadenopathy CARDIAC: RRR, no murmurs, rubs, gallops RESPIRATORY:  Clear to auscultation without rales, wheezing or rhonchi  ABDOMEN: Soft, non-tender, non-distended MUSCULOSKELETAL:  No edema; No deformity  SKIN: Warm and dry NEUROLOGIC:  Alert and oriented x 3 PSYCHIATRIC:  Normal affect   ASSESSMENT:    1. Irregular heart beats   2. Pure hypercholesterolemia   3. Primary hypertension    4. Smoking    PLAN:    In order of problems listed above:  1. Irregular heartbeats, resolved, currently asymptomatic.  Continue to monitor.  If symptoms return, will consider cardiac monitor. 2. Hyperlipidemia, 10-year ASCVD 20.2.  Statin recommended, but patient would like to manage cholesterol levels with diet first.  Repeat fasting lipid profile in 3 months. 3. Hypertension, BP controlled.  Continue lisinopril. 4. Smokes cigarettes, marijuana.  Cessation advised.  Follow-up in  6 months.     Medication Adjustments/Labs and Tests Ordered: Current medicines are reviewed at length with the patient today.  Concerns regarding medicines are outlined above.  Orders Placed This Encounter  Procedures  . Lipid panel  . EKG 12-Lead   Meds ordered this encounter  Medications  . atorvastatin (LIPITOR) 40 MG tablet    Sig: Take 1 tablet (40 mg total) by mouth daily.    Dispense:  30 tablet    Refill:  5    Patient Instructions  Medication Instructions:   Your physician recommends that you continue on your current medications as directed. Please refer to the Current Medication list given to you today.  *If you need a refill on your cardiac medications before your next appointment, please call your pharmacy*   Lab Work:  Your physician recommends that you return for a FASTING lipid profile: in 3 months   - You will need to be fasting. Please do not have anything to eat or drink after midnight the morning you have the lab work. You may only have water or black coffee with no cream or sugar. - Please go to the [facility]. You will check in at the front desk to the right as you walk into the atrium. Valet Parking is offered if needed. - No appointment needed. You may go any day between 7 am and 6 pm.   Testing/Procedures: None ordered   Follow-Up: At [facility], you and your health needs are our priority.  As part of our continuing mission to provide you with  exceptional heart care, we have created designated Provider Care Teams.  These Care Teams include your primary Cardiologist (physician) and Advanced Practice Providers (APPs -  Physician Assistants and Nurse Practitioners) who all work together to provide you with the care you need, when you need it.  We recommend signing up for the patient portal called "MyChart".  Sign up information is provided on this After Visit Summary.  MyChart is used to connect with patients for Virtual Visits (Telemedicine).  Patients are able to view lab/test results, encounter notes, upcoming appointments, etc.  Non-urgent messages can be sent to your provider as well.   To learn more about what you can do with MyChart, go to [email or web address].    Your next appointment:   6 months  The format for your next appointment:   In Person  Provider:   Kate Sable, MD   Other Instructions      Signed, Kate Sable, MD  07/23/2020 12:15 PM    [place]

## 2020-09-17 ENCOUNTER — Other Ambulatory Visit: Payer: Self-pay | Admitting: Internal Medicine

## 2020-09-17 ENCOUNTER — Other Ambulatory Visit: Payer: Self-pay | Admitting: Psychiatry

## 2020-09-17 DIAGNOSIS — F3181 Bipolar II disorder: Secondary | ICD-10-CM

## 2020-09-17 DIAGNOSIS — F422 Mixed obsessional thoughts and acts: Secondary | ICD-10-CM

## 2020-09-18 NOTE — Telephone Encounter (Signed)
Call to RS

## 2020-09-18 NOTE — Telephone Encounter (Signed)
Pt is scheduled 9/28

## 2020-10-13 ENCOUNTER — Other Ambulatory Visit: Payer: Self-pay | Admitting: Internal Medicine

## 2020-12-10 IMAGING — DX DG CHEST 2V
2 series · 2 of 2 positions shown · non-contrast
Comparison: Chest x-ray 08/24/2008.

CLINICAL DATA: Follow-up pneumonia.

EXAM:
CHEST - 2 VIEW

[chest pa]
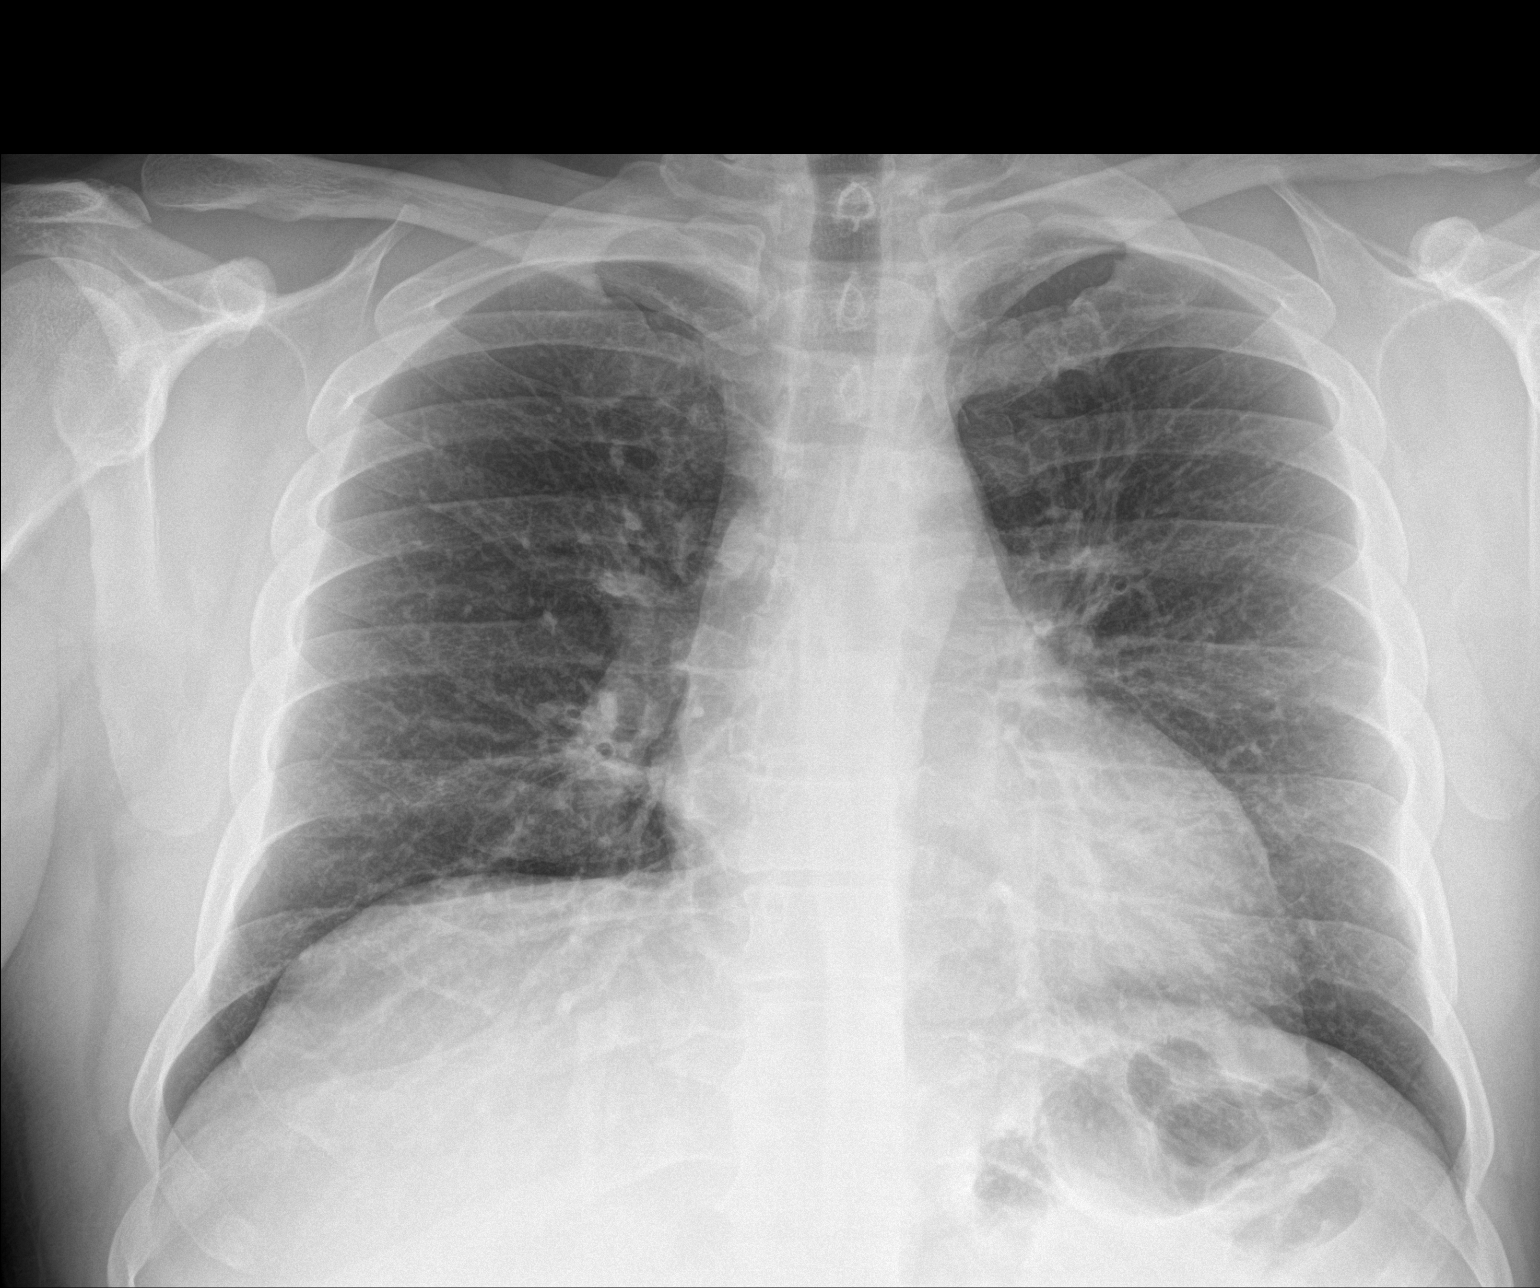

[chest lat]
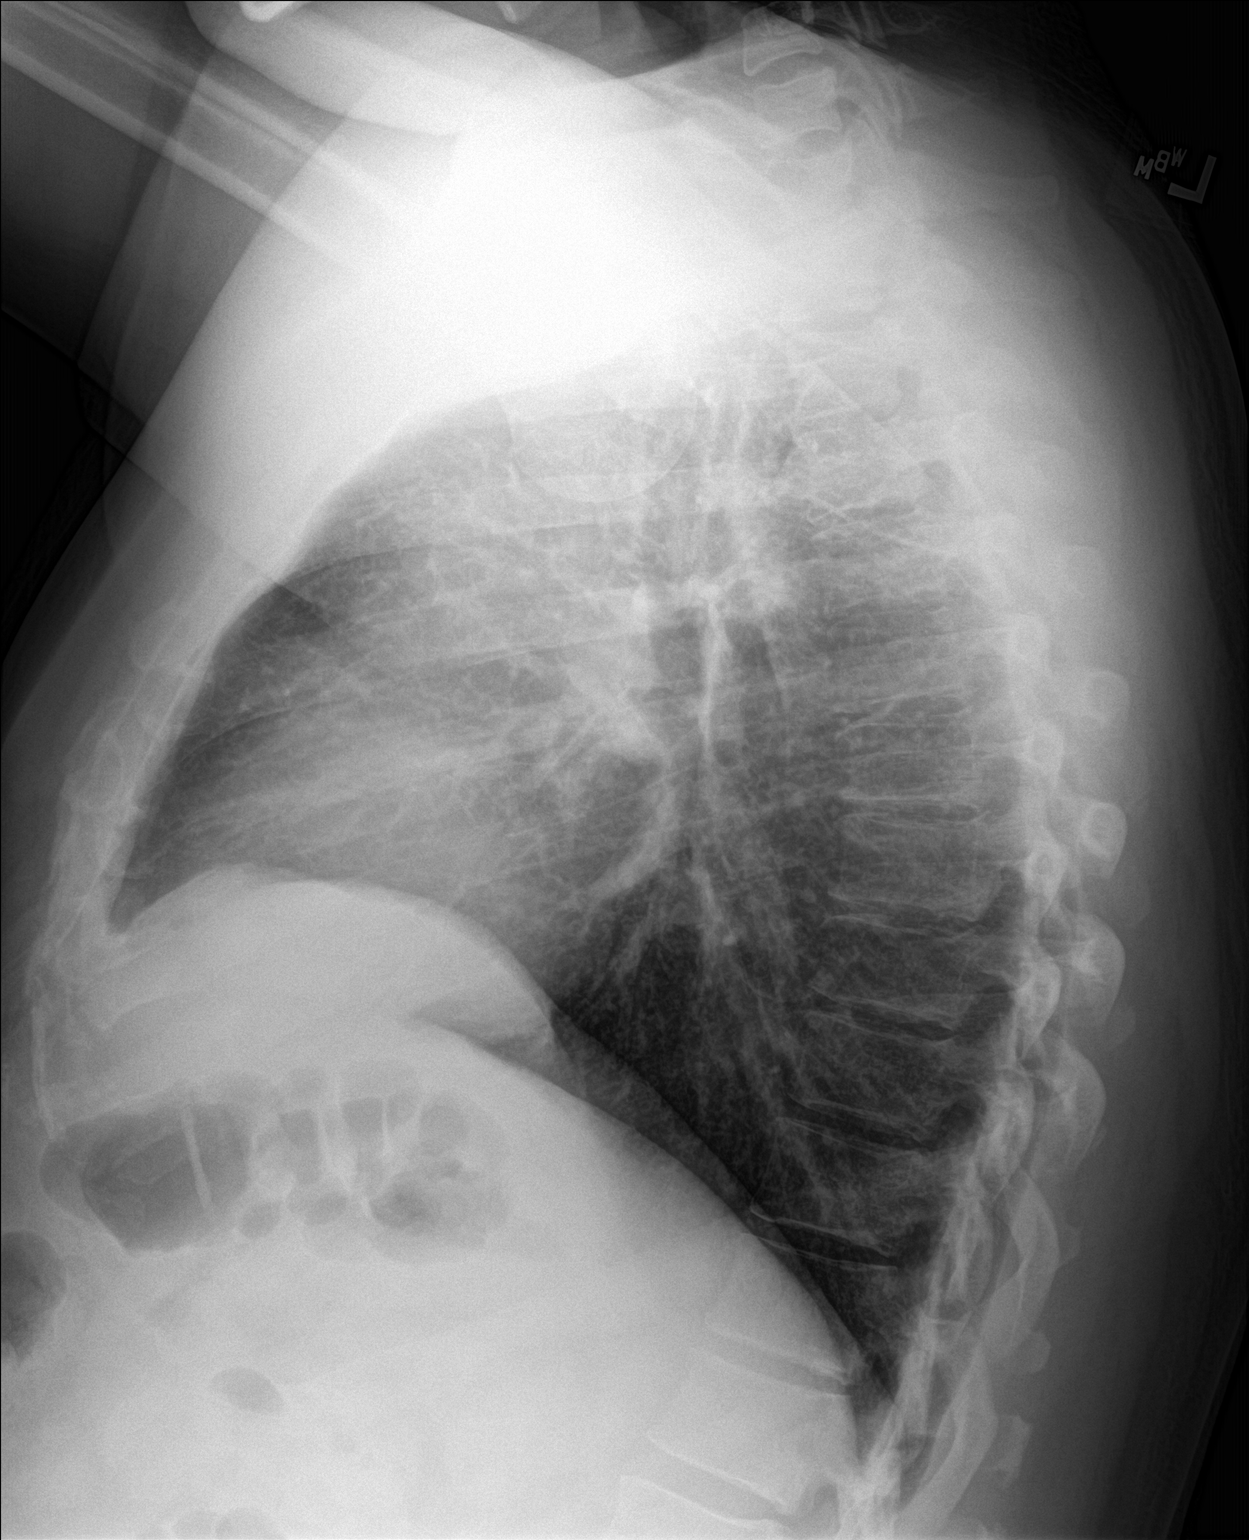

[2 of 2 positions shown; findings below may reference images not displayed]

FINDINGS: Mediastinum and hilar structures normal. Cardiomegaly with normal
pulmonary vascularity. Mild left base atelectasis/infiltrate. Mild
right base atelectasis. No pleural effusion or pneumothorax.
IMPRESSION: Mild left base atelectasis/infiltrate.  Mild right base atelectasis.

## 2020-12-15 ENCOUNTER — Other Ambulatory Visit: Payer: Self-pay | Admitting: Psychiatry

## 2020-12-15 DIAGNOSIS — F411 Generalized anxiety disorder: Secondary | ICD-10-CM

## 2020-12-15 DIAGNOSIS — F422 Mixed obsessional thoughts and acts: Secondary | ICD-10-CM

## 2020-12-15 NOTE — Telephone Encounter (Signed)
Apt in the morning 9/28

## 2020-12-16 ENCOUNTER — Other Ambulatory Visit: Payer: Self-pay

## 2020-12-16 ENCOUNTER — Ambulatory Visit (INDEPENDENT_AMBULATORY_CARE_PROVIDER_SITE_OTHER): Payer: 59 | Admitting: Psychiatry

## 2020-12-16 ENCOUNTER — Encounter: Payer: Self-pay | Admitting: Psychiatry

## 2020-12-16 DIAGNOSIS — Z9989 Dependence on other enabling machines and devices: Secondary | ICD-10-CM

## 2020-12-16 DIAGNOSIS — F422 Mixed obsessional thoughts and acts: Secondary | ICD-10-CM

## 2020-12-16 DIAGNOSIS — G4733 Obstructive sleep apnea (adult) (pediatric): Secondary | ICD-10-CM

## 2020-12-16 DIAGNOSIS — F411 Generalized anxiety disorder: Secondary | ICD-10-CM | POA: Diagnosis not present

## 2020-12-16 DIAGNOSIS — F3181 Bipolar II disorder: Secondary | ICD-10-CM

## 2020-12-16 MED ORDER — LAMOTRIGINE 25 MG PO TABS: 75.0000 mg | ORAL_TABLET | Freq: Every day | ORAL | 1 refills | Status: DC

## 2020-12-16 MED ORDER — FLUVOXAMINE MALEATE 100 MG PO TABS: 100.0000 mg | ORAL_TABLET | Freq: Every day | ORAL | 1 refills | Status: DC

## 2020-12-16 MED ORDER — DIVALPROEX SODIUM ER 500 MG PO TB24
ORAL_TABLET | ORAL | 1 refills | Status: DC
Start: 2020-12-16 — End: 2021-02-23

## 2020-12-16 NOTE — Progress Notes (Signed)
Rodney Peters [id number] 12-Mar-1976 45 y.o.  Subjective:   Patient ID:  Rodney Peters is a 45 y.o. (DOB [birth year]) male.  Chief Complaint:  Chief Complaint  Patient presents with   Bipolar II disorder [facility])   Follow-up   Anxiety   HPI Rodney Peters presents to the office today for follow-up of bipolar and anxiety.  visit was in August 2020 with no med changes.  He was doing reasonably well with  apid cycling bipolar disorder and OCD with Depakote and fluvoxamine.   04/2019 appt with following noted: r Been doing fantastic.  Really pleased with meds.  Stressful several months with Covid and I've been fine.  Managing well.  Dx severe OSA December an on  CPAP.  Remarkable.  Energy better.  Had dreaded going to bed  Before and now no problem.  More alert.  Less wakefulness.  Still some breakthrough depressive periods and more since last here.   Wife notices  And thinks that fluvoxamine has calmed him into being less bullying and less likely to argue or let the other person win an argument and wasn't that way in the past.   Wife a little nervous about the change and said he was checked out.  He says that's not true and that he's more checked in but less driven to  Prove himself to others.  Much more at ease.  Less obsessing over emails.  Quicker with work.  Feels less pressure. Feels overall a lot better, much calmer, and even better than last time.  Drowsiness still annoying but manageable.  Benefit from Luvox added (on 100mg  for awhile), despite external stressors being tough.  Has seen some improvement is OCD.  Less rewriting.  Less anxiety around it. Had severe panic attack, brutal, unusual after 4 days of half dose Depakote. Plan: L-carnitine 1000 mg twice daily for Depakote related fogginess  For depression increase lamotrigine to 75 mg daily.  01/13/20 appt with following noted:  Started L-carnitine for energy and fogginess and not having that problem. Exercising losing weight,  using CPAP and sleeping better. Increased lamotrigine to 75 mg. Doing well overall except some breakthrough.  Last week 72 hours with amped up energy, irritability, FOI, hyperverbal and pressure and impulsivity and 2 weeks before that also.  Doesn't come down the same wahy as in the past.  Not as severe s in the the past. Not as anxious with the fluvoxamine.  Had some cycling prior to Luvox added.  Better insight into hypomania. Plan: Patient's rapid cycling bipolar disorder is less well controlled with the Depakote and needs a dosage increase bc interfering with work. Increase Depakote to 1500 mg HS. Take L-carnitine 1000 mg twice daily for Depakote related fogginess  03/17/2020 appointment with the following noted: Doing very well.  Had cataclysmic month.  But he handled it well and wife and family agree.  Fogginess is better with L-carnitine.   Sleeping much better. Using CPAP. OSA was severe with AHI 60s. Better with sleep hygiene. I feel a lot better.  Patient reorts difficulty with anxiety but it's better.  OCD is better not gone.  Patient denies difficulty with sleep initiation or maintenance but vivid disturbing dreams. Denies appetite disturbance.  Patient reports that energy and motivation have been good.  Patient denies any difficulty with concentration other than distraction from OCD.  Patient denies any suicidal ideation.  Clear benefit from each of the meds added. 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  Wife complains that fluvoxamine seems to make him a little more curt and apt to be dismissive of disagreements. He's aware and trying to manage.  Not manic now. Plan no med changes  12/16/20 appt noted: Has continued Depakote ER 1500, Luvox 100, lamotrigine 75 mg daily. Been terrific.  Despite work and home stressors.  Water leak, accidents at work, etc but steady and very different this year and stable unlike past  history.  Highs and lows and anxiety seems  natural but better insight and control and has it at arms length.  Luvox Higher education careers adviser anxiety.  Less obsessive about emails.  Better control and can let it go.  Works with B-in-law who notices too. Ran out of Depakote for 3 days. More diligent with sleep and using L-carnitine in energy drink MGM whas bipolar.  M agoraphobic.  44 yo D recently started Prozac for anxiety and much bettter..  Past Psychiatric Medication Trials:  VPA 1500, Lamotrigine 75, fluvoxamine 100  Review of Systems:  Review of Systems  Constitutional:  Negative for activity change.  Cardiovascular:  Negative for palpitations.  Neurological:  Negative for dizziness, tremors and weakness.  Psychiatric/Behavioral:  Negative for agitation, behavioral problems, confusion, decreased concentration, dysphoric mood, hallucinations, self-injury, sleep disturbance and suicidal ideas. The patient is not nervous/anxious and is not hyperactive.    Medications: I have reviewed the patient's current medications.  Current Outpatient Medications  Medication Sig Dispense Refill   albuterol (VENTOLIN HFA) 108 (90 Base) MCG/ACT inhaler Inhale 2 puffs into the lungs every 6 (six) hours as needed for wheezing or shortness of breath. 18 g 5   allopurinol (ZYLOPRIM) 300 MG tablet Take 1 tablet (300 mg total) by mouth daily. 90 tablet 4   colchicine 0.6 MG tablet Take 1 tablet (0.6 mg total) by mouth daily. May take an extra tab for flair 30 tablet 4   divalproex (DEPAKOTE ER) 500 MG 24 hr tablet TAKE 2 TABLETS BY MOUTH EVERY DAY (Patient taking differently: 3 tabs daily) 180 tablet 1   fluvoxaMINE (LUVOX) 100 MG tablet Take 1 tablet (100 mg total) by mouth at bedtime. 90 tablet 1   lamoTRIgine (LAMICTAL) 25 MG tablet TAKE 3 TABLETS BY MOUTH DAILY 270 tablet 1   lisinopril (ZESTRIL) 10 MG tablet Take 1 tablet (10 mg total) by mouth daily. 90 tablet 4   Multiple Vitamin (MULTIVITAMIN) tablet Take 1 tablet by mouth daily.     omeprazole (PRILOSEC)  40 MG capsule TAKE 1 CAPSULE BY MOUTH EVERY DAY 90 capsule 2   VITAMIN D, CHOLECALCIFEROL, PO Take 5,000 Units by mouth daily.      atorvastatin (LIPITOR) 40 MG tablet Take 1 tablet (40 mg total) by mouth daily. 30 tablet 5   loratadine (CLARITIN) 10 MG tablet Take 1 tablet (10 mg total) by mouth daily. 30 tablet 6   No current facility-administered medications for this visit.    Medication Side Effects: Sedation manageable. Vivid dreaming from lamotrigine is some better.  Wouldn't trade the gains for the drowsiness.  Allergies: No Known Allergies  Past Medical History:  Diagnosis Date   Abnormal TSH    Anxiety    Bipolar disorder ([facility])    GERD (gastroesophageal reflux disease)    History of mononucleosis    Hyperglycemia    Lymphocytosis    Vitamin B 12 deficiency    Vitamin D deficiency     Family History  Problem Relation Age of Onset   Anxiety disorder  Father    COPD Father    Other Sister        substance abuse   Cancer Maternal Grandmother    Hypertension Maternal Grandfather    Cancer Paternal Grandfather     Social History   Socioeconomic History   Marital status: Married    Spouse name: Not on file   Number of children: Not on file   Years of education: Not on file   Highest education level: Not on file  Occupational History   Not on file  Tobacco Use   Smoking status: Some Days    Types: Cigarettes    Start date: 1998    Last attempt to quit: 12/19/2012    Years since quitting: 7.9   Smokeless tobacco: Former    Types: Chew    Quit date: 2010   Tobacco comments:    would smoke 2 packs every 3 weeks, currently 1 a week or so  Vaping Use   Vaping Use: Former  Substance and Sexual Activity   Alcohol use: Yes    Alcohol/week: 6.0 standard drinks    Types: 6 Standard drinks or equivalent per week    Comment: weekly   Drug use: No   Sexual activity: Yes    Birth control/protection: None  Other Topics Concern   Not on file  Social History  Narrative   Not on file   Social Determinants of Health   Financial Resource Strain: Not on file  Food Insecurity: Not on file  Transportation Needs: Not on file  Physical Activity: Not on file  Stress: Not on file  Social Connections: Not on file  Intimate Partner Violence: Not on file    Past Medical History, Surgical history, Social history, and Family history were reviewed and updated as appropriate.   Involved in church.  Please see review of systems for further details on the patient's review from today.   Objective:   Physical Exam:  There were no vitals taken for this visit.  Physical Exam Constitutional:      General: He is not in acute distress.    Appearance: He is well-developed.  Musculoskeletal:        General: No deformity.  Neurological:     Mental Status: He is alert and oriented to person, place, and time.     Motor: No tremor.     Coordination: Coordination normal.     Gait: Gait normal.  Psychiatric:        Attention and Perception: He is attentive.        Mood and Affect: Mood is anxious. Mood is not depressed. Affect is not labile, blunt, angry or inappropriate.        Speech: Speech normal. Speech is not rapid and pressured.        Behavior: Behavior normal.        Thought Content: Thought content normal. Thought content does not include homicidal or suicidal ideation. Thought content does not include homicidal or suicidal plan.        Cognition and Memory: Cognition normal.        Judgment: Judgment normal.     Comments: Insight intact. No auditory or visual hallucinations. OCD present but manageable. Much calmer than he's ever been.    Lab Review:     Component Value Date/Time   NA 139 06/15/2020 1501   K 4.3 06/15/2020 1501   CL 98 06/15/2020 1501   CO2 20 06/15/2020 1501   GLUCOSE 82 06/15/2020 1501  BUN 20 06/15/2020 1501   CREATININE 0.98 06/15/2020 1501   CALCIUM 10.2 06/15/2020 1501   PROT 7.4 06/15/2020 1501   ALBUMIN 5.0  06/15/2020 1501   AST 24 06/15/2020 1501   ALT 24 06/15/2020 1501   ALKPHOS 58 06/15/2020 1501   BILITOT 0.2 06/15/2020 1501   GFRNONAA 85 12/26/2019 0828   GFRAA 98 12/26/2019 0828       Component Value Date/Time   WBC 11.5 (H) 06/15/2020 1501   RBC 5.79 06/15/2020 1501   HGB 17.3 06/15/2020 1501   HCT 49.3 06/15/2020 1501   PLT 201 06/15/2020 1501   MCV 85 06/15/2020 1501   MCH 29.9 06/15/2020 1501   MCHC 35.1 06/15/2020 1501   RDW 13.2 06/15/2020 1501   LYMPHSABS 5.6 (H) 06/15/2020 1501   EOSABS 0.5 (H) 06/15/2020 1501   BASOSABS 0.1 06/15/2020 1501    No results found for: POCLITH, LITHIUM   No results found for: PHENYTOIN, PHENOBARB, VALPROATE, CBMZ   Remote VPA 63 on 750mg /d Oct 2018 and LFT's stable.  .res Assessment: Plan:    Loghan was seen today for bipolar ii disorder (hcc), follow-up and anxiety.  Diagnoses and all orders for this visit:  Bipolar II disorder ([place])  Mixed obsessional thoughts and acts  Generalized anxiety disorder  Obstructive sleep apnea treated with continuous positive airway pressure (CPAP)   Greater than 50% of 30 min face to face time with patient was spent on counseling and coordination of care. We discussed Patient's rapid cycling bipolar disorder is well controlled with the Depakote after dosage increase bc interfering with work. Continue Depakote to 1500 mg HS.  Rec he continue L-carnitine 1000 mg twice daily for Depakote related fogginess .  But he's thinking it might be OSA.  Takes 1 energy drink with L-carnitine Option check ammonia level discussed  For depression continue lamotrigine to 75 mg daily.  OCD is much improved and further improved since last visit also though not resolved.  He is been on fluvoxamine 9 months or so.  Much calmer with Luvox and wife notices.  Feels much calmer than ever. Continue Luvox 100 DT  Benefit .  Does not appear to be causing cycling. Thinks he has social anxiety and avoidance that does  affect life.  Prefers to be at home.  Direct with people and this can be a problem.  No med changes  Consistency with CPAP important for mental health.  Call if there are worsening symptoms.  Follow-up 3-4 mos  Lynder Parents MD, DFAPA  No future appointments.   No orders of the defined types were placed in this encounter.     -------------------------------

## 2020-12-17 IMAGING — DX DG CHEST 2V
2 series · 3 of 3 positions shown · non-contrast
Comparison: 04/26/2018

CLINICAL DATA: Follow-up pneumonia with left chest pain.

EXAM:
CHEST - 2 VIEW

[Series 1: chest pa · 0.14mm/px · 2 of 2 slices shown]
[im 1/2]
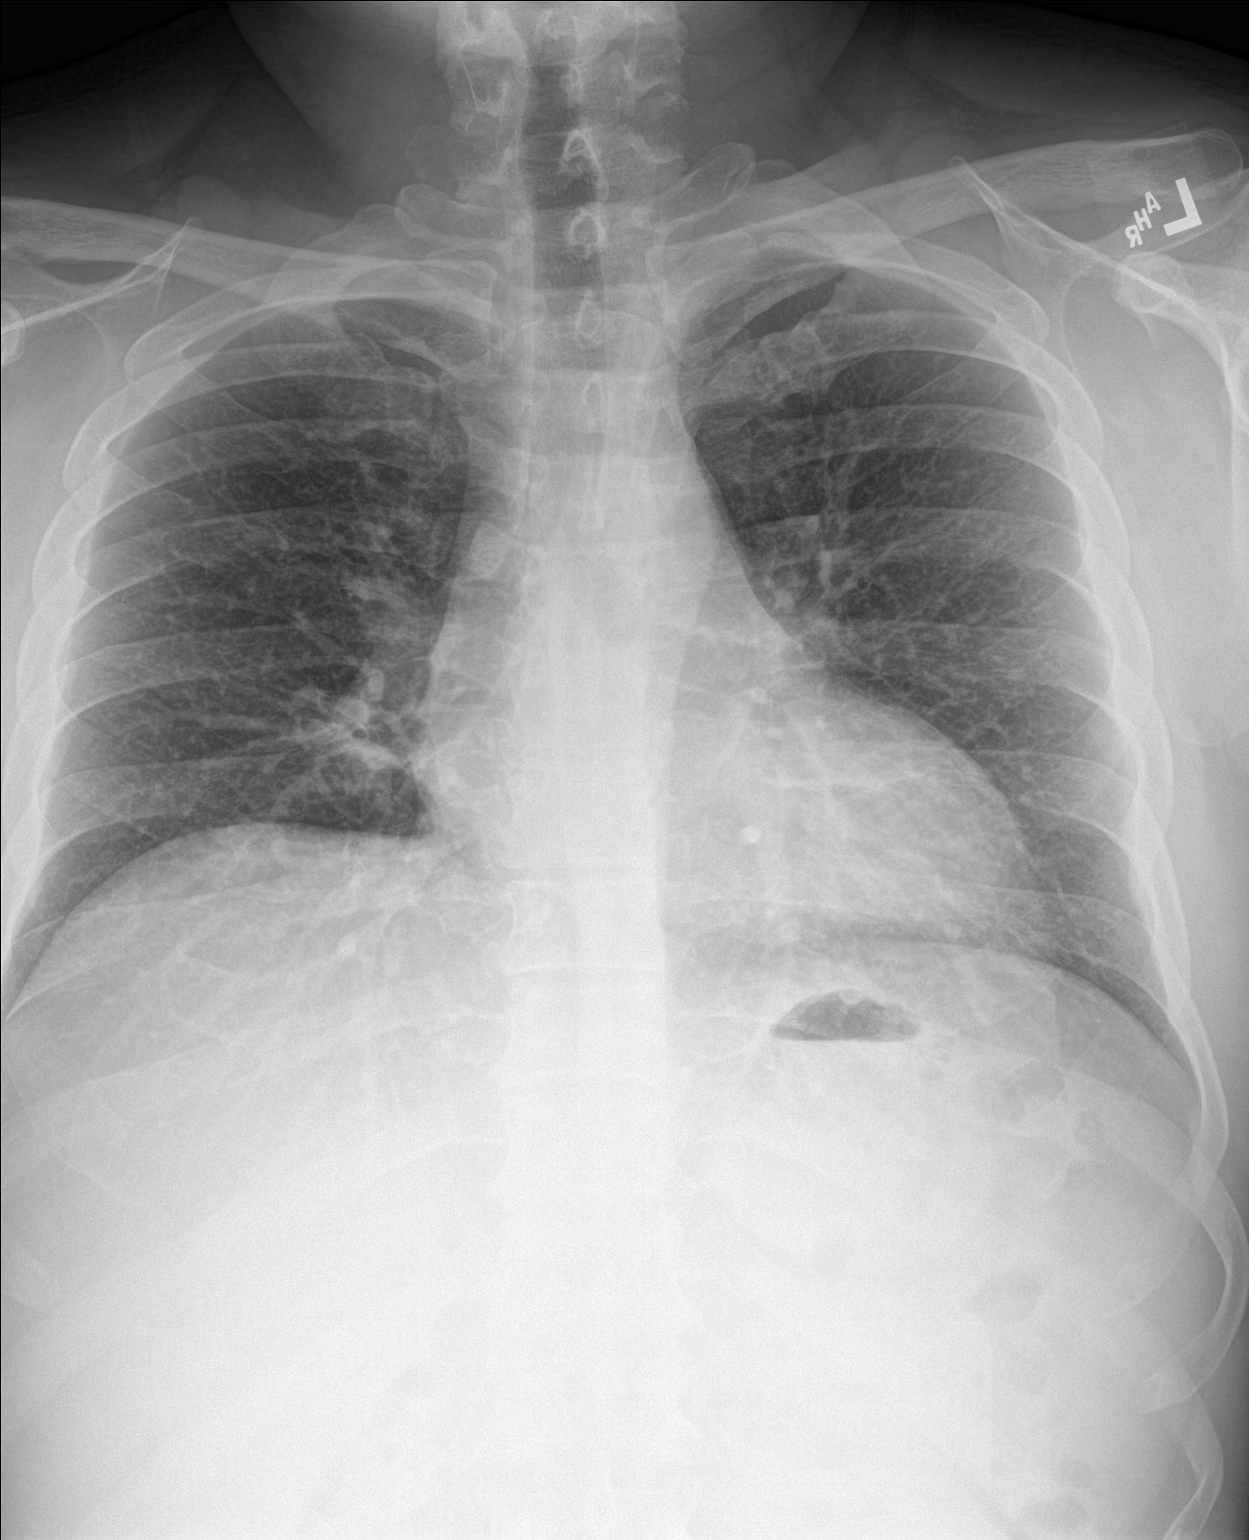
[im 2/2]
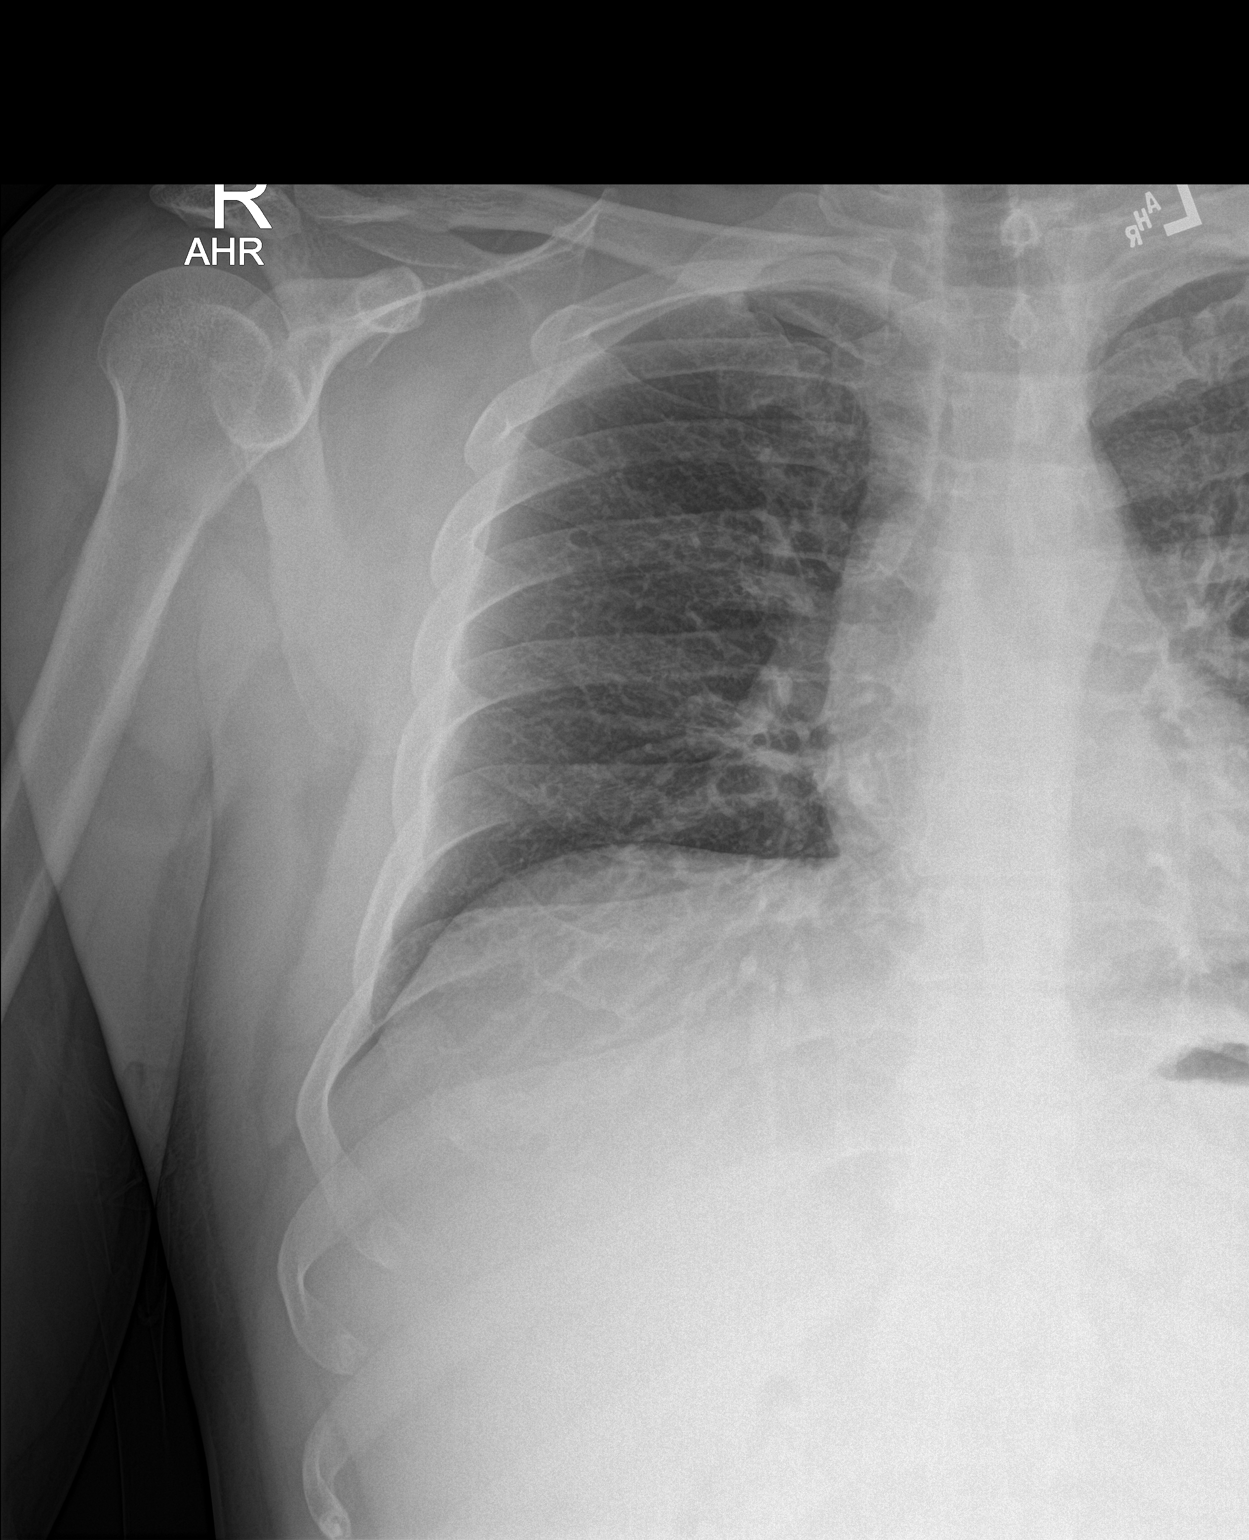

[chest lat]
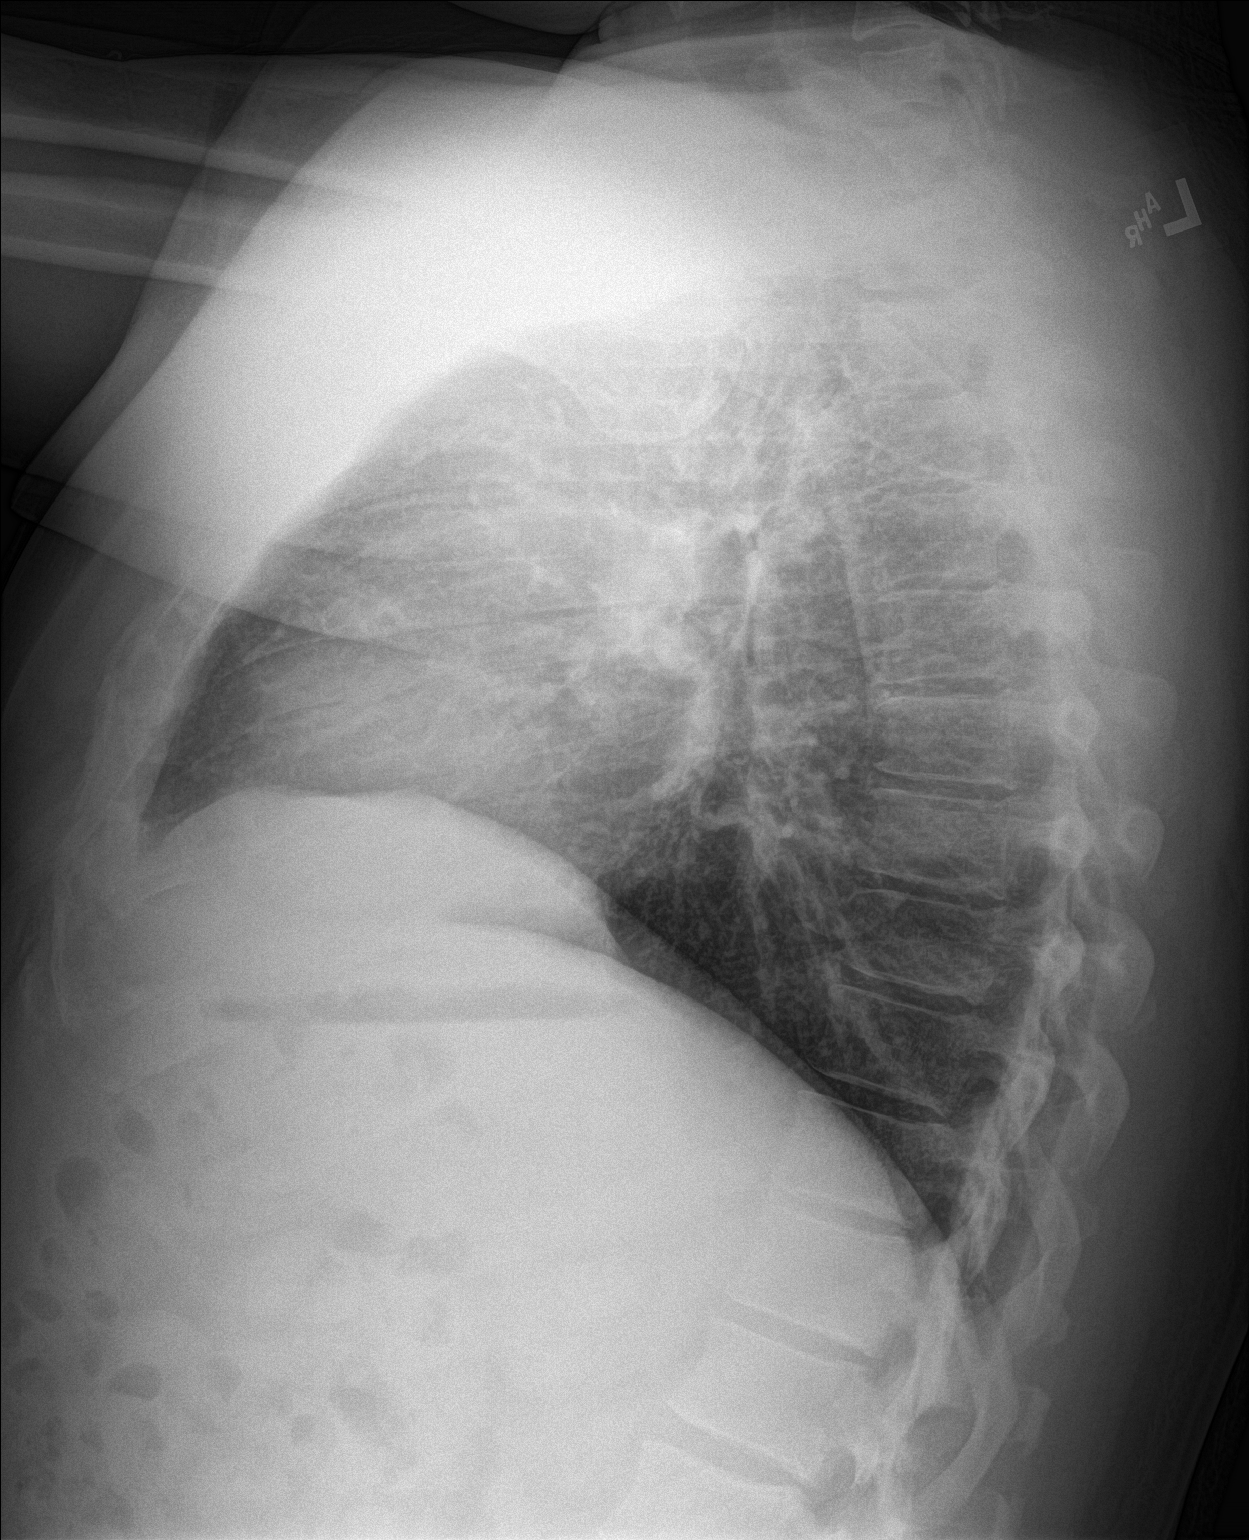

[3 of 3 positions shown; findings below may reference images not displayed]

FINDINGS: Lungs are hypoinflated without focal airspace consolidation or
effusion. Cardiomediastinal silhouette and remainder of the exam is
unchanged.
IMPRESSION: Hypoinflation without acute cardiopulmonary disease.

## 2021-01-22 ENCOUNTER — Other Ambulatory Visit: Payer: Self-pay | Admitting: Nurse Practitioner

## 2021-01-22 DIAGNOSIS — M10071 Idiopathic gout, right ankle and foot: Secondary | ICD-10-CM

## 2021-01-22 NOTE — Telephone Encounter (Signed)
Requested medications are due for refill today yes  Requested medications are on the active medication list yes  Last refill 10/17/20  Last visit 06/15/20  Future visit scheduled no  Notes to clinic failed protocol due to valid lab, uric acid, greater than 360 days ago, please assess.  Requested Prescriptions  Pending Prescriptions Disp Refills   allopurinol (ZYLOPRIM) 300 MG tablet [Pharmacy Med Name: ALLOPURINOL 300 MG TABLET] 90 tablet 4    Sig: TAKE 1 TABLET BY MOUTH EVERY DAY     Endocrinology:  Gout Agents Failed - 01/22/2021  1:46 AM      Failed - Uric Acid in normal range and within 360 days    Uric Acid  Date Value Ref Range Status  12/26/2019 7.9 3.8 - 8.4 mg/dL Final    Comment:               Therapeutic target for gout patients: <6.0          Passed - Cr in normal range and within 360 days    Creatinine, Ser  Date Value Ref Range Status  06/15/2020 0.98 0.76 - 1.27 mg/dL Final          Passed - Valid encounter within last 12 months    Recent Outpatient Visits           7 months ago Chest pain, unspecified type   [facility] [place], Lauren A, NP   1 year ago Acute idiopathic gout of right foot   [place], [place] T, NP   2 years ago Chronic cough   [facility] [place], [place], DO   2 years ago Essential hypertension   [place], [place], DO   2 years ago Essential hypertension   [facility] [place], Megan P, DO              Signed Prescriptions Disp Refills   lisinopril (ZESTRIL) 10 MG tablet 30 tablet 0    Sig: TAKE 1 TABLET BY MOUTH EVERY DAY     Cardiovascular:  ACE Inhibitors Failed - 01/22/2021  1:46 AM      Failed - Cr in normal range and within 180 days    Creatinine, Ser  Date Value Ref Range Status  06/15/2020 0.98 0.76 - 1.27 mg/dL Final          Failed - K in normal range and within 180 days    Potassium  Date Value Ref Range Status   06/15/2020 4.3 3.5 - 5.2 mmol/L Final          Failed - Valid encounter within last 6 months    Recent Outpatient Visits           7 months ago Chest pain, unspecified type   [facility] [place], Lauren A, NP   1 year ago Acute idiopathic gout of right foot   [place], Bracey T, NP   2 years ago Chronic cough   [place], [place], DO   2 years ago Essential hypertension   [place], [place], DO   2 years ago Essential hypertension   [place], [place], DO              Passed - Patient is not pregnant      Passed - Last BP in normal range    BP Readings from Last 1 Encounters:  07/23/20 122/80

## 2021-01-22 NOTE — Telephone Encounter (Signed)
Requested Prescriptions  Pending Prescriptions Disp Refills  . lisinopril (ZESTRIL) 10 MG tablet [Pharmacy Med Name: LISINOPRIL 10 MG TABLET] 30 tablet 0    Sig: TAKE 1 TABLET BY MOUTH EVERY DAY     Cardiovascular:  ACE Inhibitors Failed - 01/22/2021  1:46 AM      Failed - Cr in normal range and within 180 days    Creatinine, Ser  Date Value Ref Range Status  06/15/2020 0.98 0.76 - 1.27 mg/dL Final         Failed - K in normal range and within 180 days    Potassium  Date Value Ref Range Status  06/15/2020 4.3 3.5 - 5.2 mmol/L Final         Failed - Valid encounter within last 6 months    Recent Outpatient Visits          7 months ago Chest pain, unspecified type   [facility], Lauren A, NP   1 year ago Acute idiopathic gout of right foot   [place], [place] T, NP   2 years ago Chronic cough   [place], [place], DO   2 years ago Essential hypertension   [place], [place], DO   2 years ago Essential hypertension   [place], [place], DO             Passed - Patient is not pregnant      Passed - Last BP in normal range    BP Readings from Last 1 Encounters:  07/23/20 122/80         . allopurinol (ZYLOPRIM) 300 MG tablet [Pharmacy Med Name: ALLOPURINOL 300 MG TABLET] 90 tablet 4    Sig: TAKE 1 TABLET BY MOUTH EVERY DAY     Endocrinology:  Gout Agents Failed - 01/22/2021  1:46 AM      Failed - Uric Acid in normal range and within 360 days    Uric Acid  Date Value Ref Range Status  12/26/2019 7.9 3.8 - 8.4 mg/dL Final    Comment:               Therapeutic target for gout patients: <6.0         Passed - Cr in normal range and within 360 days    Creatinine, Ser  Date Value Ref Range Status  06/15/2020 0.98 0.76 - 1.27 mg/dL Final         Passed - Valid encounter within last 12 months    Recent Outpatient Visits          7 months ago Chest pain,  unspecified type   [facility] [place], Lauren A, NP   1 year ago Acute idiopathic gout of right foot   [place], Henrine Screws T, NP   2 years ago Chronic cough   [facility] [place], [place], DO   2 years ago Essential hypertension   [place], [place], DO   2 years ago Essential hypertension   [place], [place], DO

## 2021-02-05 ENCOUNTER — Other Ambulatory Visit: Payer: Self-pay | Admitting: Internal Medicine

## 2021-02-05 NOTE — Telephone Encounter (Signed)
Pt called, advised pt appt was needed to refill BP med. Scheduled OV appt for 02/09/21 at 0900 with Dr. Neomia Dear. Will send to office for approval of 90 day supply request.

## 2021-02-05 NOTE — Telephone Encounter (Signed)
Requested medication (s) are due for refill today: NO  Requested medication (s) are on the active medication list: YES  Last refill:  01/22/21 #30/0 RF  Future visit scheduled: Yes, 02/09/21 0900  Notes to clinic:  request for 90 day supply, was able to schedule pt appt with PCP     Requested Prescriptions  Pending Prescriptions Disp Refills   lisinopril (ZESTRIL) 10 MG tablet [Pharmacy Med Name: LISINOPRIL 10 MG TABLET] 90 tablet 1    Sig: TAKE 1 TABLET BY MOUTH EVERY DAY     Cardiovascular:  ACE Inhibitors Failed - 02/05/2021 10:06 AM      Failed - Cr in normal range and within 180 days    Creatinine, Ser  Date Value Ref Range Status  06/15/2020 0.98 0.76 - 1.27 mg/dL Final          Failed - K in normal range and within 180 days    Potassium  Date Value Ref Range Status  06/15/2020 4.3 3.5 - 5.2 mmol/L Final          Failed - Valid encounter within last 6 months    Recent Outpatient Visits           7 months ago Chest pain, unspecified type   [place], Lauren A, NP   1 year ago Acute idiopathic gout of right foot   [place], [place] T, NP   2 years ago Chronic cough   [facility] [place], [place], DO   2 years ago Essential hypertension   [place], [place], DO   2 years ago Essential hypertension   [place], Megan P, DO       Future Appointments             In 4 days Vigg, Avanti, MD [facility], [place] - Patient is not pregnant      Passed - Last BP in normal range    BP Readings from Last 1 Encounters:  07/23/20 122/80

## 2021-02-09 ENCOUNTER — Other Ambulatory Visit: Payer: Self-pay

## 2021-02-09 ENCOUNTER — Ambulatory Visit: Payer: 59 | Admitting: Internal Medicine

## 2021-02-09 ENCOUNTER — Encounter: Payer: Self-pay | Admitting: Internal Medicine

## 2021-02-09 VITALS — BP 136/89 | HR 72 | Temp 97.8°F | Ht 70.08 in | Wt 270.2 lb

## 2021-02-09 DIAGNOSIS — E782 Mixed hyperlipidemia: Secondary | ICD-10-CM | POA: Diagnosis not present

## 2021-02-09 DIAGNOSIS — M1A09X0 Idiopathic chronic gout, multiple sites, without tophus (tophi): Secondary | ICD-10-CM | POA: Diagnosis not present

## 2021-02-09 DIAGNOSIS — E785 Hyperlipidemia, unspecified: Secondary | ICD-10-CM | POA: Diagnosis not present

## 2021-02-09 MED ORDER — FENOFIBRATE 145 MG PO TABS: 145.0000 mg | ORAL_TABLET | Freq: Every day | ORAL | 5 refills | Status: DC

## 2021-02-09 NOTE — Progress Notes (Signed)
BP 136/89   Pulse 72   Temp 97.8 F (36.6 C) (Oral)   Ht 5' 10.08" (1.78 m)   Wt 270 lb 3.2 oz (122.6 kg)   SpO2 96%   BMI 38.68 kg/m    Subjective:    Patient ID: Rodney Peters, male    DOB: May 24, 1975, 45 y.o.   MRN: [id number]  Chief Complaint  Patient presents with   Hypertension    HPI: Rodney Peters is a 45 y.o. male  Pt is here to establish care with me. Seen in march last ho HTN/ HLD seen cards in may for irregular HR - pt was found to be in NS  Hypertension This is a chronic problem. The current episode started more than 1 year ago. The problem is controlled. Pertinent negatives include no chest pain, headaches, palpitations or shortness of breath. There is no history of chronic renal disease.  Hyperlipidemia This is a chronic problem. The current episode started more than 1 year ago. The problem is controlled. He has no history of chronic renal disease. Pertinent negatives include no chest pain, myalgias or shortness of breath.   Chief Complaint  Patient presents with   Hypertension    Relevant past medical, surgical, family and social history reviewed and updated as indicated. Interim medical history since our last visit reviewed. Allergies and medications reviewed and updated.  Review of Systems  Constitutional:  Negative for activity change, appetite change, chills, fatigue and fever.  HENT:  Negative for congestion, ear discharge, ear pain and facial swelling.   Eyes:  Negative for pain, discharge and itching.  Respiratory:  Negative for cough, chest tightness, shortness of breath and wheezing.   Cardiovascular:  Negative for chest pain, palpitations and leg swelling.  Gastrointestinal:  Negative for abdominal distention, abdominal pain, blood in stool, constipation, diarrhea, nausea and vomiting.  Endocrine: Negative for cold intolerance, heat intolerance, polydipsia, polyphagia and polyuria.  Genitourinary:  Negative for difficulty urinating,  dysuria, flank pain, frequency, hematuria and urgency.  Musculoskeletal:  Negative for arthralgias, gait problem, joint swelling and myalgias.  Skin:  Negative for color change, rash and wound.  Neurological:  Negative for dizziness, weakness, light-headedness, numbness and headaches.  Psychiatric/Behavioral:  Negative for confusion, sleep disturbance and suicidal ideas.    Per HPI unless specifically indicated above     Objective:    BP 136/89   Pulse 72   Temp 97.8 F (36.6 C) (Oral)   Ht 5' 10.08" (1.78 m)   Wt 270 lb 3.2 oz (122.6 kg)   SpO2 96%   BMI 38.68 kg/m   Wt Readings from Last 3 Encounters:  02/09/21 270 lb 3.2 oz (122.6 kg)  07/23/20 268 lb (121.6 kg)  06/15/20 269 lb 9.6 oz (122.3 kg)    Physical Exam Vitals and nursing note reviewed.  Constitutional:      General: He is not in acute distress.    Appearance: Normal appearance. He is not ill-appearing or diaphoretic.  HENT:     Head: Normocephalic and atraumatic.     Right Ear: Tympanic membrane and external ear normal. There is no impacted cerumen.     Left Ear: External ear normal.     Nose: No congestion or rhinorrhea.     Mouth/Throat:     Pharynx: No oropharyngeal exudate or posterior oropharyngeal erythema.  Eyes:     Conjunctiva/sclera: Conjunctivae normal.     Pupils: Pupils are equal, round, and reactive to light.  Cardiovascular:  Rate and Rhythm: Normal rate and regular rhythm.     Heart sounds: No murmur heard.   No friction rub. No gallop.  Pulmonary:     Effort: No respiratory distress.     Breath sounds: No stridor. No wheezing or rhonchi.  Chest:     Chest wall: No tenderness.  Abdominal:     General: Abdomen is flat. Bowel sounds are normal.     Palpations: Abdomen is soft. There is no mass.     Tenderness: There is no abdominal tenderness.  Musculoskeletal:     Cervical back: Normal range of motion and neck supple. No rigidity or tenderness.     Left lower leg: No edema.   Skin:    General: Skin is warm and dry.  Neurological:     Mental Status: He is alert.    Results for orders placed or performed in visit on 06/15/20  Comp Met (CMET)  Result Value Ref Range   Glucose 82 65 - 99 mg/dL   BUN 20 6 - 24 mg/dL   Creatinine, Ser 0.98 0.76 - 1.27 mg/dL   eGFR 98 >59 mL/min/1.73   BUN/Creatinine Ratio 20 9 - 20   Sodium 139 134 - 144 mmol/L   Potassium 4.3 3.5 - 5.2 mmol/L   Chloride 98 96 - 106 mmol/L   CO2 20 20 - 29 mmol/L   Calcium 10.2 8.7 - 10.2 mg/dL   Total Protein 7.4 6.0 - 8.5 g/dL   Albumin 5.0 4.0 - 5.0 g/dL   Globulin, Total 2.4 1.5 - 4.5 g/dL   Albumin/Globulin Ratio 2.1 1.2 - 2.2   Bilirubin Total 0.2 0.0 - 1.2 mg/dL   Alkaline Phosphatase 58 44 - 121 IU/L   AST 24 0 - 40 IU/L   ALT 24 0 - 44 IU/L  CBC with Differential  Result Value Ref Range   WBC 11.5 (H) 3.4 - 10.8 x10E3/uL   RBC 5.79 4.14 - 5.80 x10E6/uL   Hemoglobin 17.3 13.0 - 17.7 g/dL   Hematocrit 49.3 37.5 - 51.0 %   MCV 85 79 - 97 fL   MCH 29.9 26.6 - 33.0 pg   MCHC 35.1 31.5 - 35.7 g/dL   RDW 13.2 11.6 - 15.4 %   Platelets 201 150 - 450 x10E3/uL   Neutrophils 38 Not Estab. %   Lymphs 49 Not Estab. %   Monocytes 8 Not Estab. %   Eos 4 Not Estab. %   Basos 1 Not Estab. %   Neutrophils Absolute 4.4 1.4 - 7.0 x10E3/uL   Lymphocytes Absolute 5.6 (H) 0.7 - 3.1 x10E3/uL   Monocytes Absolute 0.9 0.1 - 0.9 x10E3/uL   EOS (ABSOLUTE) 0.5 (H) 0.0 - 0.4 x10E3/uL   Basophils Absolute 0.1 0.0 - 0.2 x10E3/uL   Immature Granulocytes 0 Not Estab. %   Immature Grans (Abs) 0.0 0.0 - 0.1 x10E3/uL  Lipid Panel w/o Chol/HDL Ratio  Result Value Ref Range   Cholesterol, Total 295 (H) 100 - 199 mg/dL   Triglycerides 737 (HH) 0 - 149 mg/dL   HDL 29 (L) >39 mg/dL   VLDL Cholesterol Cal 138 (H) 5 - 40 mg/dL   LDL Chol Calc (NIH) 128 (H) 0 - 99 mg/dL        Current Outpatient Medications:    albuterol (VENTOLIN HFA) 108 (90 Base) MCG/ACT inhaler, Inhale 2 puffs into the lungs  every 6 (six) hours as needed for wheezing or shortness of breath., Disp: 18 g, Rfl: 5  allopurinol (ZYLOPRIM) 300 MG tablet, TAKE 1 TABLET BY MOUTH EVERY DAY, Disp: 90 tablet, Rfl: 4   colchicine 0.6 MG tablet, Take 1 tablet (0.6 mg total) by mouth daily. May take an extra tab for flair, Disp: 30 tablet, Rfl: 4   divalproex (DEPAKOTE ER) 500 MG 24 hr tablet, 3 tabs daily, Disp: 270 tablet, Rfl: 1   fluticasone (FLONASE) 50 MCG/ACT nasal spray, Place 2 sprays into both nostrils daily., Disp: , Rfl:    fluvoxaMINE (LUVOX) 100 MG tablet, Take 1 tablet (100 mg total) by mouth at bedtime., Disp: 90 tablet, Rfl: 1   lamoTRIgine (LAMICTAL) 25 MG tablet, Take 3 tablets (75 mg total) by mouth daily., Disp: 270 tablet, Rfl: 1   lisinopril (ZESTRIL) 10 MG tablet, TAKE 1 TABLET BY MOUTH EVERY DAY, Disp: 30 tablet, Rfl: 0   Multiple Vitamin (MULTIVITAMIN) tablet, Take 1 tablet by mouth daily., Disp: , Rfl:    omeprazole (PRILOSEC) 40 MG capsule, TAKE 1 CAPSULE BY MOUTH EVERY DAY, Disp: 90 capsule, Rfl: 2   VITAMIN D, CHOLECALCIFEROL, PO, Take 5,000 Units by mouth daily. , Disp: , Rfl:    loratadine (CLARITIN) 10 MG tablet, Take 1 tablet (10 mg total) by mouth daily., Disp: 30 tablet, Rfl: 6    Assessment & Plan:   HTN : is on lisinopril 10 mg. Continue current meds.  Medication compliance emphasised. pt advised to keep Bp logs. Pt verbalised understanding of the same. Pt to have a low salt diet . Exercise to reach a goal of at least 150 mins a week.  lifestyle modifications explained and pt understands importance of the above.   HLD:  pt has reservations to taking lipitor , talked to him for a while about how elevated chol can cause him to have a stroke or a heart attack, pt agreed to start on fenofibrate today.  Will recheck in 2 months.  Pt was non compliant to taking his lipitor. Unsure why says he knows the complications, says he just doesn't care. D/w him risk factors including having a stroke or a  heart attack, he seems to understand this after a long debate and will lstart taking fenofibrate.  Sent to pharmacy   Latest Reference Range & Units 08/13/15 10:18 08/16/16 08:56 02/13/18 09:49 11/20/18 08:54 12/26/19 08:28 06/15/20 15:01  Total CHOL/HDL Ratio 0.0 - 5.0 ratio  7.4 (H) 7.4 (H)     Cholesterol, Total 100 - 199 mg/dL 204 (H) 215 (H) 223 (H)  294 (H) 295 (H)  HDL Cholesterol >39 mg/dL 36 (L) 29 (L) 30 (L)  28 (L) 29 (L)  LDL (calc) 0 - 99 mg/dL 97 Comment 114 (H)     MICROALB/CREAT RATIO <30 mg/g    30-300 (H)    Triglycerides 0 - 149 mg/dL 354 (H) 645 (HH) 396 (H)  948 (HH) 737 (HH)  VLDL Cholesterol Cal 5 - 40 mg/dL 71 (H) Comment 79 (H)  Comment ! 138 (H)  LDL Chol Calc (NIH) 0 - 99 mg/dL     Comment ! 128 (H)  (HH): Data is critically high (H): Data is abnormally high (L): Data is abnormally low !: Data is abnormal  HLD: TG very high :  Pt says he doesn't want to take meds he says he feels he understands things like he will have a stroke or a heart attack secondary to such. recheck FLP, check LFT's work on diet, SE of meds explained to pt. low fat and high fiber diet explained  to pt.   BPD is on lamictal and depakote for Dr. Clovis Pu @ Psych  To d/w his psychiatrist about the medications issue as well.    Gout: is on allopurinol Uric acid  is on allopurinol will check URIC acid levels today Consider uloric for prevention of such   Problem List Items Addressed This Visit   None Visit Diagnoses     Hyperlipidemia, unspecified hyperlipidemia type    -  Primary   Relevant Orders   Comprehensive metabolic panel   CBC with Differential/Platelet   Lipid panel   TSH        Orders Placed This Encounter  Procedures   Comprehensive metabolic panel   CBC with Differential/Platelet   Lipid panel   TSH     Meds ordered this encounter  Medications   fenofibrate (TRICOR) 145 MG tablet    Sig: Take 1 tablet (145 mg total) by mouth daily.    Dispense:  30 tablet     Refill:  5      Follow up plan: No follow-ups on file. Total time spent with patient was > 30 mins including charting and cordinating care with specialists.

## 2021-02-09 NOTE — Patient Instructions (Signed)

## 2021-02-19 ENCOUNTER — Telehealth: Payer: Self-pay | Admitting: Internal Medicine

## 2021-02-19 NOTE — Telephone Encounter (Signed)
Copied from [place] [phone]. Topic: General - Other >> Feb 18, 2021  5:58 PM Pawlus, Brayton Layman A wrote: Reason for CRM: Maudie Mercury from [facility] health clinical support was calling to follow up on an authorization that was sent over for this pt, auth # [id number].

## 2021-02-23 ENCOUNTER — Other Ambulatory Visit: Payer: Self-pay | Admitting: Psychiatry

## 2021-02-23 ENCOUNTER — Other Ambulatory Visit: Payer: Self-pay | Admitting: Internal Medicine

## 2021-02-23 DIAGNOSIS — F3181 Bipolar II disorder: Secondary | ICD-10-CM

## 2021-04-12 ENCOUNTER — Ambulatory Visit: Payer: Self-pay | Admitting: Internal Medicine

## 2021-04-21 DIAGNOSIS — N429 Disorder of prostate, unspecified: Secondary | ICD-10-CM | POA: Insufficient documentation

## 2021-05-19 ENCOUNTER — Other Ambulatory Visit: Payer: Self-pay | Admitting: Internal Medicine

## 2021-05-20 NOTE — Telephone Encounter (Signed)
Requested Prescriptions  ?Pending Prescriptions Disp Refills  ?? lisinopril (ZESTRIL) 10 MG tablet [Pharmacy Med Name: LISINOPRIL 10 MG TABLET] 90 tablet 0  ?  Sig: TAKE 1 TABLET BY MOUTH EVERY DAY  ?  ? Cardiovascular:  ACE Inhibitors Failed - 05/19/2021  7:12 PM  ?  ?  Failed - Cr in normal range and within 180 days  ?  Creatinine, Ser  ?Date Value Ref Range Status  ?06/15/2020 0.98 0.76 - 1.27 mg/dL Final  ?   ?  ?  Failed - K in normal range and within 180 days  ?  Potassium  ?Date Value Ref Range Status  ?06/15/2020 4.3 3.5 - 5.2 mmol/L Final  ?   ?  ?  Passed - Patient is not pregnant  ?  ?  Passed - Last BP in normal range  ?  BP Readings from Last 1 Encounters:  ?02/09/21 136/89  ?   ?  ?  Passed - Valid encounter within last 6 months  ?  Recent Outpatient Visits   ?      ? 3 months ago Hyperlipidemia, unspecified hyperlipidemia type  ? [facility] Vigg, Avanti, MD  ? 11 months ago Chest pain, unspecified type  ? [place], NP  ? 1 year ago Acute idiopathic gout of right foot  ? [place], [place] T, NP  ? 2 years ago Chronic cough  ? [place], DO  ? 2 years ago Essential hypertension  ? [place], [place] P, DO  ?  ?  ? ?  ?  ?  ? ?

## 2021-06-15 ENCOUNTER — Ambulatory Visit (INDEPENDENT_AMBULATORY_CARE_PROVIDER_SITE_OTHER): Payer: 59 | Admitting: Psychiatry

## 2021-06-15 ENCOUNTER — Encounter: Payer: Self-pay | Admitting: Psychiatry

## 2021-06-15 ENCOUNTER — Other Ambulatory Visit: Payer: Self-pay

## 2021-06-15 DIAGNOSIS — F3181 Bipolar II disorder: Secondary | ICD-10-CM

## 2021-06-15 DIAGNOSIS — G4733 Obstructive sleep apnea (adult) (pediatric): Secondary | ICD-10-CM

## 2021-06-15 DIAGNOSIS — F422 Mixed obsessional thoughts and acts: Secondary | ICD-10-CM

## 2021-06-15 DIAGNOSIS — Z9989 Dependence on other enabling machines and devices: Secondary | ICD-10-CM

## 2021-06-15 DIAGNOSIS — R69 Illness, unspecified: Secondary | ICD-10-CM | POA: Diagnosis not present

## 2021-06-15 DIAGNOSIS — F411 Generalized anxiety disorder: Secondary | ICD-10-CM | POA: Diagnosis not present

## 2021-06-15 NOTE — Progress Notes (Signed)
Rodney Peters ?[id number] ?1975/04/14 ?46 y.o. ? ?Subjective:  ? ?Patient ID:  Rodney Peters is a 46 y.o. (DOB [birth year]) male. ? ?Chief Complaint:  ?Chief Complaint  ?Patient presents with  ? Follow-up  ? ?HPI ?Rodney Peters presents to the office today for follow-up of bipolar and anxiety. ? ?visit was in August 2020 with no med changes.  He was doing reasonably well with  ?apid cycling bipolar disorder and OCD with Depakote and fluvoxamine.  ? ?04/2019 appt with following noted: r ?Been doing fantastic.  Really pleased with meds.  Stressful several months with Covid and I've been fine.  Managing well.  ?Dx severe OSA December an on  CPAP.  Remarkable.  Energy better.  Had dreaded going to bed  Before and now no problem.  More alert.  Less wakefulness.  Still some breakthrough depressive periods and more since last here.   ?Wife notices  And thinks that fluvoxamine has calmed him into being less bullying and less likely to argue or let the other person win an argument and wasn't that way in the past.   Wife a little nervous about the change and said he was checked out.  He says that's not true and that he's more checked in but less driven to  Prove himself to others.  Much more at ease.  Less obsessing over emails.  Quicker with work.  Feels less pressure. ?Feels overall a lot better, much calmer, and even better than last time.  Drowsiness still annoying but manageable.  Benefit from Luvox added (on '100mg'$  for awhile), despite external stressors being tough.  Has seen some improvement is OCD.  Less rewriting.  Less anxiety around it. ?Had severe panic attack, brutal, unusual after 4 days of half dose Depakote. ?Plan: L-carnitine 1000 mg twice daily for Depakote related fogginess  ?For depression increase lamotrigine to 75 mg daily. ? ?01/13/20 appt with following noted:  ?Started L-carnitine for energy and fogginess and not having that problem. ?Exercising losing weight, using CPAP and sleeping  better. ?Increased lamotrigine to 75 mg. Doing well overall except some breakthrough.  Last week 72 hours with amped up energy, irritability, FOI, hyperverbal and pressure and impulsivity and 2 weeks before that also.  Doesn't come down the same wahy as in the past.  Not as severe s in the the past. ?Not as anxious with the fluvoxamine.  Had some cycling prior to Luvox added.  Better insight into hypomania. ?Plan: Patient's rapid cycling bipolar disorder is less well controlled with the Depakote and needs a dosage increase bc interfering with work. ?Increase Depakote to 1500 mg HS. ?Take L-carnitine 1000 mg twice daily for Depakote related fogginess ? ?03/17/2020 appointment with the following noted: ?Doing very well.  Had cataclysmic month.  But he handled it well and wife and family agree.  Fogginess is better with L-carnitine.   ?Sleeping much better. Using CPAP. OSA was severe with AHI 60s. Better with sleep hygiene. ?I feel a lot better. ? Patient reorts difficulty with anxiety but it's better.  OCD is better not gone.  Patient denies difficulty with sleep initiation or maintenance but vivid disturbing dreams. Denies appetite disturbance.  Patient reports that energy and motivation have been good.  Patient denies any difficulty with concentration other than distraction from OCD.  Patient denies any suicidal ideation.  Clear benefit from each of the meds added. 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  Wife complains that fluvoxamine seems  to make him a little more curt and apt to be dismissive of disagreements. He's aware and trying to manage.  Not manic now. ?Plan no med changes ? ?12/16/20 appt noted: ?Has continued Depakote ER 1500, Luvox 100, lamotrigine 75 mg daily. ?Been terrific.  Despite work and home stressors.  Water leak, accidents at work, etc but steady and very different this year and stable unlike past  history.  Highs and lows and anxiety seems natural but better  insight and control and has it at arms length.  Luvox Higher education careers adviser anxiety.  Less obsessive about emails.  Better control and can let it go.  Works with B-in-law who notices too. ?Ran out of Depakote for 3 days. ?More diligent with sleep and using L-carnitine in energy drink ?MGM whas bipolar.  M agoraphobic.  46 yo D recently started Prozac for anxiety and much bettter.. ? ?06/15/21 appt noted: ?He feels fine and clear headed.  W says high and lows esp lows more profound.  He doesn't see it as abnormal.  Not always a good judge of his mood state.  Got into argument with boss and pt doesn't think he did anything wrong.  Boss says he's unstable in mood.   ?I feel good.  May miss a dose or 2 per week.   ?Wonders if he has borderline personality disorder. ? ?Past Psychiatric Medication Trials:  VPA 1500, Lamotrigine 75, fluvoxamine 100 ?No sig therapy ? ?Review of Systems:  ?Review of Systems  ?Constitutional:  Negative for activity change.  ?Cardiovascular:  Negative for palpitations.  ?Neurological:  Negative for dizziness and tremors.  ?Psychiatric/Behavioral:  Negative for agitation, behavioral problems, confusion, decreased concentration, dysphoric mood, hallucinations, self-injury, sleep disturbance and suicidal ideas. The patient is not nervous/anxious and is not hyperactive.   ? ?Medications: I have reviewed the patient's current medications. ? ?Current Outpatient Medications  ?Medication Sig Dispense Refill  ? albuterol (VENTOLIN HFA) 108 (90 Base) MCG/ACT inhaler Inhale 2 puffs into the lungs every 6 (six) hours as needed for wheezing or shortness of breath. 18 g 5  ? allopurinol (ZYLOPRIM) 300 MG tablet TAKE 1 TABLET BY MOUTH EVERY DAY 90 tablet 4  ? colchicine 0.6 MG tablet Take 1 tablet (0.6 mg total) by mouth daily. May take an extra tab for flair 30 tablet 4  ? divalproex (DEPAKOTE ER) 500 MG 24 hr tablet TAKE 2 TABLETS BY MOUTH EVERY DAY 180 tablet 0  ? fenofibrate (TRICOR) 145 MG tablet Take 1 tablet  (145 mg total) by mouth daily. 30 tablet 5  ? fluticasone (FLONASE) 50 MCG/ACT nasal spray Place 2 sprays into both nostrils daily.    ? lamoTRIgine (LAMICTAL) 25 MG tablet Take 3 tablets (75 mg total) by mouth daily. 270 tablet 1  ? lisinopril (ZESTRIL) 10 MG tablet TAKE 1 TABLET BY MOUTH EVERY DAY 90 tablet 0  ? Multiple Vitamin (MULTIVITAMIN) tablet Take 1 tablet by mouth daily.    ? omeprazole (PRILOSEC) 40 MG capsule TAKE 1 CAPSULE BY MOUTH EVERY DAY 90 capsule 2  ? VITAMIN D, CHOLECALCIFEROL, PO Take 5,000 Units by mouth daily.     ? fluvoxaMINE (LUVOX) 100 MG tablet Take 1 tablet (100 mg total) by mouth at bedtime. (Patient not taking: Reported on 06/15/2021) 90 tablet 1  ? loratadine (CLARITIN) 10 MG tablet Take 1 tablet (10 mg total) by mouth daily. 30 tablet 6  ? ?No current facility-administered medications for this visit.  ? ? ?Medication Side Effects: Sedation manageable. Vivid  dreaming from lamotrigine is some better.  Wouldn't trade the gains for the drowsiness. ? ?Allergies: No Known Allergies ? ?Past Medical History:  ?Diagnosis Date  ? Abnormal TSH   ? Anxiety   ? Bipolar disorder ([place])   ? GERD (gastroesophageal reflux disease)   ? History of mononucleosis   ? Hyperglycemia   ? Lymphocytosis   ? Vitamin B 12 deficiency   ? Vitamin D deficiency   ? ? ?Family History  ?Problem Relation Age of Onset  ? Anxiety disorder Father   ? COPD Father   ? Other Sister   ?     substance abuse  ? Cancer Maternal Grandmother   ? Hypertension Maternal Grandfather   ? Cancer Paternal Grandfather   ? ? ?Social History  ? ?Socioeconomic History  ? Marital status: Married  ?  Spouse name: Not on file  ? Number of children: Not on file  ? Years of education: Not on file  ? Highest education level: Not on file  ?Occupational History  ? Not on file  ?Tobacco Use  ? Smoking status: Some Days  ?  Types: Cigarettes  ?  Start date: 41  ?  Last attempt to quit: 12/19/2012  ?  Years since quitting: 8.4  ? Smokeless tobacco:  Former  ?  Types: Chew  ?  Quit date: 2010  ? Tobacco comments:  ?  would smoke 2 packs every 3 weeks, currently 1 a week or so  ?Vaping Use  ? Vaping Use: Former  ?Substance and Sexual Activity  ? Alcohol use: Yes  ?  Alcoho

## 2021-06-15 NOTE — Patient Instructions (Addendum)
DB[facility] therapy [facility] ?[place] ?

## 2021-07-19 DIAGNOSIS — M25362 Other instability, left knee: Secondary | ICD-10-CM | POA: Insufficient documentation

## 2021-07-29 ENCOUNTER — Telehealth: Payer: Self-pay | Admitting: Cardiology

## 2021-07-29 NOTE — Telephone Encounter (Signed)
Patient does not feel necessary - symptoms cleared up after speaking with PCP ?If provider feels differently and thinks follow up is necessary - please let patient know ?Deleted recall  ?

## 2021-07-29 NOTE — Telephone Encounter (Signed)
Noted  

## 2021-07-31 ENCOUNTER — Other Ambulatory Visit: Payer: Self-pay | Admitting: Internal Medicine

## 2021-08-22 ENCOUNTER — Other Ambulatory Visit: Payer: Self-pay | Admitting: Family Medicine

## 2021-08-22 DIAGNOSIS — M10071 Idiopathic gout, right ankle and foot: Secondary | ICD-10-CM

## 2021-08-22 MED ORDER — COLCHICINE 0.6 MG PO TABS: 0.6000 mg | ORAL_TABLET | Freq: Every day | ORAL | 0 refills | Status: DC

## 2021-08-22 MED ORDER — OMEPRAZOLE 40 MG PO CPDR: DELAYED_RELEASE_CAPSULE | ORAL | 0 refills | Status: DC

## 2021-08-22 MED ORDER — LISINOPRIL 10 MG PO TABS: 10.0000 mg | ORAL_TABLET | Freq: Every day | ORAL | 0 refills | Status: DC

## 2021-08-30 ENCOUNTER — Encounter: Payer: Self-pay | Admitting: Family Medicine

## 2021-08-30 ENCOUNTER — Ambulatory Visit (INDEPENDENT_AMBULATORY_CARE_PROVIDER_SITE_OTHER): Payer: 59 | Admitting: Family Medicine

## 2021-08-30 VITALS — BP 126/77 | HR 76 | Temp 98.4°F | Wt 271.2 lb

## 2021-08-30 DIAGNOSIS — M10071 Idiopathic gout, right ankle and foot: Secondary | ICD-10-CM

## 2021-08-30 DIAGNOSIS — R079 Chest pain, unspecified: Secondary | ICD-10-CM

## 2021-08-30 DIAGNOSIS — M25562 Pain in left knee: Secondary | ICD-10-CM

## 2021-08-30 DIAGNOSIS — J4540 Moderate persistent asthma, uncomplicated: Secondary | ICD-10-CM

## 2021-08-30 DIAGNOSIS — R69 Illness, unspecified: Secondary | ICD-10-CM | POA: Diagnosis not present

## 2021-08-30 DIAGNOSIS — Z0000 Encounter for general adult medical examination without abnormal findings: Secondary | ICD-10-CM | POA: Diagnosis not present

## 2021-08-30 DIAGNOSIS — M1A09X0 Idiopathic chronic gout, multiple sites, without tophus (tophi): Secondary | ICD-10-CM | POA: Diagnosis not present

## 2021-08-30 DIAGNOSIS — E785 Hyperlipidemia, unspecified: Secondary | ICD-10-CM

## 2021-08-30 DIAGNOSIS — M25511 Pain in right shoulder: Secondary | ICD-10-CM

## 2021-08-30 DIAGNOSIS — Z1211 Encounter for screening for malignant neoplasm of colon: Secondary | ICD-10-CM

## 2021-08-30 DIAGNOSIS — F3181 Bipolar II disorder: Secondary | ICD-10-CM

## 2021-08-30 DIAGNOSIS — E559 Vitamin D deficiency, unspecified: Secondary | ICD-10-CM | POA: Diagnosis not present

## 2021-08-30 DIAGNOSIS — I10 Essential (primary) hypertension: Secondary | ICD-10-CM

## 2021-08-30 DIAGNOSIS — Z23 Encounter for immunization: Secondary | ICD-10-CM

## 2021-08-30 LAB — MICROALBUMIN, URINE WAIVED
Creatinine, Urine Waived: 100 mg/dL (ref 10–300)
Microalb, Ur Waived: 10 mg/L (ref 0–19)
Microalb/Creat Ratio: <30 mg/g (ref <30)

## 2021-08-30 MED ORDER — OMEPRAZOLE 40 MG PO CPDR: DELAYED_RELEASE_CAPSULE | ORAL | 1 refills | Status: DC

## 2021-08-30 MED ORDER — ALBUTEROL SULFATE HFA 108 (90 BASE) MCG/ACT IN AERS: 2.0000 | INHALATION_SPRAY | Freq: Four times a day (QID) | RESPIRATORY_TRACT | 5 refills | Status: DC | PRN

## 2021-08-30 MED ORDER — LISINOPRIL 10 MG PO TABS
10.0000 mg | ORAL_TABLET | Freq: Every day | ORAL | 1 refills | Status: DC
Start: 2021-08-30 — End: 2022-03-08

## 2021-08-30 MED ORDER — FENOFIBRATE 145 MG PO TABS: 145.0000 mg | ORAL_TABLET | Freq: Every day | ORAL | 1 refills | Status: DC

## 2021-08-30 NOTE — Assessment & Plan Note (Signed)
Under good control on current regimen. Continue current regimen. Continue to monitor. Call with any concerns. Refills given. Labs drawn today.   

## 2021-08-30 NOTE — Assessment & Plan Note (Signed)
Rechecking labs today. Await results. Treat as needed.  °

## 2021-08-30 NOTE — Assessment & Plan Note (Signed)
Following with psychiatry. Due to see them in 2 weeks. Will check labs. Continue to follow with them. Call with any concerns.

## 2021-08-30 NOTE — Progress Notes (Signed)
BP 126/77   Pulse 76   Temp 98.4 F (36.9 C) (Oral)   Wt 271 lb 3.2 oz (123 kg)   SpO2 97%   BMI 38.83 kg/m    Subjective:    Patient ID: Rodney Peters, male    DOB: Jan 02, 1976, 46 y.o.   MRN: [id number]  HPI: Rodney Peters is a 46 y.o. male presenting on 08/30/2021 for comprehensive medical examination. Current medical complaints include:  HYPERTENSION / HYPERLIPIDEMIA Satisfied with current treatment? yes Duration of hypertension: chronic BP monitoring frequency: not checking BP medication side effects: no Past BP meds: lisinopril Duration of hyperlipidemia: chronic Cholesterol medication side effects: no Cholesterol supplements: none Past cholesterol medications: fenofibrate Medication compliance: excellent compliance Aspirin: no Recent stressors: yes Recurrent headaches: no Visual changes: no Palpitations: no Dyspnea: no Chest pain: no Lower extremity edema: no Dizzy/lightheaded: no  No gout flares. Tolerating medicine well.  DEPRESSION Mood status: exacerbated Satisfied with current treatment?: no Symptom severity: moderate  Duration of current treatment : chronic Side effects: no Medication compliance: excellent compliance Psychotherapy/counseling: no  Previous psychiatric medications: depakote, lamictal, fluoxemine Depressed mood: yes Anxious mood: yes Anhedonia: no Significant weight loss or gain: no Insomnia: no  Fatigue: yes Feelings of worthlessness or guilt: no Impaired concentration/indecisiveness: no Suicidal ideations: no Hopelessness: no Crying spells: no    08/30/2021   11:19 AM 02/09/2021    9:09 AM 02/13/2018    8:54 AM 08/16/2016    8:51 AM 11/11/2015   11:38 AM  Depression screen PHQ 2/9  Decreased Interest 2 1 0 0 2  Down, Depressed, Hopeless 1 1 0 0 1  PHQ - 2 Score 3 2 0 0 3  Altered sleeping 2 2 0  3  Tired, decreased energy 2 2 0  2  Change in appetite '3 3 1  2  '$ Feeling bad or failure about yourself  3 3 0  2   Trouble concentrating 2 2 0  3  Moving slowly or fidgety/restless 0 1 0  1  Suicidal thoughts 1 2 0  2  PHQ-9 Score '16 17 1  18  '$ Difficult doing work/chores Somewhat difficult Somewhat difficult Not difficult at all     KNEE PAIN Duration: about a month Involved knee: left Mechanism of injury: unknown Location:medial Onset: sudden Severity: severe  Quality:  sharp Frequency: with twisting and touching Radiation: no Aggravating factors: twisting, standing  Alleviating factors: not doing the things that aggravate it  Status: worse Treatments attempted: none  Relief with NSAIDs?:  No NSAIDs Taken Weakness with weight bearing or walking: yes Sensation of giving way: yes Locking: yes Popping: no Bruising: no Swelling: no Redness: no Paresthesias/decreased sensation: no Fevers: no  He currently lives with: wife and kids Interim Problems from his last visit: no  Depression Screen done today and results listed below:     08/30/2021   11:19 AM 02/09/2021    9:09 AM 02/13/2018    8:54 AM 08/16/2016    8:51 AM 11/11/2015   11:38 AM  Depression screen PHQ 2/9  Decreased Interest 2 1 0 0 2  Down, Depressed, Hopeless 1 1 0 0 1  PHQ - 2 Score 3 2 0 0 3  Altered sleeping 2 2 0  3  Tired, decreased energy 2 2 0  2  Change in appetite '3 3 1  2  '$ Feeling bad or failure about yourself  3 3 0  2  Trouble concentrating 2 2 0  3  Moving slowly or fidgety/restless 0 1 0  1  Suicidal thoughts 1 2 0  2  PHQ-9 Score '16 17 1  18  '$ Difficult doing work/chores Somewhat difficult Somewhat difficult Not difficult at all      Past Medical History:  Past Medical History:  Diagnosis Date   Abnormal TSH    Anxiety    Bipolar disorder (HCC)    GERD (gastroesophageal reflux disease)    History of mononucleosis    Hyperglycemia    Lymphocytosis    Vitamin B 12 deficiency    Vitamin D deficiency     Surgical History:  Past Surgical History:  Procedure Laterality Date   VASECTOMY       Medications:  Current Outpatient Medications on File Prior to Visit  Medication Sig   allopurinol (ZYLOPRIM) 300 MG tablet TAKE 1 TABLET BY MOUTH EVERY DAY   colchicine 0.6 MG tablet Take 1 tablet (0.6 mg total) by mouth daily. May take an extra tab for flair   divalproex (DEPAKOTE ER) 500 MG 24 hr tablet TAKE 2 TABLETS BY MOUTH EVERY DAY (Patient taking differently: TAKE 4 TABLETS BY MOUTH EVERY DAY)   fluticasone (FLONASE) 50 MCG/ACT nasal spray Place 2 sprays into both nostrils daily.   fluvoxaMINE (LUVOX) 100 MG tablet Take 1 tablet (100 mg total) by mouth at bedtime.   lamoTRIgine (LAMICTAL) 25 MG tablet Take 3 tablets (75 mg total) by mouth daily. (Patient taking differently: Take 100 mg by mouth daily.)   Multiple Vitamin (MULTIVITAMIN) tablet Take 1 tablet by mouth daily.   VITAMIN D, CHOLECALCIFEROL, PO Take 10,000 Units by mouth daily.   loratadine (CLARITIN) 10 MG tablet Take 1 tablet (10 mg total) by mouth daily.   No current facility-administered medications on file prior to visit.    Allergies:  No Known Allergies  Social History:  Social History   Socioeconomic History   Marital status: Married    Spouse name: Not on file   Number of children: Not on file   Years of education: Not on file   Highest education level: Not on file  Occupational History   Not on file  Tobacco Use   Smoking status: Some Days    Types: Cigarettes    Start date: 1998    Last attempt to quit: 12/19/2012    Years since quitting: 8.7   Smokeless tobacco: Former    Types: Chew    Quit date: 2010   Tobacco comments:    would smoke 2 packs every 3 weeks, currently 1 a week or so  Vaping Use   Vaping Use: Former  Substance and Sexual Activity   Alcohol use: Not Currently   Drug use: Yes    Types: Marijuana   Sexual activity: Yes    Birth control/protection: None  Other Topics Concern   Not on file  Social History Narrative   Not on file   Social Determinants of Health    Financial Resource Strain: Not on file  Food Insecurity: Not on file  Transportation Needs: Not on file  Physical Activity: Not on file  Stress: Not on file  Social Connections: Not on file  Intimate Partner Violence: Not on file   Social History   Tobacco Use  Smoking Status Some Days   Types: Cigarettes   Start date: 1998   Last attempt to quit: 12/19/2012   Years since quitting: 8.7  Smokeless Tobacco Former   Types: Chew   Quit date: 2010  Tobacco  Comments   would smoke 2 packs every 3 weeks, currently 1 a week or so   Social History   Substance and Sexual Activity  Alcohol Use Not Currently    Family History:  Family History  Problem Relation Age of Onset   Anxiety disorder Father    COPD Father    Other Sister        substance abuse   Cancer Maternal Grandmother    Hypertension Maternal Grandfather    Cancer Paternal Grandfather     Past medical history, surgical history, medications, allergies, family history and social history reviewed with patient today and changes made to appropriate areas of the chart.   Review of Systems  Constitutional:  Positive for diaphoresis (at night). Negative for chills, fever, malaise/fatigue and weight loss.  HENT:  Positive for congestion. Negative for ear discharge, ear pain, hearing loss, nosebleeds, sinus pain, sore throat and tinnitus.   Eyes: Negative.   Respiratory:  Positive for cough. Negative for hemoptysis, sputum production, shortness of breath, wheezing and stridor.   Cardiovascular:  Positive for chest pain (gas pain/chest pressure, every couple of weeks and then goes away, seems to be in the car- not with exertion). Negative for palpitations, orthopnea, claudication, leg swelling and PND.  Gastrointestinal:  Positive for abdominal pain. Negative for blood in stool, constipation, diarrhea, heartburn, melena, nausea and vomiting.  Genitourinary:  Positive for urgency. Negative for dysuria, flank pain, frequency  and hematuria.       Dribbling  Musculoskeletal:  Positive for joint pain (L knee and R shoulder). Negative for back pain, falls, myalgias and neck pain.  Skin: Negative.        Mole on L shoulder  Neurological:  Positive for tingling (bilateral hands). Negative for dizziness, tremors, sensory change, speech change, focal weakness, seizures, loss of consciousness, weakness and headaches.  Endo/Heme/Allergies: Negative.   Psychiatric/Behavioral:  Positive for depression. Negative for hallucinations, memory loss, substance abuse and suicidal ideas. The patient is nervous/anxious. The patient does not have insomnia.    All other ROS negative except what is listed above and in the HPI.      Objective:    BP 126/77   Pulse 76   Temp 98.4 F (36.9 C) (Oral)   Wt 271 lb 3.2 oz (123 kg)   SpO2 97%   BMI 38.83 kg/m   Wt Readings from Last 3 Encounters:  08/30/21 271 lb 3.2 oz (123 kg)  02/09/21 270 lb 3.2 oz (122.6 kg)  07/23/20 268 lb (121.6 kg)    Physical Exam Vitals and nursing note reviewed.  Constitutional:      General: He is not in acute distress.    Appearance: Normal appearance. He is obese. He is not ill-appearing, toxic-appearing or diaphoretic.  HENT:     Head: Normocephalic and atraumatic.     Right Ear: Tympanic membrane, ear canal and external ear normal. There is no impacted cerumen.     Left Ear: Tympanic membrane, ear canal and external ear normal. There is no impacted cerumen.     Nose: Nose normal. No congestion or rhinorrhea.     Mouth/Throat:     Mouth: Mucous membranes are moist.     Pharynx: Oropharynx is clear. No oropharyngeal exudate or posterior oropharyngeal erythema.  Eyes:     General: No scleral icterus.       Right eye: No discharge.        Left eye: No discharge.     Extraocular Movements: Extraocular  movements intact.     Conjunctiva/sclera: Conjunctivae normal.     Pupils: Pupils are equal, round, and reactive to light.  Neck:     Vascular:  No carotid bruit.  Cardiovascular:     Rate and Rhythm: Normal rate and regular rhythm.     Pulses: Normal pulses.     Heart sounds: No murmur heard.    No friction rub. No gallop.  Pulmonary:     Effort: Pulmonary effort is normal. No respiratory distress.     Breath sounds: Normal breath sounds. No stridor. No wheezing, rhonchi or rales.  Chest:     Chest wall: No tenderness.  Abdominal:     General: Abdomen is flat. Bowel sounds are normal. There is no distension.     Palpations: Abdomen is soft. There is no mass.     Tenderness: There is no abdominal tenderness. There is no right CVA tenderness, left CVA tenderness, guarding or rebound.     Hernia: No hernia is present.  Genitourinary:    Comments: Genital exam deferred with shared decision making Musculoskeletal:        General: No swelling, tenderness, deformity or signs of injury.     Cervical back: Normal range of motion and neck supple. No rigidity. No muscular tenderness.     Right lower leg: No edema.     Left lower leg: No edema.  Lymphadenopathy:     Cervical: No cervical adenopathy.  Skin:    General: Skin is warm and dry.     Capillary Refill: Capillary refill takes less than 2 seconds.     Coloration: Skin is not jaundiced or pale.     Findings: No bruising, erythema, lesion or rash.  Neurological:     General: No focal deficit present.     Mental Status: He is alert and oriented to person, place, and time.     Cranial Nerves: No cranial nerve deficit.     Sensory: No sensory deficit.     Motor: No weakness.     Coordination: Coordination normal.     Gait: Gait normal.     Deep Tendon Reflexes: Reflexes normal.  Psychiatric:        Mood and Affect: Mood normal.        Behavior: Behavior normal.        Thought Content: Thought content normal.        Judgment: Judgment normal.     Results for orders placed or performed in visit on 08/30/21  Microalbumin, Urine Waived  Result Value Ref Range   Microalb,  Ur Waived 10 0 - 19 mg/L   Creatinine, Urine Waived 100 10 - 300 mg/dL   Microalb/Creat Ratio <30 <30 mg/g      Assessment & Plan:   Problem List Items Addressed This Visit       Cardiovascular and Mediastinum   Essential hypertension    Under good control on current regimen. Continue current regimen. Continue to monitor. Call with any concerns. Refills given. Labs drawn today.        Relevant Medications   fenofibrate (TRICOR) 145 MG tablet   lisinopril (ZESTRIL) 10 MG tablet   Other Relevant Orders   Comprehensive metabolic panel   CBC with Differential/Platelet   Microalbumin, Urine Waived (Completed)     Respiratory   Moderate persistent asthma without complication    Under good control on current regimen. Continue current regimen. Continue to monitor. Call with any concerns. Refills given. Labs drawn today.  Relevant Medications   albuterol (VENTOLIN HFA) 108 (90 Base) MCG/ACT inhaler     Musculoskeletal and Integument   Chronic gout of multiple sites    Under good control on current regimen. Continue current regimen. Continue to monitor. Call with any concerns. Refills given. Labs drawn today.       Relevant Orders   Comprehensive metabolic panel   Uric acid     Other   Vitamin D deficiency    Rechecking labs today. Await results. Treat as needed.       Relevant Orders   Comprehensive metabolic panel   VITAMIN D 25 Hydroxy (Vit-D Deficiency, Fractures)   Bipolar 2 disorder [facility])    Following with psychiatry. Due to see them in 2 weeks. Will check labs. Continue to follow with them. Call with any concerns.       Relevant Orders   Comprehensive metabolic panel   Valproic Acid level   Lamotrigine level   Hyperlipidemia    Under good control on current regimen. Continue current regimen. Continue to monitor. Call with any concerns. Refills given. Labs drawn today.       Relevant Medications   fenofibrate (TRICOR) 145 MG tablet   lisinopril  (ZESTRIL) 10 MG tablet   Other Relevant Orders   Comprehensive metabolic panel   Lipid Panel w/o Chol/HDL Ratio   RESOLVED: Gout   Relevant Orders   Comprehensive metabolic panel   Comprehensive metabolic panel   Uric acid   Other Visit Diagnoses     Routine general medical examination at a health care facility    -  Primary   Vaccines updated. Screening labs checked today. Colonoscopy ordered. Continue diet and exercise. Call with any concerns.   Relevant Orders   Hepatitis C Antibody   HIV Antibody (routine testing w rflx)   PSA   Chest pain, unspecified type       Flipped t-wave in V1, changed from last year. Due for follow up with cardiology anyway- will get him back in. Await their input.    Relevant Orders   EKG 12-Lead (Completed)   Ambulatory referral to Cardiology   Acute pain of left knee       Will get him into ortho given the instability. Await their input. Call with any concerns.    Relevant Orders   Ambulatory referral to Orthopedic Surgery   Acute pain of right shoulder       Stretches given today. Likely needs PT. Will get him into ortho. Await theit input.    Relevant Orders   Ambulatory referral to Orthopedic Surgery   Screening for colon cancer       Referral to GI placed today.   Relevant Orders   Ambulatory referral to Gastroenterology        LABORATORY TESTING:  Health maintenance labs ordered today as discussed above.   The natural history of prostate cancer and ongoing controversy regarding screening and potential treatment outcomes of prostate cancer has been discussed with the patient. The meaning of a false positive PSA and a false negative PSA has been discussed. He indicates understanding of the limitations of this screening test and wishes to proceed with screening PSA testing.   IMMUNIZATIONS:   - Tdap: Tetanus vaccination status reviewed: Tdap vaccination indicated and given today. - Influenza: Postponed to flu season - Pneumovax: Not  applicable - Prevnar: Not applicable - COVID: Refused - HPV: Not applicable - Shingrix vaccine: Not applicable  SCREENING: - Colonoscopy: Ordered today  Discussed  with patient purpose of the colonoscopy is to detect colon cancer at curable precancerous or early stages   PATIENT COUNSELING:    Sexuality: Discussed sexually transmitted diseases, partner selection, use of condoms, avoidance of unintended pregnancy  and contraceptive alternatives.   Advised to avoid cigarette smoking.  I discussed with the patient that most people either abstain from alcohol or drink within safe limits (<=14/week and <=4 drinks/occasion for males, <=7/weeks and <= 3 drinks/occasion for females) and that the risk for alcohol disorders and other health effects rises proportionally with the number of drinks per week and how often a drinker exceeds daily limits.  Discussed cessation/primary prevention of drug use and availability of treatment for abuse.   Diet: Encouraged to adjust caloric intake to maintain  or achieve ideal body weight, to reduce intake of dietary saturated fat and total fat, to limit sodium intake by avoiding high sodium foods and not adding table salt, and to maintain adequate dietary potassium and calcium preferably from fresh fruits, vegetables, and low-fat dairy products.    stressed the importance of regular exercise  Injury prevention: Discussed safety belts, safety helmets, smoke detector, smoking near bedding or upholstery.   Dental health: Discussed importance of regular tooth brushing, flossing, and dental visits.   Follow up plan: NEXT PREVENTATIVE PHYSICAL DUE IN 1 YEAR. Return in about 6 months (around 03/01/2022).

## 2021-08-31 ENCOUNTER — Other Ambulatory Visit: Payer: Self-pay | Admitting: Family Medicine

## 2021-08-31 DIAGNOSIS — M10071 Idiopathic gout, right ankle and foot: Secondary | ICD-10-CM

## 2021-08-31 MED ORDER — ALLOPURINOL 300 MG PO TABS: 300.0000 mg | ORAL_TABLET | Freq: Every day | ORAL | 1 refills | Status: DC

## 2021-09-01 ENCOUNTER — Telehealth: Payer: Self-pay

## 2021-09-01 NOTE — Progress Notes (Signed)
Interpreted by me on 08/30/21. NSR at 68bpm with flipped t-waves in V1, new from prior

## 2021-09-01 NOTE — Telephone Encounter (Signed)
CALLED PATIENT NO ANSWER LEFT VOICEMAIL FOR A CALL BACK ? ?

## 2021-09-02 ENCOUNTER — Telehealth: Payer: Self-pay

## 2021-09-02 LAB — COMPREHENSIVE METABOLIC PANEL
ALT: 35 IU/L (ref 0–44)
AST: 24 IU/L (ref 0–40)
Albumin/Globulin Ratio: 2.1 (ref 1.2–2.2)
Albumin: 4.9 g/dL (ref 4.0–5.0)
Alkaline Phosphatase: 40 IU/L — ABNORMAL LOW (ref 44–121)
BUN/Creatinine Ratio: 12 (ref 9–20)
BUN: 14 mg/dL (ref 6–24)
Bilirubin Total: 0.2 mg/dL (ref 0.0–1.2)
CO2: 24 mmol/L (ref 20–29)
Calcium: 9.7 mg/dL (ref 8.7–10.2)
Chloride: 100 mmol/L (ref 96–106)
Creatinine, Ser: 1.2 mg/dL (ref 0.76–1.27)
Globulin, Total: 2.3 g/dL (ref 1.5–4.5)
Glucose: 74 mg/dL (ref 70–99)
Potassium: 4.5 mmol/L (ref 3.5–5.2)
Sodium: 140 mmol/L (ref 134–144)
Total Protein: 7.2 g/dL (ref 6.0–8.5)
eGFR: 76 mL/min/{1.73_m2} (ref >59)

## 2021-09-02 LAB — LIPID PANEL W/O CHOL/HDL RATIO
Cholesterol, Total: 262 mg/dL — ABNORMAL HIGH (ref 100–199)
HDL: 34 mg/dL — ABNORMAL LOW
LDL Chol Calc (NIH): 180 mg/dL — ABNORMAL HIGH (ref 0–99)
Triglycerides: 249 mg/dL — ABNORMAL HIGH (ref 0–149)
VLDL Cholesterol Cal: 48 mg/dL — ABNORMAL HIGH (ref 5–40)

## 2021-09-02 LAB — LAMOTRIGINE LEVEL: Lamotrigine Lvl: 4.8 ug/mL (ref 2.0–20.0)

## 2021-09-02 LAB — CBC WITH DIFFERENTIAL/PLATELET
Basophils Absolute: 0.1 10*3/uL (ref 0.0–0.2)
Basos: 1 %
EOS (ABSOLUTE): 0.4 10*3/uL (ref 0.0–0.4)
Eos: 4 %
Hematocrit: 48.5 % (ref 37.5–51.0)
Hemoglobin: 16.3 g/dL (ref 13.0–17.7)
Immature Grans (Abs): 0.1 10*3/uL (ref 0.0–0.1)
Immature Granulocytes: 1 %
Lymphocytes Absolute: 4.7 10*3/uL — ABNORMAL HIGH (ref 0.7–3.1)
Lymphs: 51 %
MCH: 28.9 pg (ref 26.6–33.0)
MCHC: 33.6 g/dL (ref 31.5–35.7)
MCV: 86 fL (ref 79–97)
Monocytes Absolute: 0.6 10*3/uL (ref 0.1–0.9)
Monocytes: 7 %
Neutrophils Absolute: 3.2 10*3/uL (ref 1.4–7.0)
Neutrophils: 36 %
Platelets: 225 10*3/uL (ref 150–450)
RBC: 5.64 x10E6/uL (ref 4.14–5.80)
RDW: 12.8 % (ref 11.6–15.4)
WBC: 9 10*3/uL (ref 3.4–10.8)

## 2021-09-02 LAB — URIC ACID: Uric Acid: 5.1 mg/dL (ref 3.8–8.4)

## 2021-09-02 LAB — VALPROIC ACID LEVEL: Valproic Acid Lvl: 90 ug/mL (ref 50–100)

## 2021-09-02 LAB — HEPATITIS C ANTIBODY: Hep C Virus Ab: NONREACTIVE

## 2021-09-02 LAB — VITAMIN D 25 HYDROXY (VIT D DEFICIENCY, FRACTURES): Vit D, 25-Hydroxy: 60.4 ng/mL (ref 30.0–100.0)

## 2021-09-02 LAB — PSA: Prostate Specific Ag, Serum: 0.4 ng/mL (ref 0.0–4.0)

## 2021-09-02 LAB — HIV ANTIBODY (ROUTINE TESTING W REFLEX): HIV Screen 4th Generation wRfx: NONREACTIVE

## 2021-09-02 NOTE — Telephone Encounter (Signed)
CALLED PATIENT NO ANSWER LEFT VOICEMAIL FOR A CALL BACK °Letter sent °

## 2021-09-03 MED ORDER — ATORVASTATIN CALCIUM 40 MG PO TABS: 40.0000 mg | ORAL_TABLET | Freq: Every day | ORAL | 1 refills | Status: DC

## 2021-09-03 NOTE — Addendum Note (Signed)
Addended by: Valerie Roys on: 09/03/2021 04:39 PM   Modules accepted: Orders

## 2021-09-15 ENCOUNTER — Encounter: Payer: Self-pay | Admitting: Psychiatry

## 2021-09-15 ENCOUNTER — Ambulatory Visit: Payer: 59 | Admitting: Psychiatry

## 2021-09-15 DIAGNOSIS — G4733 Obstructive sleep apnea (adult) (pediatric): Secondary | ICD-10-CM

## 2021-09-15 DIAGNOSIS — F422 Mixed obsessional thoughts and acts: Secondary | ICD-10-CM

## 2021-09-15 DIAGNOSIS — F3181 Bipolar II disorder: Secondary | ICD-10-CM

## 2021-09-15 DIAGNOSIS — Z9989 Dependence on other enabling machines and devices: Secondary | ICD-10-CM

## 2021-09-15 DIAGNOSIS — R69 Illness, unspecified: Secondary | ICD-10-CM | POA: Diagnosis not present

## 2021-09-15 DIAGNOSIS — F411 Generalized anxiety disorder: Secondary | ICD-10-CM

## 2021-09-15 MED ORDER — LITHIUM CARBONATE ER 300 MG PO TBCR: EXTENDED_RELEASE_TABLET | ORAL | 1 refills | Status: DC
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
## 2021-10-03 ENCOUNTER — Other Ambulatory Visit: Payer: Self-pay | Admitting: Psychiatry

## 2021-10-03 DIAGNOSIS — F411 Generalized anxiety disorder: Secondary | ICD-10-CM

## 2021-10-03 DIAGNOSIS — F422 Mixed obsessional thoughts and acts: Secondary | ICD-10-CM

## 2021-10-07 ENCOUNTER — Other Ambulatory Visit: Payer: Self-pay | Admitting: Psychiatry

## 2021-10-07 DIAGNOSIS — F3181 Bipolar II disorder: Secondary | ICD-10-CM

## 2021-11-06 ENCOUNTER — Other Ambulatory Visit: Payer: Self-pay | Admitting: Psychiatry

## 2021-11-06 DIAGNOSIS — F3181 Bipolar II disorder: Secondary | ICD-10-CM

## 2021-11-11 ENCOUNTER — Other Ambulatory Visit: Payer: Self-pay | Admitting: Family Medicine

## 2021-11-11 NOTE — Telephone Encounter (Signed)
Requested medication (s) are due for refill today - no  Requested medication (s) are on the active medication list -yes  Future visit scheduled -no  Last refill: 08/30/21 #90 1RF  Notes to clinic: Pharmacy notification: PA may be required, max limits exceeded  Requested Prescriptions  Pending Prescriptions Disp Refills   omeprazole (PRILOSEC) 40 MG capsule [Pharmacy Med Name: OMEPRAZOLE DR 40 MG CAPSULE] 90 capsule 1    Sig: TAKE 1 [place]     Gastroenterology: Proton Pump Inhibitors Passed - 11/11/2021  7:48 AM      Passed - Valid encounter within last 12 months    Recent Outpatient Visits           2 months ago Routine general medical examination at a health care facility   [facility], Megan P, DO   9 months ago Hyperlipidemia, unspecified hyperlipidemia type   [facility] Vigg, Avanti, MD   1 year ago Chest pain, unspecified type   [facility], Lauren A, NP   1 year ago Acute idiopathic gout of right foot   [place], [place] T, NP   2 years ago Chronic cough   [employer], [place], DO                 Requested Prescriptions  Pending Prescriptions Disp Refills   omeprazole (PRILOSEC) 40 MG capsule [Pharmacy Med Name: OMEPRAZOLE DR 40 MG CAPSULE] 90 capsule 1    Sig: TAKE 1 [place]     Gastroenterology: Proton Pump Inhibitors Passed - 11/11/2021  7:48 AM      Passed - Valid encounter within last 12 months    Recent Outpatient Visits           2 months ago Routine general medical examination at a health care facility   [facility], Megan P, DO   9 months ago Hyperlipidemia, unspecified hyperlipidemia type   [place] Vigg, Avanti, MD   1 year ago Chest pain, unspecified type   [facility], Lauren A, NP   1 year ago Acute idiopathic gout of right foot   [place], [place] T, NP   2 years ago Chronic cough   [place], [place], DO

## 2021-11-12 NOTE — Telephone Encounter (Signed)
PA initiated via CoverMyMeds for Omeprazole '40MG'$  capsules KEY: [id number] Waiting on determination

## 2021-11-12 NOTE — Telephone Encounter (Signed)
Called pharmacy to advise PA has been approved, per pharmacist medication has went through. Patient will be notified.

## 2021-11-18 ENCOUNTER — Ambulatory Visit: Payer: 59 | Admitting: Psychiatry

## 2021-11-18 ENCOUNTER — Encounter: Payer: Self-pay | Admitting: Psychiatry

## 2021-11-18 DIAGNOSIS — F3181 Bipolar II disorder: Secondary | ICD-10-CM

## 2021-11-18 DIAGNOSIS — F411 Generalized anxiety disorder: Secondary | ICD-10-CM

## 2021-11-18 DIAGNOSIS — G4733 Obstructive sleep apnea (adult) (pediatric): Secondary | ICD-10-CM | POA: Diagnosis not present

## 2021-11-18 DIAGNOSIS — R69 Illness, unspecified: Secondary | ICD-10-CM | POA: Diagnosis not present

## 2021-11-18 DIAGNOSIS — F422 Mixed obsessional thoughts and acts: Secondary | ICD-10-CM

## 2021-11-18 DIAGNOSIS — Z9989 Dependence on other enabling machines and devices: Secondary | ICD-10-CM | POA: Diagnosis not present

## 2021-11-18 MED ORDER — LAMOTRIGINE 100 MG PO TABS: 100.0000 mg | ORAL_TABLET | Freq: Every day | ORAL | 0 refills | Status: DC
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
## 2021-12-02 ENCOUNTER — Other Ambulatory Visit: Payer: Self-pay | Admitting: Psychiatry

## 2021-12-02 DIAGNOSIS — F3181 Bipolar II disorder: Secondary | ICD-10-CM

## 2021-12-30 ENCOUNTER — Other Ambulatory Visit: Payer: Self-pay | Admitting: Psychiatry

## 2021-12-30 DIAGNOSIS — F3181 Bipolar II disorder: Secondary | ICD-10-CM

## 2021-12-30 NOTE — Telephone Encounter (Signed)
Last note says to take up to 2000 mg, what is he taking. LVM to RC.

## 2022-01-02 ENCOUNTER — Other Ambulatory Visit: Payer: Self-pay | Admitting: Psychiatry

## 2022-01-02 DIAGNOSIS — F411 Generalized anxiety disorder: Secondary | ICD-10-CM

## 2022-01-02 DIAGNOSIS — F422 Mixed obsessional thoughts and acts: Secondary | ICD-10-CM

## 2022-01-17 ENCOUNTER — Other Ambulatory Visit: Payer: Self-pay | Admitting: Psychiatry

## 2022-01-17 DIAGNOSIS — F411 Generalized anxiety disorder: Secondary | ICD-10-CM

## 2022-01-17 DIAGNOSIS — F422 Mixed obsessional thoughts and acts: Secondary | ICD-10-CM

## 2022-02-10 ENCOUNTER — Other Ambulatory Visit: Payer: Self-pay | Admitting: Psychiatry

## 2022-02-10 DIAGNOSIS — F422 Mixed obsessional thoughts and acts: Secondary | ICD-10-CM

## 2022-02-10 DIAGNOSIS — F3181 Bipolar II disorder: Secondary | ICD-10-CM

## 2022-02-16 ENCOUNTER — Telehealth: Payer: Self-pay | Admitting: Psychiatry

## 2022-02-16 NOTE — Telephone Encounter (Signed)
Will he need to restart at a lower dose?

## 2022-02-16 NOTE — Telephone Encounter (Signed)
Pt called and said that he needs a refill on his lamictal 100 mg. Pharmacy is [facility] in [place]. He has been out a week and doesn't know if he hast to start on a lower dose or not. {Please call him at [phone]

## 2022-02-17 ENCOUNTER — Other Ambulatory Visit: Payer: Self-pay | Admitting: Psychiatry

## 2022-02-17 DIAGNOSIS — F422 Mixed obsessional thoughts and acts: Secondary | ICD-10-CM

## 2022-02-17 DIAGNOSIS — F3181 Bipolar II disorder: Secondary | ICD-10-CM

## 2022-02-17 MED ORDER — LAMOTRIGINE 100 MG PO TABS: 100.0000 mg | ORAL_TABLET | Freq: Every day | ORAL | 1 refills | Status: DC

## 2022-02-17 MED ORDER — LAMOTRIGINE 25 MG PO TABS: ORAL_TABLET | ORAL | 0 refills | Status: DC

## 2022-02-17 NOTE — Telephone Encounter (Signed)
LVM with info

## 2022-02-17 NOTE — Telephone Encounter (Signed)
Yes he will need to restart at a lower dose to minimize the rash risk of restarting the med.  I'll sent in RX

## 2022-03-01 ENCOUNTER — Other Ambulatory Visit: Payer: Self-pay | Admitting: Psychiatry

## 2022-03-01 DIAGNOSIS — F3181 Bipolar II disorder: Secondary | ICD-10-CM

## 2022-03-08 ENCOUNTER — Other Ambulatory Visit: Payer: Self-pay | Admitting: Family Medicine

## 2022-03-08 NOTE — Telephone Encounter (Signed)
Requested Prescriptions  Pending Prescriptions Disp Refills   lisinopril (ZESTRIL) 10 MG tablet [Pharmacy Med Name: LISINOPRIL 10 MG TABLET] 30 tablet 5    Sig: TAKE 1 TABLET BY MOUTH EVERY DAY     Cardiovascular:  ACE Inhibitors Failed - 03/08/2022 12:42 AM      Failed - Cr in normal range and within 180 days    Creatinine, Ser  Date Value Ref Range Status  08/30/2021 1.20 0.76 - 1.27 mg/dL Final         Failed - K in normal range and within 180 days    Potassium  Date Value Ref Range Status  08/30/2021 4.5 3.5 - 5.2 mmol/L Final         Failed - Valid encounter within last 6 months    Recent Outpatient Visits           6 months ago Routine general medical examination at a health care facility   [facility], [place], DO   1 year ago Hyperlipidemia, unspecified hyperlipidemia type   [facility] V[place], A[place], MD   1 year ago Chest pain, unspecified type   [facility], Lauren A, NP   2 years ago Acute idiopathic gout of right foot   [place], [place] T, NP   3 years ago Chronic cough   [place], [place], DO              Passed - Patient is not pregnant      Passed - Last BP in normal range    BP Readings from Last 1 Encounters:  08/30/21 126/77          omeprazole (PRILOSEC) 40 MG capsule [Pharmacy Med Name: OMEPRAZOLE DR 40 MG CAPSULE] 30 capsule 5    Sig: TAKE 1 [place]     Gastroenterology: Proton Pump Inhibitors Passed - 03/08/2022 12:42 AM      Passed - Valid encounter within last 12 months    Recent Outpatient Visits           6 months ago Routine general medical examination at a health care facility   [facility], [place], DO   1 year ago Hyperlipidemia, unspecified hyperlipidemia type   [place] Vigg, Avanti, MD   1 year ago Chest pain, unspecified type   [facility], Lauren  A, NP   2 years ago Acute idiopathic gout of right foot   [place], [place] T, NP   3 years ago Chronic cough   [place], [place], DO

## 2022-03-08 NOTE — Telephone Encounter (Signed)
Courtesy refill. Called patient to schedule appt no answer. LVMTCB. Requested Prescriptions  Pending Prescriptions Disp Refills   lisinopril (ZESTRIL) 10 MG tablet [Pharmacy Med Name: LISINOPRIL 10 MG TABLET] 30 tablet 0    Sig: TAKE 1 TABLET BY MOUTH EVERY DAY     Cardiovascular:  ACE Inhibitors Failed - 03/08/2022 12:42 AM      Failed - Cr in normal range and within 180 days    Creatinine, Ser  Date Value Ref Range Status  08/30/2021 1.20 0.76 - 1.27 mg/dL Final         Failed - K in normal range and within 180 days    Potassium  Date Value Ref Range Status  08/30/2021 4.5 3.5 - 5.2 mmol/L Final         Failed - Valid encounter within last 6 months    Recent Outpatient Visits           6 months ago Routine general medical examination at a health care facility   [facility], [place], DO   1 year ago Hyperlipidemia, unspecified hyperlipidemia type   [place] Vigg, Avanti, MD   1 year ago Chest pain, unspecified type   [facility], Lauren A, NP   2 years ago Acute idiopathic gout of right foot   [place], [place] T, NP   3 years ago Chronic cough   [place], [place], DO              Passed - Patient is not pregnant      Passed - Last BP in normal range    BP Readings from Last 1 Encounters:  08/30/21 126/77         Signed Prescriptions Disp Refills   omeprazole (PRILOSEC) 40 MG capsule 30 capsule 5    Sig: TAKE 1 [place] DAY     Gastroenterology: Proton Pump Inhibitors Passed - 03/08/2022 12:42 AM      Passed - Valid encounter within last 12 months    Recent Outpatient Visits           6 months ago Routine general medical examination at a health care facility   [facility], [place], DO   1 year ago Hyperlipidemia, unspecified hyperlipidemia type   [place] Vigg, Avanti, MD   1 year ago Chest pain,  unspecified type   [facility], Lauren A, NP   2 years ago Acute idiopathic gout of right foot   [place], [place] T, NP   3 years ago Chronic cough   [place], [place], DO

## 2022-03-08 NOTE — Telephone Encounter (Signed)
Called patient to schedule appt for medication refills. No answer, LVMTCB [phone].

## 2022-03-11 ENCOUNTER — Other Ambulatory Visit: Payer: Self-pay | Admitting: Psychiatry

## 2022-03-11 DIAGNOSIS — F422 Mixed obsessional thoughts and acts: Secondary | ICD-10-CM

## 2022-03-11 DIAGNOSIS — F3181 Bipolar II disorder: Secondary | ICD-10-CM

## 2022-03-11 MED ORDER — LAMOTRIGINE 100 MG PO TABS: 100.0000 mg | ORAL_TABLET | Freq: Every day | ORAL | 0 refills | Status: DC

## 2022-03-24 ENCOUNTER — Encounter: Payer: Self-pay | Admitting: Psychiatry

## 2022-03-24 ENCOUNTER — Ambulatory Visit: Payer: 59 | Admitting: Psychiatry

## 2022-03-24 DIAGNOSIS — F422 Mixed obsessional thoughts and acts: Secondary | ICD-10-CM | POA: Diagnosis not present

## 2022-03-24 DIAGNOSIS — F3181 Bipolar II disorder: Secondary | ICD-10-CM | POA: Diagnosis not present

## 2022-03-24 DIAGNOSIS — F411 Generalized anxiety disorder: Secondary | ICD-10-CM

## 2022-03-24 DIAGNOSIS — R69 Illness, unspecified: Secondary | ICD-10-CM | POA: Diagnosis not present

## 2022-03-24 DIAGNOSIS — G4733 Obstructive sleep apnea (adult) (pediatric): Secondary | ICD-10-CM

## 2022-03-24 MED ORDER — DIVALPROEX SODIUM ER 500 MG PO TB24: 1500.0000 mg | ORAL_TABLET | Freq: Every day | ORAL | 1 refills | Status: DC

## 2022-03-24 MED ORDER — FLUVOXAMINE MALEATE 100 MG PO TABS: 100.0000 mg | ORAL_TABLET | Freq: Every day | ORAL | 1 refills | Status: DC

## 2022-03-24 MED ORDER — LITHIUM CARBONATE ER 300 MG PO TBCR: EXTENDED_RELEASE_TABLET | ORAL | 1 refills | Status: DC

## 2022-03-24 MED ORDER — LAMOTRIGINE 100 MG PO TABS: 100.0000 mg | ORAL_TABLET | Freq: Every day | ORAL | 1 refills | Status: DC
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
## 2022-04-06 ENCOUNTER — Other Ambulatory Visit: Payer: Self-pay | Admitting: Family Medicine

## 2022-04-06 NOTE — Telephone Encounter (Signed)
Requested Prescriptions  Pending Prescriptions Disp Refills   fenofibrate (TRICOR) 145 MG tablet [Pharmacy Med Name: FENOFIBRATE 145 MG TABLET] 30 tablet 5    Sig: TAKE 1 TABLET BY MOUTH EVERY DAY     Cardiovascular:  Antilipid - Fibric Acid Derivatives Failed - 04/06/2022  1:25 AM      Failed - Lipid Panel in normal range within the last 12 months    Cholesterol, Total  Date Value Ref Range Status  08/30/2021 262 (H) 100 - 199 mg/dL Final   LDL Chol Calc (NIH)  Date Value Ref Range Status  08/30/2021 180 (H) 0 - 99 mg/dL Final   HDL  Date Value Ref Range Status  08/30/2021 34 (L) >39 mg/dL Final   Triglycerides  Date Value Ref Range Status  08/30/2021 249 (H) 0 - 149 mg/dL Final         Passed - ALT in normal range and within 360 days    ALT  Date Value Ref Range Status  08/30/2021 35 0 - 44 IU/L Final         Passed - AST in normal range and within 360 days    AST  Date Value Ref Range Status  08/30/2021 24 0 - 40 IU/L Final         Passed - Cr in normal range and within 360 days    Creatinine, Ser  Date Value Ref Range Status  08/30/2021 1.20 0.76 - 1.27 mg/dL Final         Passed - HGB in normal range and within 360 days    Hemoglobin  Date Value Ref Range Status  08/30/2021 16.3 13.0 - 17.7 g/dL Final         Passed - HCT in normal range and within 360 days    Hematocrit  Date Value Ref Range Status  08/30/2021 48.5 37.5 - 51.0 % Final         Passed - PLT in normal range and within 360 days    Platelets  Date Value Ref Range Status  08/30/2021 225 150 - 450 x10E3/uL Final         Passed - WBC in normal range and within 360 days    WBC  Date Value Ref Range Status  08/30/2021 9.0 3.4 - 10.8 x10E3/uL Final         Passed - eGFR is 30 or above and within 360 days    GFR calc Af Amer  Date Value Ref Range Status  12/26/2019 98 >59 mL/min/1.73 Final    Comment:    **[facility] currently reports eGFR in compliance with the current**    recommendations of the [employer]. [facility] will   update reporting as new guidelines are published from the NKF-ASN   Task force.    GFR calc non Af Amer  Date Value Ref Range Status  12/26/2019 85 >59 mL/min/1.73 Final   eGFR  Date Value Ref Range Status  08/30/2021 76 >59 mL/min/1.73 Final         Passed - Valid encounter within last 12 months    Recent Outpatient Visits           7 months ago Routine general medical examination at a health care facility   [facility], [place], DO   1 year ago Hyperlipidemia, unspecified hyperlipidemia type   [facility] Vigg, A[place], MD   1 year ago Chest pain, unspecified type   [facility], Lauren A,  NP   2 years ago Acute idiopathic gout of right foot   [place], [place]z T, NP   3 years ago Chronic cough   [place], [place], [place]

## 2022-04-08 ENCOUNTER — Other Ambulatory Visit: Payer: Self-pay | Admitting: Family Medicine

## 2022-04-11 NOTE — Telephone Encounter (Signed)
Requested medication (s) are due for refill today: No  Requested medication (s) are on the active medication list: Yes  Last refill:  03/08/22  Future visit scheduled:   Notes to clinic:  Pharmacy requests 90 day supply.    Requested Prescriptions  Pending Prescriptions Disp Refills   lisinopril (ZESTRIL) 10 MG tablet [Pharmacy Med Name: LISINOPRIL 10 MG TABLET] 90 tablet 1    Sig: TAKE 1 TABLET BY MOUTH EVERY DAY     Cardiovascular:  ACE Inhibitors Failed - 04/08/2022  2:34 PM      Failed - Cr in normal range and within 180 days    Creatinine, Ser  Date Value Ref Range Status  08/30/2021 1.20 0.76 - 1.27 mg/dL Final         Failed - K in normal range and within 180 days    Potassium  Date Value Ref Range Status  08/30/2021 4.5 3.5 - 5.2 mmol/L Final         Failed - Valid encounter within last 6 months    Recent Outpatient Visits           7 months ago Routine general medical examination at a health care facility   [place], [place], DO   1 year ago Hyperlipidemia, unspecified hyperlipidemia type   [place] Vigg, Avanti, MD   1 year ago Chest pain, unspecified type   [place], NP   2 years ago Acute idiopathic gout of right foot   [place], [place] T, NP   3 years ago Chronic cough   [place], [place], [place]              Passed - Patient is not pregnant      Passed - Last BP in normal range    BP Readings from Last 1 Encounters:  08/30/21 126/77

## 2022-04-12 ENCOUNTER — Other Ambulatory Visit: Payer: Self-pay | Admitting: Psychiatry

## 2022-04-12 ENCOUNTER — Other Ambulatory Visit: Payer: Self-pay | Admitting: Family Medicine

## 2022-04-12 DIAGNOSIS — F3181 Bipolar II disorder: Secondary | ICD-10-CM

## 2022-04-12 DIAGNOSIS — F422 Mixed obsessional thoughts and acts: Secondary | ICD-10-CM

## 2022-04-12 NOTE — Telephone Encounter (Signed)
Requested Prescriptions  Pending Prescriptions Disp Refills   atorvastatin (LIPITOR) 40 MG tablet [Pharmacy Med Name: ATORVASTATIN 40 MG TABLET] 90 tablet 1    Sig: TAKE 1 TABLET BY MOUTH EVERY DAY     Cardiovascular:  Antilipid - Statins Failed - 04/12/2022  2:30 PM      Failed - Lipid Panel in normal range within the last 12 months    Cholesterol, Total  Date Value Ref Range Status  08/30/2021 262 (H) 100 - 199 mg/dL Final   LDL Chol Calc (NIH)  Date Value Ref Range Status  08/30/2021 180 (H) 0 - 99 mg/dL Final   HDL  Date Value Ref Range Status  08/30/2021 34 (L) >39 mg/dL Final   Triglycerides  Date Value Ref Range Status  08/30/2021 249 (H) 0 - 149 mg/dL Final         Passed - Patient is not pregnant      Passed - Valid encounter within last 12 months    Recent Outpatient Visits           7 months ago Routine general medical examination at a health care facility   [place], [place] P, DO   1 year ago Hyperlipidemia, unspecified hyperlipidemia type   [place] Vigg, Avanti, MD   1 year ago Chest pain, unspecified type   [place], NP   2 years ago Acute idiopathic gout of right foot   [place], [place] T, NP   3 years ago Chronic cough   [place], [place], DO       Future Appointments             In 1 week Wynetta Emery, Barb Merino, DO [place], [facility]

## 2022-04-19 ENCOUNTER — Encounter: Payer: Self-pay | Admitting: Family Medicine

## 2022-04-19 ENCOUNTER — Ambulatory Visit: Payer: 59 | Admitting: Family Medicine

## 2022-04-19 VITALS — BP 120/84 | HR 75 | Temp 98.4°F | Wt 278.0 lb

## 2022-04-19 DIAGNOSIS — K219 Gastro-esophageal reflux disease without esophagitis: Secondary | ICD-10-CM | POA: Diagnosis not present

## 2022-04-19 DIAGNOSIS — Z1211 Encounter for screening for malignant neoplasm of colon: Secondary | ICD-10-CM

## 2022-04-19 DIAGNOSIS — E559 Vitamin D deficiency, unspecified: Secondary | ICD-10-CM

## 2022-04-19 DIAGNOSIS — J4540 Moderate persistent asthma, uncomplicated: Secondary | ICD-10-CM | POA: Diagnosis not present

## 2022-04-19 DIAGNOSIS — E785 Hyperlipidemia, unspecified: Secondary | ICD-10-CM

## 2022-04-19 DIAGNOSIS — F3181 Bipolar II disorder: Secondary | ICD-10-CM

## 2022-04-19 DIAGNOSIS — M1A09X0 Idiopathic chronic gout, multiple sites, without tophus (tophi): Secondary | ICD-10-CM

## 2022-04-19 DIAGNOSIS — I10 Essential (primary) hypertension: Secondary | ICD-10-CM

## 2022-04-19 DIAGNOSIS — R69 Illness, unspecified: Secondary | ICD-10-CM | POA: Diagnosis not present

## 2022-04-19 MED ORDER — ALBUTEROL SULFATE HFA 108 (90 BASE) MCG/ACT IN AERS: 2.0000 | INHALATION_SPRAY | Freq: Four times a day (QID) | RESPIRATORY_TRACT | 5 refills | Status: DC | PRN

## 2022-04-19 MED ORDER — FENOFIBRATE 145 MG PO TABS: 145.0000 mg | ORAL_TABLET | Freq: Every day | ORAL | 1 refills | Status: DC

## 2022-04-19 MED ORDER — ATORVASTATIN CALCIUM 40 MG PO TABS: 40.0000 mg | ORAL_TABLET | Freq: Every day | ORAL | 1 refills | Status: DC

## 2022-04-19 MED ORDER — OMEPRAZOLE 40 MG PO CPDR: 40.0000 mg | DELAYED_RELEASE_CAPSULE | Freq: Every day | ORAL | 1 refills | Status: DC

## 2022-04-19 MED ORDER — LISINOPRIL 10 MG PO TABS: 10.0000 mg | ORAL_TABLET | Freq: Every day | ORAL | 1 refills | Status: DC

## 2022-04-19 MED ORDER — ALLOPURINOL 300 MG PO TABS: 300.0000 mg | ORAL_TABLET | Freq: Every day | ORAL | 1 refills | Status: DC

## 2022-04-19 MED ORDER — COLCHICINE 0.6 MG PO TABS: 0.6000 mg | ORAL_TABLET | Freq: Every day | ORAL | 1 refills | Status: DC

## 2022-04-19 NOTE — Assessment & Plan Note (Signed)
Doing well. Continue to follow with psych. Call with any concerns.

## 2022-04-19 NOTE — Assessment & Plan Note (Signed)
Under good control on current regimen. Continue current regimen. Continue to monitor. Call with any concerns. Refills given. Labs drawn today.

## 2022-04-19 NOTE — Assessment & Plan Note (Signed)
Under good control on current regimen. Continue current regimen. Continue to monitor. Call with any concerns. Refills given. Labs drawn today.   

## 2022-04-19 NOTE — Progress Notes (Signed)
BP 120/84   Pulse 75   Temp 98.4 F (36.9 C) (Oral)   Wt 278 lb (126.1 kg)   SpO2 96%   BMI 39.80 kg/m    Subjective:    Patient ID: Rodney Peters, male    DOB: [birth year], 47 y.o.   MRN: [id number]  HPI: Rodney Peters is a 47 y.o. male  Chief Complaint  Patient presents with   Hypertension   Has been having discomfort where he feels like he has a pain in his chest where it feels like something is coming up into his chest- concern for hiatal hernia.   No gout flares. Tolerating medicine well.  HYPERTENSION / HYPERLIPIDEMIA Satisfied with current treatment? yes Duration of hypertension: chronic BP monitoring frequency: not checking BP medication side effects: no Past BP meds: lisinopril Duration of hyperlipidemia: chronic Cholesterol medication side effects: no Cholesterol supplements: none Past cholesterol medications: atorvastatin Medication compliance: excellent compliance Aspirin: no Recent stressors: no Recurrent headaches: no Visual changes: no Palpitations: no Dyspnea: no Chest pain: no Lower extremity edema: no Dizzy/lightheaded: no  Relevant past medical, surgical, family and social history reviewed and updated as indicated. Interim medical history since our last visit reviewed. Allergies and medications reviewed and updated.  Review of Systems  Constitutional: Negative.   Respiratory: Negative.    Cardiovascular:  Positive for chest pain. Negative for palpitations and leg swelling.  Gastrointestinal: Negative.   Musculoskeletal: Negative.   Neurological: Negative.   Psychiatric/Behavioral: Negative.      Per HPI unless specifically indicated above     Objective:    BP 120/84   Pulse 75   Temp 98.4 F (36.9 C) (Oral)   Wt 278 lb (126.1 kg)   SpO2 96%   BMI 39.80 kg/m   Wt Readings from Last 3 Encounters:  04/19/22 278 lb (126.1 kg)  08/30/21 271 lb 3.2 oz (123 kg)  02/09/21 270 lb 3.2 oz (122.6 kg)    Physical Exam Vitals and  nursing note reviewed.  Constitutional:      General: He is not in acute distress.    Appearance: Normal appearance. He is obese. He is not ill-appearing, toxic-appearing or diaphoretic.  HENT:     Head: Normocephalic and atraumatic.     Right Ear: External ear normal.     Left Ear: External ear normal.     Nose: Nose normal.     Mouth/Throat:     Mouth: Mucous membranes are moist.     Pharynx: Oropharynx is clear.  Eyes:     General: No scleral icterus.       Right eye: No discharge.        Left eye: No discharge.     Extraocular Movements: Extraocular movements intact.     Conjunctiva/sclera: Conjunctivae normal.     Pupils: Pupils are equal, round, and reactive to light.  Cardiovascular:     Rate and Rhythm: Normal rate and regular rhythm.     Pulses: Normal pulses.     Heart sounds: Normal heart sounds. No murmur heard.    No friction rub. No gallop.  Pulmonary:     Effort: Pulmonary effort is normal. No respiratory distress.     Breath sounds: Normal breath sounds. No stridor. No wheezing, rhonchi or rales.  Chest:     Chest wall: No tenderness.  Musculoskeletal:        General: Normal range of motion.     Cervical back: Normal range of motion and neck supple.  Skin:    General: Skin is warm and dry.     Capillary Refill: Capillary refill takes less than 2 seconds.     Coloration: Skin is not jaundiced or pale.     Findings: No bruising, erythema, lesion or rash.  Neurological:     General: No focal deficit present.     Mental Status: He is alert and oriented to person, place, and time. Mental status is at baseline.  Psychiatric:        Mood and Affect: Mood normal.        Behavior: Behavior normal.        Thought Content: Thought content normal.        Judgment: Judgment normal.     Results for orders placed or performed in visit on 08/30/21  Comprehensive metabolic panel  Result Value Ref Range   Glucose 74 70 - 99 mg/dL   BUN 14 6 - 24 mg/dL   Creatinine,  Ser 1.20 0.76 - 1.27 mg/dL   eGFR 76 >59 mL/min/1.73   BUN/Creatinine Ratio 12 9 - 20   Sodium 140 134 - 144 mmol/L   Potassium 4.5 3.5 - 5.2 mmol/L   Chloride 100 96 - 106 mmol/L   CO2 24 20 - 29 mmol/L   Calcium 9.7 8.7 - 10.2 mg/dL   Total Protein 7.2 6.0 - 8.5 g/dL   Albumin 4.9 4.0 - 5.0 g/dL   Globulin, Total 2.3 1.5 - 4.5 g/dL   Albumin/Globulin Ratio 2.1 1.2 - 2.2   Bilirubin Total 0.2 0.0 - 1.2 mg/dL   Alkaline Phosphatase 40 (L) 44 - 121 IU/L   AST 24 0 - 40 IU/L   ALT 35 0 - 44 IU/L  Uric acid  Result Value Ref Range   Uric Acid 5.1 3.8 - 8.4 mg/dL  CBC with Differential/Platelet  Result Value Ref Range   WBC 9.0 3.4 - 10.8 x10E3/uL   RBC 5.64 4.14 - 5.80 x10E6/uL   Hemoglobin 16.3 13.0 - 17.7 g/dL   Hematocrit 48.5 37.5 - 51.0 %   MCV 86 79 - 97 fL   MCH 28.9 26.6 - 33.0 pg   MCHC 33.6 31.5 - 35.7 g/dL   RDW 12.8 11.6 - 15.4 %   Platelets 225 150 - 450 x10E3/uL   Neutrophils 36 Not Estab. %   Lymphs 51 Not Estab. %   Monocytes 7 Not Estab. %   Eos 4 Not Estab. %   Basos 1 Not Estab. %   Neutrophils Absolute 3.2 1.4 - 7.0 x10E3/uL   Lymphocytes Absolute 4.7 (H) 0.7 - 3.1 x10E3/uL   Monocytes Absolute 0.6 0.1 - 0.9 x10E3/uL   EOS (ABSOLUTE) 0.4 0.0 - 0.4 x10E3/uL   Basophils Absolute 0.1 0.0 - 0.2 x10E3/uL   Immature Granulocytes 1 Not Estab. %   Immature Grans (Abs) 0.1 0.0 - 0.1 x10E3/uL  Lipid Panel w/o Chol/HDL Ratio  Result Value Ref Range   Cholesterol, Total 262 (H) 100 - 199 mg/dL   Triglycerides 249 (H) 0 - 149 mg/dL   HDL 34 (L) >39 mg/dL   VLDL Cholesterol Cal 48 (H) 5 - 40 mg/dL   LDL Chol Calc (NIH) 180 (H) 0 - 99 mg/dL  Valproic Acid level  Result Value Ref Range   Valproic Acid Lvl 90 50 - 100 ug/mL  Lamotrigine level  Result Value Ref Range   Lamotrigine Lvl 4.8 2.0 - 20.0 ug/mL  VITAMIN D 25 Hydroxy (Vit-D Deficiency, Fractures)  Result Value Ref  Range   Vit D, 25-Hydroxy 60.4 30.0 - 100.0 ng/mL  Microalbumin, Urine Waived  Result  Value Ref Range   Microalb, Ur Waived 10 0 - 19 mg/L   Creatinine, Urine Waived 100 10 - 300 mg/dL   Microalb/Creat Ratio <30 <30 mg/g  Hepatitis C Antibody  Result Value Ref Range   Hep C Virus Ab Non Reactive Non Reactive  HIV Antibody (routine testing w rflx)  Result Value Ref Range   HIV Screen 4th Generation wRfx Non Reactive Non Reactive  PSA  Result Value Ref Range   Prostate Specific Ag, Serum 0.4 0.0 - 4.0 ng/mL      Assessment & Plan:   Problem List Items Addressed This Visit       Cardiovascular and Mediastinum   Essential hypertension    Under good control on current regimen. Continue current regimen. Continue to monitor. Call with any concerns. Refills given. Labs drawn today.        Relevant Medications   atorvastatin (LIPITOR) 40 MG tablet   fenofibrate (TRICOR) 145 MG tablet   lisinopril (ZESTRIL) 10 MG tablet   Other Relevant Orders   CBC with Differential/Platelet   Comprehensive metabolic panel     Respiratory   Moderate persistent asthma without complication    Under good control on current regimen. Continue current regimen. Continue to monitor. Call with any concerns. Refills given. Labs drawn today.       Relevant Medications   albuterol (VENTOLIN HFA) 108 (90 Base) MCG/ACT inhaler     Digestive   Gastroesophageal reflux disease without esophagitis    Stable. Concern for hiatal hernia. Referral to GI placed today. Call with any concerns.       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 Continue current regimen. Continue to monitor. Call with any concerns. Refills given. Labs drawn today.       Relevant Medications   allopurinol (ZYLOPRIM) 300 MG tablet   colchicine 0.6 MG tablet   Other Relevant Orders   CBC with Differential/Platelet    Uric acid     Other   Vitamin D deficiency    Rechecking labs today. Await results. Treat as needed.       Relevant Orders   CBC with Differential/Platelet   VITAMIN D 25 Hydroxy (Vit-D Deficiency, Fractures)   Bipolar 2 disorder ([place])    Doing well. Continue to follow with psych. Call with any concerns.       Hyperlipidemia    Under good control on current regimen. Continue current regimen. Continue to monitor. Call with any concerns. Refills given. Labs drawn today.       Relevant Medications   atorvastatin (LIPITOR) 40 MG tablet   fenofibrate (TRICOR) 145 MG tablet   lisinopril (ZESTRIL) 10 MG tablet   Other Relevant Orders   CBC with Differential/Platelet   Comprehensive metabolic panel   Lipid Panel w/o Chol/HDL Ratio   Other Visit Diagnoses     Screening for colon cancer       Referral to GI placed today.   Relevant Orders   Ambulatory referral to Gastroenterology        Follow up plan: Return in about 6 months (around 10/18/2022).

## 2022-04-19 NOTE — Assessment & Plan Note (Signed)
Stable. Concern for hiatal hernia. Referral to GI placed today. Call with any concerns.

## 2022-04-19 NOTE — Assessment & Plan Note (Signed)
Rechecking labs today. Await results. Treat as needed.  °

## 2022-04-20 LAB — LIPID PANEL W/O CHOL/HDL RATIO
Cholesterol, Total: 242 mg/dL — ABNORMAL HIGH (ref 100–199)
HDL: 28 mg/dL — ABNORMAL LOW (ref >39)
LDL Chol Calc (NIH): 138 mg/dL — ABNORMAL HIGH (ref 0–99)
Triglycerides: 413 mg/dL — ABNORMAL HIGH (ref 0–149)
VLDL Cholesterol Cal: 76 mg/dL — ABNORMAL HIGH (ref 5–40)

## 2022-04-20 LAB — CBC WITH DIFFERENTIAL/PLATELET
Basophils Absolute: 0.1 10*3/uL (ref 0.0–0.2)
Basos: 1 %
EOS (ABSOLUTE): 0.2 10*3/uL (ref 0.0–0.4)
Eos: 2 %
Hematocrit: 49.7 % (ref 37.5–51.0)
Hemoglobin: 16.5 g/dL (ref 13.0–17.7)
Immature Grans (Abs): 0 10*3/uL (ref 0.0–0.1)
Immature Granulocytes: 0 %
Lymphocytes Absolute: 4.8 10*3/uL — ABNORMAL HIGH (ref 0.7–3.1)
Lymphs: 45 %
MCH: 28.6 pg (ref 26.6–33.0)
MCHC: 33.2 g/dL (ref 31.5–35.7)
MCV: 86 fL (ref 79–97)
Monocytes Absolute: 0.6 10*3/uL (ref 0.1–0.9)
Monocytes: 6 %
Neutrophils Absolute: 4.9 10*3/uL (ref 1.4–7.0)
Neutrophils: 46 %
Platelets: 230 10*3/uL (ref 150–450)
RBC: 5.77 x10E6/uL (ref 4.14–5.80)
RDW: 13 % (ref 11.6–15.4)
WBC: 10.6 10*3/uL (ref 3.4–10.8)

## 2022-04-20 LAB — COMPREHENSIVE METABOLIC PANEL
ALT: 38 IU/L (ref 0–44)
AST: 26 IU/L (ref 0–40)
Albumin/Globulin Ratio: 2.3 — ABNORMAL HIGH (ref 1.2–2.2)
Albumin: 5.1 g/dL (ref 4.1–5.1)
Alkaline Phosphatase: 42 IU/L — ABNORMAL LOW (ref 44–121)
BUN/Creatinine Ratio: 12 (ref 9–20)
BUN: 16 mg/dL (ref 6–24)
Bilirubin Total: 0.4 mg/dL (ref 0.0–1.2)
CO2: 22 mmol/L (ref 20–29)
Calcium: 9.7 mg/dL (ref 8.7–10.2)
Chloride: 101 mmol/L (ref 96–106)
Creatinine, Ser: 1.3 mg/dL — ABNORMAL HIGH (ref 0.76–1.27)
Globulin, Total: 2.2 g/dL (ref 1.5–4.5)
Glucose: 88 mg/dL (ref 70–99)
Potassium: 4.6 mmol/L (ref 3.5–5.2)
Sodium: 139 mmol/L (ref 134–144)
Total Protein: 7.3 g/dL (ref 6.0–8.5)
eGFR: 69 mL/min/{1.73_m2} (ref >59)

## 2022-04-20 LAB — URIC ACID: Uric Acid: 5.9 mg/dL (ref 3.8–8.4)

## 2022-04-20 LAB — VITAMIN D 25 HYDROXY (VIT D DEFICIENCY, FRACTURES): Vit D, 25-Hydroxy: 42.2 ng/mL (ref 30.0–100.0)

## 2022-05-18 ENCOUNTER — Encounter: Payer: Self-pay | Admitting: Gastroenterology

## 2022-05-18 ENCOUNTER — Ambulatory Visit (INDEPENDENT_AMBULATORY_CARE_PROVIDER_SITE_OTHER): Payer: 59 | Admitting: Gastroenterology

## 2022-05-18 ENCOUNTER — Other Ambulatory Visit: Payer: Self-pay

## 2022-05-18 VITALS — BP 135/89 | HR 73 | Temp 99.0°F | Ht 70.08 in | Wt 272.5 lb

## 2022-05-18 DIAGNOSIS — K219 Gastro-esophageal reflux disease without esophagitis: Secondary | ICD-10-CM

## 2022-05-18 DIAGNOSIS — R1319 Other dysphagia: Secondary | ICD-10-CM

## 2022-05-18 DIAGNOSIS — Z1211 Encounter for screening for malignant neoplasm of colon: Secondary | ICD-10-CM

## 2022-05-18 MED ORDER — NA SULFATE-K SULFATE-MG SULF 17.5-3.13-1.6 GM/177ML PO SOLN: 354.0000 mL | Freq: Once | ORAL | 0 refills | Status: AC

## 2022-05-18 NOTE — Progress Notes (Signed)
Cephas Darby, MD [address]  [place]  [place], [place] [address]  Main: [phone]  Fax: [phone]    Gastroenterology Consultation  Referring Provider:     Valerie Roys, DO Primary Care Physician:  Valerie Roys, DO Primary Gastroenterologist:  Dr. Cephas Darby Reason for Consultation: Chronic GERD, colon cancer screening        HPI:   Rodney Peters is a 47 y.o. male referred by Dr. Wynetta Emery, Barb Merino, DO  for consultation & management of chronic GERD.  Patient reports having history of reflux symptoms including heartburn, regurgitation experiencing for the last 2 years, has been maintained on omeprazole 40 mg once a day before breakfast.  He reports that the symptoms have resolved.  However, he reports intermittent episodes of substernal pain secondary to bulge in that area and he has to push eating side.  This usually occurs when he bends over.  He also reports intermittent episodes of difficulty swallowing especially hard foods.  He does consume red meat about 2-3 times a week.  Tries to stay active, does not exercise regularly.  His PCP has referred her to GI to evaluate for hiatal hernia and also to undergo colonoscopy for screening  Smokes tobacco Occasional alcohol use  NSAIDs: None  Antiplts/Anticoagulants/Anti thrombotics: None  GI Procedures: None Patient denies any family history of GI malignancy  Past Medical History:  Diagnosis Date   Abnormal TSH    Anxiety    Bipolar disorder ([facility])    GERD (gastroesophageal reflux disease)    History of mononucleosis    Hyperglycemia    Lymphocytosis    Vitamin B 12 deficiency    Vitamin D deficiency     Past Surgical History:  Procedure Laterality Date   VASECTOMY       Current Outpatient Medications:    albuterol (VENTOLIN HFA) 108 (90 Base) MCG/ACT inhaler, Inhale 2 puffs into the lungs every 6 (six) hours as needed for wheezing or shortness of breath., Disp: 18 g, Rfl: 5    allopurinol (ZYLOPRIM) 300 MG tablet, Take 1 tablet (300 mg total) by mouth daily., Disp: 90 tablet, Rfl: 1   atorvastatin (LIPITOR) 40 MG tablet, Take 1 tablet (40 mg total) by mouth daily., Disp: 90 tablet, Rfl: 1   colchicine 0.6 MG tablet, Take 1 tablet (0.6 mg total) by mouth daily. May take an extra tab for flair, Disp: 90 tablet, Rfl: 1   divalproex (DEPAKOTE ER) 500 MG 24 hr tablet, Take 3 tablets (1,500 mg total) by mouth daily., Disp: 270 tablet, Rfl: 1   fenofibrate (TRICOR) 145 MG tablet, Take 1 tablet (145 mg total) by mouth daily., Disp: 90 tablet, Rfl: 1   fluticasone (FLONASE) 50 MCG/ACT nasal spray, Place 2 sprays into both nostrils daily., Disp: , Rfl:    fluvoxaMINE (LUVOX) 100 MG tablet, Take 1 tablet (100 mg total) by mouth at bedtime., Disp: 90 tablet, Rfl: 1   lamoTRIgine (LAMICTAL) 100 MG tablet, Take 1 tablet (100 mg total) by mouth daily., Disp: 90 tablet, Rfl: 1   lisinopril (ZESTRIL) 10 MG tablet, Take 1 tablet (10 mg total) by mouth daily., Disp: 90 tablet, Rfl: 1   lithium carbonate (LITHOBID) 300 MG ER tablet, TAKE 2 TABLETS BY MOUTH NIGHTLY, Disp: 540 tablet, Rfl: 1   Multiple Vitamin (MULTIVITAMIN) tablet, Take 1 tablet by mouth daily., Disp: , Rfl:    Na Sulfate-K Sulfate-Mg Sulf 17.5-3.13-1.6 GM/177ML SOLN, Take 354 mLs by mouth once  for 1 dose., Disp: 354 mL, Rfl: 0   omeprazole (PRILOSEC) 40 MG capsule, Take 1 capsule (40 mg total) by mouth daily., Disp: 90 capsule, Rfl: 1   VITAMIN D, CHOLECALCIFEROL, PO, Take 10,000 Units by mouth daily. (Patient not taking: Reported on 05/18/2022), Disp: , Rfl:    Family History  Problem Relation Age of Onset   Anxiety disorder Father    COPD Father    Other Sister        substance abuse   Cancer Maternal Grandmother    Hypertension Maternal Grandfather    Cancer Paternal Grandfather      Social History   Tobacco Use   Smoking status: Some Days    Types: Cigarettes    Start date: 1998    Last attempt to quit:  12/19/2012    Years since quitting: 9.4   Smokeless tobacco: Former    Types: Chew    Quit date: 2010   Tobacco comments:    would smoke 2 packs every 3 weeks, currently 1 a week or so  Vaping Use   Vaping Use: Former  Substance Use Topics   Alcohol use: Not Currently   Drug use: Yes    Types: Marijuana    Allergies as of 05/18/2022   (No Known Allergies)    Review of Systems:    All systems reviewed and negative except where noted in HPI.   Physical Exam:  BP 135/89 (BP Location: Left Arm, Patient Position: Sitting, Cuff Size: Normal)   Pulse 73   Temp 99 F (37.2 C) (Oral)   Ht 5' 10.08" (1.78 m)   Wt 272 lb 8 oz (123.6 kg)   BMI 39.01 kg/m  No LMP for male patient.  General:   Alert,  Well-developed, well-nourished, pleasant and cooperative in NAD Head:  Normocephalic and atraumatic. Eyes:  Sclera clear, no icterus.   Conjunctiva pink. Ears:  Normal auditory acuity. Nose:  No deformity, discharge, or lesions. Mouth:  No deformity or lesions,oropharynx pink & moist. Neck:  Supple; no masses or thyromegaly. Lungs:  Respirations even and unlabored.  Clear throughout to auscultation.   No wheezes, crackles, or rhonchi. No acute distress. Heart:  Regular rate and rhythm; no murmurs, clicks, rubs, or gallops. Abdomen:  Normal bowel sounds. Soft, non-tender and non-distended without masses, hepatosplenomegaly or hernias noted.  No guarding or rebound tenderness.   Rectal: Not performed Msk:  Symmetrical without gross deformities. Good, equal movement & strength bilaterally. Pulses:  Normal pulses noted. Extremities:  No clubbing or edema.  No cyanosis. Neurologic:  Alert and oriented x3;  grossly normal neurologically. Skin:  Intact without significant lesions or rashes. No jaundice. Psych:  Alert and cooperative. Normal mood and affect.  Imaging Studies: No abdominal imaging  Assessment and Plan:   Rodney Peters is a 46 y.o. pleasant Caucasian male with obesity,  BMI 39, chronic GERD, gout, bipolar is seen in consultation to evaluate for hiatal hernia and to discuss about colonoscopy for colon cancer screening  Chronic GERD with difficulty swallowing and possible hiatal hernia Labs revealed mild peripheral absolute eosinophilia Recommend EGD with proximal and distal esophageal biopsies and evaluate for the presence of hernia Discussed about antireflux lifestyle, information provided Omeprazole 40 mg once a day before breakfast  Colon cancer screening Discussed about screening colonoscopy and patient is agreeable   Follow up based on the above workup   Cephas Darby, MD

## 2022-05-18 NOTE — Patient Instructions (Signed)
Gastroesophageal Reflux Disease, Adult  Gastroesophageal reflux (GER) happens when acid from the stomach flows up into the tube that connects the mouth and the stomach (esophagus). Normally, food travels down the esophagus and stays in the stomach to be digested. With GER, food and stomach acid sometimes move back up into the esophagus. You may have a disease called gastroesophageal reflux disease (GERD) if the reflux: Happens often. Causes frequent or very bad symptoms. Causes problems such as damage to the esophagus. When this happens, the esophagus becomes sore and swollen. Over time, GERD can make small holes (ulcers) in the lining of the esophagus. What are the causes? This condition is caused by a problem with the muscle between the esophagus and the stomach. When this muscle is weak or not normal, it does not close properly to keep food and acid from coming back up from the stomach. The muscle can be weak because of: Tobacco use. Pregnancy. Having a certain type of hernia (hiatal hernia). Alcohol use. Certain foods and drinks, such as coffee, chocolate, onions, and peppermint. What increases the risk? Being overweight. Having a disease that affects your connective tissue. Taking NSAIDs, such a ibuprofen. What are the signs or symptoms? Heartburn. Difficult or painful swallowing. The feeling of having a lump in the throat. A bitter taste in the mouth. Bad breath. Having a lot of saliva. Having an upset or bloated stomach. Burping. Chest pain. Different conditions can cause chest pain. Make sure you see your doctor if you have chest pain. Shortness of breath or wheezing. A long-term cough or a cough at night. Wearing away of the surface of teeth (tooth enamel). Weight loss. How is this treated? Making changes to your diet. Taking medicine. Having surgery. Treatment will depend on how bad your symptoms are. Follow these instructions at home: Eating and drinking  Follow a  diet as told by your doctor. You may need to avoid foods and drinks such as: Coffee and tea, with or without caffeine. Drinks that contain alcohol. Energy drinks and sports drinks. Bubbly (carbonated) drinks or sodas. Chocolate and cocoa. Peppermint and mint flavorings. Garlic and onions. Horseradish. Spicy and acidic foods. These include peppers, chili powder, curry powder, vinegar, hot sauces, and BBQ sauce. Citrus fruit juices and citrus fruits, such as oranges, lemons, and limes. Tomato-based foods. These include red sauce, chili, salsa, and pizza with red sauce. Fried and fatty foods. These include donuts, french fries, potato chips, and high-fat dressings. High-fat meats. These include hot dogs, rib eye steak, sausage, ham, and bacon. High-fat dairy items, such as whole milk, butter, and cream cheese. Eat small meals often. Avoid eating large meals. Avoid drinking large amounts of liquid with your meals. Avoid eating meals during the 2-3 hours before bedtime. Avoid lying down right after you eat. Do not exercise right after you eat. Lifestyle  Do not smoke or use any products that contain nicotine or tobacco. If you need help quitting, ask your doctor. Try to lower your stress. If you need help doing this, ask your doctor. If you are overweight, lose an amount of weight that is healthy for you. Ask your doctor about a safe weight loss goal. General instructions Pay attention to any changes in your symptoms. Take over-the-counter and prescription medicines only as told by your doctor. Do not take aspirin, ibuprofen, or other NSAIDs unless your doctor says it is okay. Wear loose clothes. Do not wear anything tight around your waist. Raise (elevate) the head of your bed about  6 inches (15 cm). You may need to use a wedge to do this. Avoid bending over if this makes your symptoms worse. Keep all follow-up visits. Contact a doctor if: You have new symptoms. You lose weight and you  do not know why. You have trouble swallowing or it hurts to swallow. You have wheezing or a cough that keeps happening. You have a hoarse voice. Your symptoms do not get better with treatment. Get help right away if: You have sudden pain in your arms, neck, jaw, teeth, or back. You suddenly feel sweaty, dizzy, or light-headed. You have chest pain or shortness of breath. You vomit and the vomit is green, yellow, or black, or it looks like blood or coffee grounds. You faint. Your poop (stool) is red, bloody, or black. You cannot swallow, drink, or eat. These symptoms may represent a serious problem that is an emergency. Do not wait to see if the symptoms will go away. Get medical help right away. Call your local emergency services (911 in the [place] Do not drive yourself to the hospital. Summary If a person has gastroesophageal reflux disease (GERD), food and stomach acid move back up into the esophagus and cause symptoms or problems such as damage to the esophagus. Treatment will depend on how bad your symptoms are. Follow a diet as told by your doctor. Take all medicines only as told by your doctor. This information is not intended to replace advice given to you by your health care provider. Make sure you discuss any questions you have with your health care provider. Document Revised: 09/16/2019 Document Reviewed: 09/16/2019 Elsevier Patient Education  [employer].

## 2022-05-20 ENCOUNTER — Encounter: Payer: Self-pay | Admitting: Gastroenterology

## 2022-05-20 ENCOUNTER — Encounter
Admission: RE | Disposition: A | Discharge status: Home / Self Care | Payer: Self-pay | Source: Home / Self Care | Attending: Gastroenterology

## 2022-05-20 ENCOUNTER — Ambulatory Visit: Payer: 59 | Admitting: Certified Registered"

## 2022-05-20 ENCOUNTER — Ambulatory Visit
Admission: RE | Admit: 2022-05-20 | Discharge: 2022-05-20 | Disposition: A | Discharge status: Home / Self Care | Payer: 59 | Attending: Gastroenterology | Admitting: Gastroenterology

## 2022-05-20 DIAGNOSIS — K635 Polyp of colon: Secondary | ICD-10-CM

## 2022-05-20 DIAGNOSIS — I10 Essential (primary) hypertension: Secondary | ICD-10-CM | POA: Diagnosis not present

## 2022-05-20 DIAGNOSIS — R1310 Dysphagia, unspecified: Secondary | ICD-10-CM | POA: Insufficient documentation

## 2022-05-20 DIAGNOSIS — K449 Diaphragmatic hernia without obstruction or gangrene: Secondary | ICD-10-CM | POA: Insufficient documentation

## 2022-05-20 DIAGNOSIS — D123 Benign neoplasm of transverse colon: Secondary | ICD-10-CM | POA: Diagnosis not present

## 2022-05-20 DIAGNOSIS — F17210 Nicotine dependence, cigarettes, uncomplicated: Secondary | ICD-10-CM | POA: Diagnosis not present

## 2022-05-20 DIAGNOSIS — Z1211 Encounter for screening for malignant neoplasm of colon: Secondary | ICD-10-CM

## 2022-05-20 DIAGNOSIS — R1319 Other dysphagia: Secondary | ICD-10-CM

## 2022-05-20 DIAGNOSIS — K219 Gastro-esophageal reflux disease without esophagitis: Secondary | ICD-10-CM

## 2022-05-20 HISTORY — PX: ESOPHAGOGASTRODUODENOSCOPY (EGD) WITH PROPOFOL: SHX5813

## 2022-05-20 HISTORY — PX: COLONOSCOPY WITH PROPOFOL: SHX5780

## 2022-05-20 SURGERY — COLONOSCOPY WITH PROPOFOL
Anesthesia: General

## 2022-05-20 MED ORDER — SODIUM CHLORIDE 0.9 % IV SOLN: INTRAVENOUS | Status: DC

## 2022-05-20 MED ORDER — DEXMEDETOMIDINE HCL IN NACL 80 MCG/20ML IV SOLN
INTRAVENOUS | Status: AC
  Filled 2022-05-20: qty 20

## 2022-05-20 MED ORDER — LIDOCAINE HCL (CARDIAC) PF 100 MG/5ML IV SOSY
PREFILLED_SYRINGE | INTRAVENOUS | Status: DC | PRN
  Administered 2022-05-20: 100 mg via INTRAVENOUS

## 2022-05-20 MED ORDER — PROPOFOL 10 MG/ML IV BOLUS
INTRAVENOUS | Status: AC
  Filled 2022-05-20: qty 40

## 2022-05-20 MED ORDER — PROPOFOL 10 MG/ML IV BOLUS
INTRAVENOUS | Status: AC
  Filled 2022-05-20: qty 20

## 2022-05-20 MED ORDER — LIDOCAINE HCL (PF) 2 % IJ SOLN
INTRAMUSCULAR | Status: AC
  Filled 2022-05-20: qty 5

## 2022-05-20 MED ORDER — PROPOFOL 10 MG/ML IV BOLUS
INTRAVENOUS | Status: DC | PRN
  Administered 2022-05-20: 50 mg via INTRAVENOUS
  Administered 2022-05-20: 30 mg via INTRAVENOUS
  Administered 2022-05-20: 150 mg via INTRAVENOUS
  Administered 2022-05-20: 30 mg via INTRAVENOUS
  Administered 2022-05-20: 20 mg via INTRAVENOUS
  Administered 2022-05-20: 40 mg via INTRAVENOUS
  Administered 2022-05-20: 120 ug/kg/min via INTRAVENOUS

## 2022-05-20 MED ORDER — DEXMEDETOMIDINE HCL IN NACL 80 MCG/20ML IV SOLN
INTRAVENOUS | Status: DC | PRN
  Administered 2022-05-20 (×2): 8 ug via BUCCAL

## 2022-05-20 NOTE — Op Note (Signed)
[facility] Patient Name: Rodney Peters Procedure Date: 05/20/2022 9:44 AM MRN: [id number] Account #: [id number] Date of Birth: Jan 05, 1976 Admit Type: Outpatient Age: 47 Room: [facility] ENDO ROOM 2 Gender: Male Note Status: Finalized Instrument Name: Jasper Riling [id number] Procedure:             Colonoscopy Indications:           Screening for colorectal malignant neoplasm, This is                         the patient's first colonoscopy Providers:             Lin Landsman MD, MD Referring MD:          Valerie Roys (Referring MD) Medicines:             General Anesthesia Complications:         No immediate complications. Estimated blood loss: None. Procedure:             Pre-Anesthesia Assessment:                        - Prior to the procedure, a History and Physical was                         performed, and patient medications and allergies were                         reviewed. The patient is competent. The risks and                         benefits of the procedure and the sedation options and                         risks were discussed with the patient. All questions                         were answered and informed consent was obtained.                         Patient identification and proposed procedure were                         verified by the physician, the nurse, the                         anesthesiologist, the anesthetist and the technician                         in the pre-procedure area in the procedure room in the                         endoscopy suite. Mental Status Examination: alert and                         oriented. Airway Examination: normal oropharyngeal                         airway and neck mobility. Respiratory Examination:  clear to auscultation. CV Examination: normal.                         Prophylactic Antibiotics: The patient does not require                         prophylactic  antibiotics. Prior Anticoagulants: The                         patient has taken no anticoagulant or antiplatelet                         agents. ASA Grade Assessment: II - A patient with mild                         systemic disease. After reviewing the risks and                         benefits, the patient was deemed in satisfactory                         condition to undergo the procedure. The anesthesia                         plan was to use general anesthesia. Immediately prior                         to administration of medications, the patient was                         re-assessed for adequacy to receive sedatives. The                         heart rate, respiratory rate, oxygen saturations,                         blood pressure, adequacy of pulmonary ventilation, and                         response to care were monitored throughout the                         procedure. The physical status of the patient was                         re-assessed after the procedure.                        After obtaining informed consent, the colonoscope was                         passed under direct vision. Throughout the procedure,                         the patient's blood pressure, pulse, and oxygen                         saturations were monitored continuously. The  Colonoscope was introduced through the anus and                         advanced to the the cecum, identified by appendiceal                         orifice and ileocecal valve. The colonoscopy was                         performed without difficulty. The patient tolerated                         the procedure well. The quality of the bowel                         preparation was evaluated using the BBPS [facility] Bowel                         Preparation Scale) with scores of: Right Colon = 3,                         Transverse Colon = 3 and Left Colon = 3 (entire mucosa                         seen  well with no residual staining, small fragments                         of stool or opaque liquid). The total BBPS score                         equals 9. The ileocecal valve, appendiceal orifice,                         and rectum were photographed. Findings:      The perianal and digital rectal examinations were normal. Pertinent       negatives include normal sphincter tone and no palpable rectal lesions.      An 8 mm polyp was found in the transverse colon. The polyp was sessile.       The polyp was removed with a cold snare. Resection and retrieval were       complete. Estimated blood loss: none.      The retroflexed view of the distal rectum and anal verge was normal and       showed no anal or rectal abnormalities.      The exam was otherwise without abnormality. Impression:            - One 8 mm polyp in the transverse colon, removed with                         a cold snare. Resected and retrieved.                        - The distal rectum and anal verge are normal on                         retroflexion view.                        -  The examination was otherwise normal. Recommendation:        - Discharge patient to home (with escort).                        - Resume previous diet today.                        - Continue present medications.                        - Await pathology results.                        - Repeat colonoscopy in 5 years for surveillance. Procedure Code(s):     --- Professional ---                        [phone], Colonoscopy, flexible; with removal of                         tumor(s), polyp(s), or other lesion(s) by snare                         technique Diagnosis Code(s):     --- Professional ---                        Z12.11, Encounter for screening for malignant neoplasm                         of colon                        D12.3, Benign neoplasm of transverse colon (hepatic                         flexure or splenic flexure) CPT copyright 2022  American Medical Association. All rights reserved. The codes documented in this report are preliminary and upon coder review may  be revised to meet current compliance requirements. Dr. Ulyess Mort Lin Landsman MD, MD 05/20/2022 10:23:38 AM This report has been signed electronically. Number of Addenda: 0 Note Initiated On: 05/20/2022 9:44 AM Scope Withdrawal Time: 0 hours 11 minutes 44 seconds  Total Procedure Duration: 0 hours 15 minutes 0 seconds  Estimated Blood Loss:  Estimated blood loss: none.      [facility]

## 2022-05-20 NOTE — H&P (Signed)
Cephas Darby, MD [address]  [place]  [place], [place] [address]  Main: [phone]  Fax: [phone] Pager: [phone]  Primary Care Physician:  Valerie Roys, DO Primary Gastroenterologist:  Dr. Cephas Darby  Pre-Procedure History & Physical: HPI:  Rodney Peters is a 47 y.o. male is here for an endoscopy and colonoscopy.   Past Medical History:  Diagnosis Date   Abnormal TSH    Anxiety    Bipolar disorder (HCC)    GERD (gastroesophageal reflux disease)    History of mononucleosis    Hyperglycemia    Lymphocytosis    Vitamin B 12 deficiency    Vitamin D deficiency     Past Surgical History:  Procedure Laterality Date   VASECTOMY      Prior to Admission medications   Medication Sig Start Date End Date Taking? Authorizing Provider  albuterol (VENTOLIN HFA) 108 (90 Base) MCG/ACT inhaler Inhale 2 puffs into the lungs every 6 (six) hours as needed for wheezing or shortness of breath. 04/19/22  Yes Johnson, Megan P, DO  allopurinol (ZYLOPRIM) 300 MG tablet Take 1 tablet (300 mg total) by mouth daily. 04/19/22  Yes Johnson, Megan P, DO  colchicine 0.6 MG tablet Take 1 tablet (0.6 mg total) by mouth daily. May take an extra tab for flair 04/19/22  Yes Johnson, Megan P, DO  fluticasone (FLONASE) 50 MCG/ACT nasal spray Place 2 sprays into both nostrils daily.   Yes [provider]  VITAMIN D, CHOLECALCIFEROL, PO Take 10,000 Units by mouth daily.   Yes [provider]  atorvastatin (LIPITOR) 40 MG tablet Take 1 tablet (40 mg total) by mouth daily. 04/19/22   Johnson, Megan P, DO  divalproex (DEPAKOTE ER) 500 MG 24 hr tablet Take 3 tablets (1,500 mg total) by mouth daily. 03/24/22   Cottle, Billey Co., MD  fenofibrate (TRICOR) 145 MG tablet Take 1 tablet (145 mg total) by mouth daily. 04/19/22   Johnson, Megan P, DO  fluvoxaMINE (LUVOX) 100 MG tablet Take 1 tablet (100 mg total) by mouth at bedtime. 03/24/22   Cottle, Billey Co., MD  lamoTRIgine  (LAMICTAL) 100 MG tablet Take 1 tablet (100 mg total) by mouth daily. 03/24/22   Cottle, Billey Co., MD  lisinopril (ZESTRIL) 10 MG tablet Take 1 tablet (10 mg total) by mouth daily. 04/19/22   Park Liter P, DO  lithium carbonate (LITHOBID) 300 MG ER tablet TAKE 2 TABLETS BY MOUTH NIGHTLY 03/24/22   Cottle, Billey Co., MD  Multiple Vitamin (MULTIVITAMIN) tablet Take 1 tablet by mouth daily.    [provider]  omeprazole (PRILOSEC) 40 MG capsule Take 1 capsule (40 mg total) by mouth daily. 04/19/22   Valerie Roys, DO    Allergies as of 05/18/2022   (No Known Allergies)    Family History  Problem Relation Age of Onset   Anxiety disorder Father    COPD Father    Other Sister        substance abuse   Cancer Maternal Grandmother    Hypertension Maternal Grandfather    Cancer Paternal Grandfather     Social History   Socioeconomic History   Marital status: Married    Spouse name: Not on file   Number of children: Not on file   Years of education: Not on file   Highest education level: Not on file  Occupational History   Not on file  Tobacco Use   Smoking status: Some  Days    Types: Cigarettes    Start date: 1998    Last attempt to quit: 12/19/2012    Years since quitting: 9.4   Smokeless tobacco: Former    Types: Chew    Quit date: 2010   Tobacco comments:    would smoke 2 packs every 3 weeks, currently 1 a week or so  Vaping Use   Vaping Use: Former  Substance and Sexual Activity   Alcohol use: Not Currently   Drug use: Yes    Types: Marijuana   Sexual activity: Yes    Birth control/protection: None  Other Topics Concern   Not on file  Social History Narrative   Not on file   Social Determinants of Health   Financial Resource Strain: Not on file  Food Insecurity: Not on file  Transportation Needs: Not on file  Physical Activity: Not on file  Stress: Not on file  Social Connections: Not on file  Intimate Partner Violence: Not on file    Review  of Systems: See HPI, otherwise negative ROS  Physical Exam: BP 133/89   Pulse 77   Temp (!) 97.1 F (36.2 C) (Temporal)   Resp 18   Ht '5\' 10"'$  (1.778 m)   Wt 120.2 kg   SpO2 98%   BMI 38.02 kg/m  General:   Alert,  pleasant and cooperative in NAD Head:  Normocephalic and atraumatic. Neck:  Supple; no masses or thyromegaly. Lungs:  Clear throughout to auscultation.    Heart:  Regular rate and rhythm. Abdomen:  Soft, nontender and nondistended. Normal bowel sounds, without guarding, and without rebound.   Neurologic:  Alert and  oriented x4;  grossly normal neurologically.  Impression/Plan: Rodney Peters is here for an endoscopy and colonoscopy to be performed for Chronic GERD with difficulty swallowing and possible hiatal hernia, Colon cancer screening    Risks, benefits, limitations, and alternatives regarding  endoscopy and colonoscopy have been reviewed with the patient.  Questions have been answered.  All parties agreeable.   Sherri Sear, MD  05/20/2022, 8:56 AM

## 2022-05-20 NOTE — Anesthesia Postprocedure Evaluation (Signed)
Anesthesia Post Note  Patient: Rodney Peters  Procedure(s) Performed: COLONOSCOPY WITH PROPOFOL ESOPHAGOGASTRODUODENOSCOPY (EGD) WITH PROPOFOL  Patient location during evaluation: PACU Anesthesia Type: General Level of consciousness: awake and alert Pain management: pain level controlled Vital Signs Assessment: post-procedure vital signs reviewed and stable Respiratory status: spontaneous breathing, nonlabored ventilation and respiratory function stable Cardiovascular status: blood pressure returned to baseline and stable Postop Assessment: no apparent nausea or vomiting Anesthetic complications: no   No notable events documented.   Last Vitals:  Vitals:   05/20/22 1040 05/20/22 1050  BP: 128/84 117/85  Pulse: 72 65  Resp: 13 15  Temp:    SpO2: 97% 99%    Last Pain:  Vitals:   05/20/22 1050  TempSrc:   PainSc: 0-No pain                 [place] Ouch

## 2022-05-20 NOTE — Anesthesia Preprocedure Evaluation (Addendum)
Anesthesia Evaluation  Patient identified by MRN, date of birth, ID band Patient awake    Reviewed: Allergy & Precautions, H&P , NPO status , Patient's Chart, lab work & pertinent test results  Airway Mallampati: II  TM Distance: >3 FB Neck ROM: full    Dental no notable dental hx.    Pulmonary asthma , Current Smoker and Patient abstained from smoking.   Pulmonary exam normal        Cardiovascular hypertension, Pt. on medications Normal cardiovascular exam     Neuro/Psych  PSYCHIATRIC DISORDERS       Neuromuscular disease (neuropathy)    GI/Hepatic Neg liver ROS,GERD  Medicated,,  Endo/Other  negative endocrine ROS    Renal/GU negative Renal ROS  negative genitourinary   Musculoskeletal   Abdominal  (+) + obese  Peds  Hematology negative hematology ROS (+)   Anesthesia Other Findings Past Medical History: No date: Abnormal TSH No date: Anxiety No date: Bipolar disorder (HCC) No date: GERD (gastroesophageal reflux disease) No date: History of mononucleosis No date: Hyperglycemia No date: Lymphocytosis No date: Vitamin B 12 deficiency No date: Vitamin D deficiency  Past Surgical History: No date: VASECTOMY  BMI    Body Mass Index: 38.02 kg/m      Reproductive/Obstetrics negative OB ROS                             Anesthesia Physical Anesthesia Plan  ASA: 2  Anesthesia Plan: General   Post-op Pain Management:    Induction: Intravenous  PONV Risk Score and Plan: Propofol infusion and TIVA  Airway Management Planned: Natural Airway  Additional Equipment:   Intra-op Plan:   Post-operative Plan:   Informed Consent: I have reviewed the patients History and Physical, chart, labs and discussed the procedure including the risks, benefits and alternatives for the proposed anesthesia with the patient or authorized representative who has indicated his/her understanding and  acceptance.     Dental Advisory Given  Plan Discussed with: CRNA and Surgeon  Anesthesia Plan Comments:         Anesthesia Quick Evaluation

## 2022-05-20 NOTE — Transfer of Care (Signed)
Immediate Anesthesia Transfer of Care Note  Patient: Rodney Peters  Procedure(s) Performed: COLONOSCOPY WITH PROPOFOL ESOPHAGOGASTRODUODENOSCOPY (EGD) WITH PROPOFOL  Patient Location: PACU  Anesthesia Type:General  Level of Consciousness: drowsy  Airway & Oxygen Therapy: Patient Spontanous Breathing and Patient connected to face mask oxygen  Post-op Assessment: Report given to RN and Post -op Vital signs reviewed and stable  Post vital signs: Reviewed and stable  Last Vitals:  Vitals Value Taken Time  BP 166/99 05/20/22 1030  Temp 35.6 C 05/20/22 1030  Pulse 63 05/20/22 1032  Resp 21 05/20/22 1032  SpO2 100 % 05/20/22 1032  Vitals shown include unvalidated device data.  Last Pain:  Vitals:   05/20/22 1030  TempSrc: Temporal  PainSc: Asleep         Complications: No notable events documented.

## 2022-05-20 NOTE — Op Note (Signed)
[facility] Patient Name: Rodney Peters Procedure Date: 05/20/2022 9:45 AM MRN: [id number] Account #: [id number] Date of Birth: Jun 15, 1975 Admit Type: Outpatient Age: 47 Room: [facility] ENDO ROOM 2 Gender: Male Note Status: Finalized Instrument Name: Upper Endoscope [id number] Procedure:             Upper GI endoscopy Indications:           Dysphagia, Follow-up of gastro-esophageal reflux                         disease Providers:             Lin Landsman MD, MD Referring MD:          Valerie Roys (Referring MD) Medicines:             General Anesthesia Complications:         No immediate complications. Estimated blood loss: None. Procedure:             Pre-Anesthesia Assessment:                        - Prior to the procedure, a History and Physical was                         performed, and patient medications and allergies were                         reviewed. The patient is competent. The risks and                         benefits of the procedure and the sedation options and                         risks were discussed with the patient. All questions                         were answered and informed consent was obtained.                         Patient identification and proposed procedure were                         verified by the physician, the nurse, the                         anesthesiologist, the anesthetist and the technician                         in the pre-procedure area in the procedure room in the                         endoscopy suite. Mental Status Examination: alert and                         oriented. Airway Examination: normal oropharyngeal                         airway and neck mobility. Respiratory Examination:  clear to auscultation. CV Examination: normal.                         Prophylactic Antibiotics: The patient does not require                         prophylactic antibiotics. Prior  Anticoagulants: The                         patient has taken no anticoagulant or antiplatelet                         agents. ASA Grade Assessment: III - A patient with                         severe systemic disease. After reviewing the risks and                         benefits, the patient was deemed in satisfactory                         condition to undergo the procedure. The anesthesia                         plan was to use general anesthesia. Immediately prior                         to administration of medications, the patient was                         re-assessed for adequacy to receive sedatives. The                         heart rate, respiratory rate, oxygen saturations,                         blood pressure, adequacy of pulmonary ventilation, and                         response to care were monitored throughout the                         procedure. The physical status of the patient was                         re-assessed after the procedure.                        After obtaining informed consent, the endoscope was                         passed under direct vision. Throughout the procedure,                         the patient's blood pressure, pulse, and oxygen                         saturations were monitored continuously. The Endoscope  was introduced through the mouth, and advanced to the                         second part of duodenum. The upper GI endoscopy was                         accomplished without difficulty. The patient tolerated                         the procedure well. Findings:      The duodenal bulb and second portion of the duodenum were normal.      The entire examined stomach was normal.      A small hiatal hernia was present.      Esophagogastric landmarks were identified: the gastroesophageal junction       was found at 40 cm from the incisors.      The gastroesophageal junction and examined esophagus were normal.        Biopsies were obtained from the proximal and distal esophagus with cold       forceps for histology of suspected eosinophilic esophagitis. Impression:            - Normal duodenal bulb and second portion of the                         duodenum.                        - Normal stomach.                        - Small hiatal hernia.                        - Esophagogastric landmarks identified.                        - Normal gastroesophageal junction and esophagus.                        - Biopsies were taken with a cold forceps for                         evaluation of eosinophilic esophagitis. Recommendation:        - Await pathology results.                        - Follow an antireflux regimen.                        - Continue present medications.                        - Proceed with colonoscopy as scheduled                        See colonoscopy report Procedure Code(s):     --- Professional ---                        [phone], Esophagogastroduodenoscopy, flexible,  transoral; with biopsy, single or multiple Diagnosis Code(s):     --- Professional ---                        K44.9, Diaphragmatic hernia without obstruction or                         gangrene                        R13.10, Dysphagia, unspecified                        K21.9, Gastro-esophageal reflux disease without                         esophagitis CPT copyright 2022 American Medical Association. All rights reserved. The codes documented in this report are preliminary and upon coder review may  be revised to meet current compliance requirements. Dr. Ulyess Mort Lin Landsman MD, MD 05/20/2022 10:01:36 AM This report has been signed electronically. Number of Addenda: 0 Note Initiated On: 05/20/2022 9:45 AM Estimated Blood Loss:  Estimated blood loss: none.      [facility]

## 2022-05-23 ENCOUNTER — Encounter: Payer: Self-pay | Admitting: Gastroenterology

## 2022-05-23 LAB — SURGICAL PATHOLOGY

## 2022-05-24 ENCOUNTER — Other Ambulatory Visit: Payer: Self-pay | Admitting: Psychiatry

## 2022-05-24 ENCOUNTER — Encounter: Payer: Self-pay | Admitting: Gastroenterology

## 2022-05-24 DIAGNOSIS — F3181 Bipolar II disorder: Secondary | ICD-10-CM

## 2022-05-24 DIAGNOSIS — F422 Mixed obsessional thoughts and acts: Secondary | ICD-10-CM

## 2022-06-29 ENCOUNTER — Other Ambulatory Visit: Payer: Self-pay | Admitting: Psychiatry

## 2022-06-29 DIAGNOSIS — F422 Mixed obsessional thoughts and acts: Secondary | ICD-10-CM

## 2022-06-29 DIAGNOSIS — F3181 Bipolar II disorder: Secondary | ICD-10-CM

## 2022-08-01 ENCOUNTER — Telehealth: Payer: Self-pay | Admitting: Psychiatry

## 2022-08-01 DIAGNOSIS — F3181 Bipolar II disorder: Secondary | ICD-10-CM

## 2022-08-01 MED ORDER — DIVALPROEX SODIUM ER 500 MG PO TB24: 1500.0000 mg | ORAL_TABLET | Freq: Every day | ORAL | 1 refills | Status: DC

## 2022-08-01 NOTE — Telephone Encounter (Signed)
Rodney Peters called at 11:13 to request refill of his Depakote.  Appt 7/10.  He said the pharmacy told him he had to call.  Send to  [id number] - [place], [place] - [address]

## 2022-08-01 NOTE — Telephone Encounter (Signed)
Rx sent in January. Pharmacy saying they never received it. Rx resent.

## 2022-09-28 ENCOUNTER — Ambulatory Visit: Payer: 59 | Admitting: Psychiatry

## 2022-10-18 ENCOUNTER — Ambulatory Visit: Payer: 59 | Admitting: Family Medicine

## 2022-10-18 ENCOUNTER — Encounter: Payer: Self-pay | Admitting: Family Medicine

## 2022-10-18 VITALS — BP 130/85 | HR 69 | Temp 98.1°F | Wt 278.2 lb

## 2022-10-18 DIAGNOSIS — E559 Vitamin D deficiency, unspecified: Secondary | ICD-10-CM

## 2022-10-18 DIAGNOSIS — E785 Hyperlipidemia, unspecified: Secondary | ICD-10-CM | POA: Diagnosis not present

## 2022-10-18 DIAGNOSIS — F3181 Bipolar II disorder: Secondary | ICD-10-CM

## 2022-10-18 DIAGNOSIS — I10 Essential (primary) hypertension: Secondary | ICD-10-CM | POA: Diagnosis not present

## 2022-10-18 DIAGNOSIS — J4540 Moderate persistent asthma, uncomplicated: Secondary | ICD-10-CM

## 2022-10-18 DIAGNOSIS — M1A09X0 Idiopathic chronic gout, multiple sites, without tophus (tophi): Secondary | ICD-10-CM

## 2022-10-18 DIAGNOSIS — K219 Gastro-esophageal reflux disease without esophagitis: Secondary | ICD-10-CM

## 2022-10-18 MED ORDER — ALLOPURINOL 300 MG PO TABS: 300.0000 mg | ORAL_TABLET | Freq: Every day | ORAL | 1 refills | Status: DC

## 2022-10-18 MED ORDER — OMEPRAZOLE 40 MG PO CPDR: 40.0000 mg | DELAYED_RELEASE_CAPSULE | Freq: Every day | ORAL | 1 refills | Status: DC

## 2022-10-18 MED ORDER — FENOFIBRATE 145 MG PO TABS: 145.0000 mg | ORAL_TABLET | Freq: Every day | ORAL | 1 refills | Status: DC

## 2022-10-18 MED ORDER — LISINOPRIL 10 MG PO TABS: 10.0000 mg | ORAL_TABLET | Freq: Every day | ORAL | 1 refills | Status: DC

## 2022-10-18 MED ORDER — COLCHICINE 0.6 MG PO TABS: 0.6000 mg | ORAL_TABLET | Freq: Every day | ORAL | 1 refills | Status: DC

## 2022-10-18 MED ORDER — ATORVASTATIN CALCIUM 40 MG PO TABS: 40.0000 mg | ORAL_TABLET | Freq: Every day | ORAL | 1 refills | Status: DC

## 2022-10-18 MED ORDER — ALBUTEROL SULFATE HFA 108 (90 BASE) MCG/ACT IN AERS: 2.0000 | INHALATION_SPRAY | Freq: Four times a day (QID) | RESPIRATORY_TRACT | 5 refills | Status: DC | PRN

## 2022-10-18 NOTE — Assessment & Plan Note (Signed)
Under good control on current regimen. Continue current regimen. Continue to monitor. Call with any concerns. Refills given.   

## 2022-10-18 NOTE — Assessment & Plan Note (Signed)
Under good control on current regimen. Continue current regimen. Continue to monitor. Call with any concerns. Refills given. Labs drawn today.   

## 2022-10-18 NOTE — Assessment & Plan Note (Signed)
Continue to follow with psychiatry. Labs drawn today. Call with any concerns.

## 2022-10-18 NOTE — Progress Notes (Signed)
BP 130/85   Pulse 69   Temp 98.1 F (36.7 C) (Oral)   Wt 278 lb 3.2 oz (126.2 kg)   SpO2 96%   BMI 39.92 kg/m    Subjective:    Patient ID: Rodney Peters, male    DOB: 24-Oct-1975, 47 y.o.   MRN: [id number]  HPI: Rodney Peters is a 47 y.o. male  Chief Complaint  Patient presents with   Hypertension   Hyperlipidemia   HYPERTENSION / HYPERLIPIDEMIA Satisfied with current treatment? yes Duration of hypertension: chronic BP monitoring frequency: not checking BP medication side effects: no Past BP meds: lisinopril Duration of hyperlipidemia: chronic Cholesterol medication side effects: no Cholesterol supplements: none Past cholesterol medications: fenofibrate, atorvastatin Medication compliance: excellent compliance Aspirin: no Recent stressors: no Recurrent headaches: no Visual changes: no Palpitations: no Dyspnea: no Chest pain: no Lower extremity edema: no Dizzy/lightheaded: no  No gout flares. Tolerating medicine well.   ASTHMA Asthma status: controlled Satisfied with current treatment?: yes Albuterol/rescue inhaler frequency:  Dyspnea frequency:  Wheezing frequency: Cough frequency:  Nocturnal symptom frequency:  Limitation of activity: no Current upper respiratory symptoms: no Aerochamber/spacer use: no Visits to ER or Urgent Care in past year: no Pneumovax: Up to Date Influenza: Up to Date  Relevant past medical, surgical, family and social history reviewed and updated as indicated. Interim medical history since our last visit reviewed. Allergies and medications reviewed and updated.  Review of Systems  Constitutional: Negative.   Respiratory: Negative.    Cardiovascular: Negative.   Gastrointestinal: Negative.   Musculoskeletal: Negative.   Neurological: Negative.   Psychiatric/Behavioral: Negative.      Per HPI unless specifically indicated above     Objective:    BP 130/85   Pulse 69   Temp 98.1 F (36.7 C) (Oral)   Wt 278 lb  3.2 oz (126.2 kg)   SpO2 96%   BMI 39.92 kg/m   Wt Readings from Last 3 Encounters:  10/18/22 278 lb 3.2 oz (126.2 kg)  05/20/22 265 lb (120.2 kg)  05/18/22 272 lb 8 oz (123.6 kg)    Physical Exam Vitals and nursing note reviewed.  Constitutional:      General: He is not in acute distress.    Appearance: Normal appearance. He is not ill-appearing, toxic-appearing or diaphoretic.  HENT:     Head: Normocephalic and atraumatic.     Right Ear: External ear normal.     Left Ear: External ear normal.     Nose: Nose normal.     Mouth/Throat:     Mouth: Mucous membranes are moist.     Pharynx: Oropharynx is clear.  Eyes:     General: No scleral icterus.       Right eye: No discharge.        Left eye: No discharge.     Extraocular Movements: Extraocular movements intact.     Conjunctiva/sclera: Conjunctivae normal.     Pupils: Pupils are equal, round, and reactive to light.  Cardiovascular:     Rate and Rhythm: Normal rate and regular rhythm.     Pulses: Normal pulses.     Heart sounds: Normal heart sounds. No murmur heard.    No friction rub. No gallop.  Pulmonary:     Effort: Pulmonary effort is normal. No respiratory distress.     Breath sounds: Normal breath sounds. No stridor. No wheezing, rhonchi or rales.  Chest:     Chest wall: No tenderness.  Musculoskeletal:  General: Normal range of motion.     Cervical back: Normal range of motion and neck supple.  Skin:    General: Skin is warm and dry.     Capillary Refill: Capillary refill takes less than 2 seconds.     Coloration: Skin is not jaundiced or pale.     Findings: No bruising, erythema, lesion or rash.  Neurological:     General: No focal deficit present.     Mental Status: He is alert and oriented to person, place, and time. Mental status is at baseline.  Psychiatric:        Mood and Affect: Mood normal.        Behavior: Behavior normal.        Thought Content: Thought content normal.        Judgment:  Judgment normal.     Results for orders placed or performed during the hospital encounter of 05/20/22  Surgical pathology  Result Value Ref Range   SURGICAL PATHOLOGY      SURGICAL PATHOLOGY CASE: [id number] PATIENT: Rodney Peters Surgical Pathology Report     Specimen Submitted: A. Esophagus, distal; cbx B. Esophagus, proximal; cbx C. Colon polyp, transverse; cold snare  Clinical History: Colonoscopy screening Z12.11 EGD chronic GERD K21.9 dysphagia R13.19.  Small hiatal hernia, transverse colon polyp    DIAGNOSIS: A. ESOPHAGUS, DISTAL; COLD BIOPSY: - BENIGN SQUAMOUS MUCOSA WITH MILD ACANTHOSIS. - NO INCREASE IN INTRAEPITHELIAL EOSINOPHILS (LESS THAN 2 PER HPF). - NEGATIVE FOR DYSPLASIA AND MALIGNANCY.  B. ESOPHAGUS, PROXIMAL; COLD BIOPSY: - BENIGN SQUAMOUS MUCOSA WITH MILD ACANTHOSIS. - NO INCREASE IN INTRAEPITHELIAL EOSINOPHILS (LESS THAN 2 PER HPF). - NEGATIVE FOR DYSPLASIA AND MALIGNANCY.  C. COLON POLYP, TRANSVERSE; COLD SNARE: - TUBULAR ADENOMA. - NEGATIVE FOR HIGH-GRADE DYSPLASIA AND MALIGNANCY.  GROSS DESCRIPTION: A. Labeled: Distal esophagus cbxs Received: Formalin Collection time: 9:57 AM on 05/20/2022 Pla ced into formalin time: 9:57 AM on 05/20/2022 Tissue fragment(s): 5 Size: Aggregate, 1 x 0.4 x 0.1 cm Description: Tan soft tissue fragments Entirely submitted in 1 cassette.  B. Labeled: Proximal esophagus cbxs Received: Formalin Collection time: 9:59 AM on 05/20/2022 Placed into formalin time: 9:59 AM on 05/20/2022 Tissue fragment(s): 3 Size: Aggregate, 1.2 x 0.3 x 0.1 cm Description: Tan focally translucent soft tissue fragments Entirely submitted in 1 cassette.  C. Labeled: Cold snare transverse colon polyp x 1 Received: Formalin Collection time: 10:14 AM on 05/20/2022 Placed into formalin time: 10:14 AM on 05/20/2022 Tissue fragment(s): 1 Size: 0.4 x 0.4 x 0.1 cm Description: Tan soft tissue fragment Entirely submitted in 1 cassette.  RB  05/20/2022  Final Diagnosis performed by Katherine Mantle, MD.   Electronically signed 05/23/2022 9:15:15AM The electronic signature indicates that the named Attending Pathologist has evaluated the specimen Technical component performed  at [facility], [address], [place], [place] [address] Lab: [phone] Dir: Jolene Schimke, MD, MMM  Professional component performed at [facility], [facility], [address] [place], [place], [place] [address] Lab: [phone] Dir: Beryle Quant, MD       Assessment & Plan:   Problem List Items Addressed This Visit       Cardiovascular and Mediastinum   Essential hypertension    Under good control on current regimen. Continue current regimen. Continue to monitor. Call with any concerns. Refills given. Labs drawn today.        Relevant Medications   atorvastatin (LIPITOR) 40 MG tablet   fenofibrate (TRICOR) 145 MG tablet   lisinopril (ZESTRIL) 10 MG tablet  Other Relevant Orders   CBC with Differential/Platelet   Comprehensive metabolic panel     Respiratory   Moderate persistent asthma without complication    Under good control on current regimen. Continue current regimen. Continue to monitor. Call with any concerns. Refills given.        Relevant Medications   albuterol (VENTOLIN HFA) 108 (90 Base) MCG/ACT inhaler     Digestive   Chronic GERD    Under good control on current regimen. Continue current regimen. Continue to monitor. Call with any concerns. Refills given.        Relevant Medications   omeprazole (PRILOSEC) 40 MG capsule     Musculoskeletal and Integument   Chronic gout of multiple sites    Under good control on current regimen. Continue current regimen. Continue to monitor. Call with any concerns. Refills given. Labs drawn today.       Relevant Medications   allopurinol (ZYLOPRIM) 300 MG tablet   colchicine 0.6 MG tablet   Other Relevant Orders   CBC with Differential/Platelet   Comprehensive metabolic  panel   Uric acid     Other   Vitamin D deficiency    Rechecking labs today. Await results. Treat as needed.       Relevant Orders   CBC with Differential/Platelet   Comprehensive metabolic panel   VITAMIN D 25 Hydroxy (Vit-D Deficiency, Fractures)   Bipolar 2 disorder (HCC)    Continue to follow with psychiatry. Labs drawn today. Call with any concerns.       Relevant Orders   Valproic Acid level   Lithium level   Lamotrigine level   Hyperlipidemia - Primary    Under good control on current regimen. Continue current regimen. Continue to monitor. Call with any concerns. Refills given. Labs drawn today.        Relevant Medications   atorvastatin (LIPITOR) 40 MG tablet   fenofibrate (TRICOR) 145 MG tablet   lisinopril (ZESTRIL) 10 MG tablet   Other Relevant Orders   CBC with Differential/Platelet   Lipid Panel w/o Chol/HDL Ratio   Comprehensive metabolic panel     Follow up plan: Return in about 6 months (around 04/20/2023) for physical.

## 2022-10-18 NOTE — Assessment & Plan Note (Signed)
Rechecking labs today. Await results. Treat as needed.  °

## 2022-11-15 ENCOUNTER — Telehealth: Payer: Self-pay | Admitting: Psychiatry

## 2022-11-15 DIAGNOSIS — F422 Mixed obsessional thoughts and acts: Secondary | ICD-10-CM

## 2022-11-15 DIAGNOSIS — F411 Generalized anxiety disorder: Secondary | ICD-10-CM

## 2022-11-15 NOTE — Telephone Encounter (Signed)
Pt called requesting refill of Fluvoxamine to    [id number] - [place], [place] - [address], [place] [address] Phone: [phone]  Fax: [phone]   No upcoming appts scheduled.

## 2022-11-15 NOTE — Telephone Encounter (Signed)
Sent MyChart message to schedule an appt

## 2022-11-16 MED ORDER — FLUVOXAMINE MALEATE 100 MG PO TABS: 100.0000 mg | ORAL_TABLET | Freq: Every day | ORAL | 0 refills | Status: DC

## 2022-11-16 NOTE — Telephone Encounter (Signed)
Sent 30 day supply

## 2022-12-15 ENCOUNTER — Other Ambulatory Visit: Payer: Self-pay | Admitting: Psychiatry

## 2022-12-15 DIAGNOSIS — F422 Mixed obsessional thoughts and acts: Secondary | ICD-10-CM

## 2022-12-15 DIAGNOSIS — F411 Generalized anxiety disorder: Secondary | ICD-10-CM

## 2022-12-16 NOTE — Telephone Encounter (Signed)
Pt overdue.  Call to [facility]

## 2022-12-16 NOTE — Telephone Encounter (Signed)
Pt number is not working

## 2023-01-10 ENCOUNTER — Other Ambulatory Visit: Payer: Self-pay | Admitting: Psychiatry

## 2023-01-10 DIAGNOSIS — F3181 Bipolar II disorder: Secondary | ICD-10-CM

## 2023-01-10 DIAGNOSIS — F411 Generalized anxiety disorder: Secondary | ICD-10-CM

## 2023-01-10 DIAGNOSIS — F422 Mixed obsessional thoughts and acts: Secondary | ICD-10-CM

## 2023-01-10 NOTE — Telephone Encounter (Signed)
Please call to schedule FU, did not respond to MyChart request

## 2023-01-12 NOTE — Telephone Encounter (Signed)
Pt is scheduled for 12.31

## 2023-02-19 ENCOUNTER — Other Ambulatory Visit: Payer: Self-pay | Admitting: Psychiatry

## 2023-02-19 DIAGNOSIS — F3181 Bipolar II disorder: Secondary | ICD-10-CM

## 2023-03-06 ENCOUNTER — Other Ambulatory Visit: Payer: Self-pay | Admitting: Psychiatry

## 2023-03-06 DIAGNOSIS — F3181 Bipolar II disorder: Secondary | ICD-10-CM

## 2023-03-21 ENCOUNTER — Ambulatory Visit: Payer: 59 | Admitting: Psychiatry

## 2023-04-17 ENCOUNTER — Other Ambulatory Visit: Payer: Self-pay | Admitting: Psychiatry

## 2023-04-17 DIAGNOSIS — F411 Generalized anxiety disorder: Secondary | ICD-10-CM

## 2023-04-17 DIAGNOSIS — F3181 Bipolar II disorder: Secondary | ICD-10-CM

## 2023-04-17 DIAGNOSIS — F422 Mixed obsessional thoughts and acts: Secondary | ICD-10-CM

## 2023-04-18 ENCOUNTER — Other Ambulatory Visit: Payer: Self-pay | Admitting: Family Medicine

## 2023-04-20 NOTE — Telephone Encounter (Signed)
Requested Prescriptions  Pending Prescriptions Disp Refills   atorvastatin (LIPITOR) 40 MG tablet [Pharmacy Med Name: ATORVASTATIN 40 MG TABLET] 90 tablet 1    Sig: TAKE 1 TABLET BY MOUTH EVERY DAY     Cardiovascular:  Antilipid - Statins Failed - 04/20/2023  1:34 PM      Failed - Lipid Panel in normal range within the last 12 months    Cholesterol, Total  Date Value Ref Range Status  10/18/2022 216 (H) 100 - 199 mg/dL Final   LDL Chol Calc (NIH)  Date Value Ref Range Status  10/18/2022 122 (H) 0 - 99 mg/dL Final   HDL  Date Value Ref Range Status  10/18/2022 24 (L) >39 mg/dL Final   Triglycerides  Date Value Ref Range Status  10/18/2022 392 (H) 0 - 149 mg/dL Final         Passed - Patient is not pregnant      Passed - Valid encounter within last 12 months    Recent Outpatient Visits           6 months ago Hyperlipidemia, unspecified hyperlipidemia type   [facility], Megan P, DO   1 year ago Chronic gout of multiple sites, unspecified cause   [facility], Megan P, DO   1 year ago Routine general medical examination at a health care facility   [facility], [place] P, DO   2 years ago Hyperlipidemia, unspecified hyperlipidemia type   [facility] Vigg, Avanti, MD   2 years ago Chest pain, unspecified type   [facility] Gerre Scull, NP

## 2023-04-21 ENCOUNTER — Encounter: Payer: Self-pay | Admitting: Family Medicine

## 2023-05-10 ENCOUNTER — Ambulatory Visit (INDEPENDENT_AMBULATORY_CARE_PROVIDER_SITE_OTHER): Payer: 59 | Admitting: Psychiatry

## 2023-05-10 ENCOUNTER — Other Ambulatory Visit: Payer: Self-pay | Admitting: Family Medicine

## 2023-05-10 ENCOUNTER — Encounter: Payer: Self-pay | Admitting: Psychiatry

## 2023-05-10 DIAGNOSIS — F3181 Bipolar II disorder: Secondary | ICD-10-CM | POA: Diagnosis not present

## 2023-05-10 DIAGNOSIS — F422 Mixed obsessional thoughts and acts: Secondary | ICD-10-CM

## 2023-05-10 DIAGNOSIS — G4733 Obstructive sleep apnea (adult) (pediatric): Secondary | ICD-10-CM

## 2023-05-10 DIAGNOSIS — F411 Generalized anxiety disorder: Secondary | ICD-10-CM

## 2023-05-10 DIAGNOSIS — M1A09X0 Idiopathic chronic gout, multiple sites, without tophus (tophi): Secondary | ICD-10-CM

## 2023-05-10 MED ORDER — DIVALPROEX SODIUM ER 500 MG PO TB24
1500.0000 mg | ORAL_TABLET | Freq: Every day | ORAL | 0 refills | Status: DC
Start: 2023-05-10 — End: 2023-10-13

## 2023-05-10 MED ORDER — FLUVOXAMINE MALEATE 100 MG PO TABS: 150.0000 mg | ORAL_TABLET | Freq: Every day | ORAL | 0 refills | Status: DC

## 2023-05-10 MED ORDER — LAMOTRIGINE 100 MG PO TABS: 100.0000 mg | ORAL_TABLET | Freq: Every day | ORAL | 0 refills | Status: DC

## 2023-05-10 MED ORDER — LITHIUM CARBONATE ER 300 MG PO TBCR: EXTENDED_RELEASE_TABLET | ORAL | 0 refills | Status: DC
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
## 2023-05-11 NOTE — Telephone Encounter (Signed)
Requested medication (s) are due for refill today: yes  Requested medication (s) are on the active medication list: yes  Last refill:  10/18/22 #90/1  Future visit scheduled: no  Notes to clinic:  Unable to refill per protocol due to failed labs, no updated results. Pt needs to rescheduled for CPE from 03/2023     Requested Prescriptions  Pending Prescriptions Disp Refills   lisinopril (ZESTRIL) 10 MG tablet [Pharmacy Med Name: LISINOPRIL 10 MG TABLET] 90 tablet 2    Sig: TAKE 1 TABLET BY MOUTH EVERY DAY     Cardiovascular:  ACE Inhibitors Failed - 05/11/2023  9:56 AM      Failed - Cr in normal range and within 180 days    Creatinine, Ser  Date Value Ref Range Status  10/18/2022 1.36 (H) 0.76 - 1.27 mg/dL Final         Failed - K in normal range and within 180 days    Potassium  Date Value Ref Range Status  10/18/2022 4.5 3.5 - 5.2 mmol/L Final         Failed - Valid encounter within last 6 months    Recent Outpatient Visits           6 months ago Hyperlipidemia, unspecified hyperlipidemia type   [place] [facility] [place], Megan P, DO   1 year ago Chronic gout of multiple sites, unspecified cause   [place] [facility] [place], Megan P, DO   1 year ago Routine general medical examination at a health care facility   [facility] [place], [place] P, DO   2 years ago Hyperlipidemia, unspecified hyperlipidemia type   [place] [facility] [place], Avanti, MD   2 years ago Chest pain, unspecified type   [place] [facility], Jake [place], NP              Passed - Patient is not pregnant      Passed - Last BP in normal range    BP Readings from Last 1 Encounters:  10/18/22 130/85          fenofibrate (TRICOR) 145 MG tablet [Pharmacy Med Name: FENOFIBRATE 145 MG TABLET] 90 tablet 2    Sig: TAKE 1 TABLET BY MOUTH EVERY DAY     Cardiovascular:  Antilipid - Fibric Acid  Derivatives Failed - 05/11/2023  9:56 AM      Failed - Cr in normal range and within 360 days    Creatinine, Ser  Date Value Ref Range Status  10/18/2022 1.36 (H) 0.76 - 1.27 mg/dL Final         Failed - Lipid Panel in normal range within the last 12 months    Cholesterol, Total  Date Value Ref Range Status  10/18/2022 216 (H) 100 - 199 mg/dL Final   LDL Chol Calc (NIH)  Date Value Ref Range Status  10/18/2022 122 (H) 0 - 99 mg/dL Final   HDL  Date Value Ref Range Status  10/18/2022 24 (L) >39 mg/dL Final   Triglycerides  Date Value Ref Range Status  10/18/2022 392 (H) 0 - 149 mg/dL Final         Passed - ALT in normal range and within 360 days    ALT  Date Value Ref Range Status  10/18/2022 38 0 - 44 IU/L Final         Passed - AST in normal range and within 360 days  AST  Date Value Ref Range Status  10/18/2022 32 0 - 40 IU/L Final         Passed - HGB in normal range and within 360 days    Hemoglobin  Date Value Ref Range Status  10/18/2022 15.3 13.0 - 17.7 g/dL Final         Passed - HCT in normal range and within 360 days    Hematocrit  Date Value Ref Range Status  10/18/2022 46.2 37.5 - 51.0 % Final         Passed - PLT in normal range and within 360 days    Platelets  Date Value Ref Range Status  10/18/2022 209 150 - 450 x10E3/uL Final         Passed - WBC in normal range and within 360 days    WBC  Date Value Ref Range Status  10/18/2022 8.0 3.4 - 10.8 x10E3/uL Final         Passed - eGFR is 30 or above and within 360 days    GFR calc Af Amer  Date Value Ref Range Status  12/26/2019 98 >59 mL/min/1.73 Final    Comment:    **[facility] currently reports eGFR in compliance with the current**   recommendations of the [employer]. [facility] will   update reporting as new guidelines are published from the NKF-ASN   Task force.    GFR calc non Af Amer  Date Value Ref Range Status  12/26/2019 85 >59 mL/min/1.73 Final   eGFR   Date Value Ref Range Status  10/18/2022 65 >59 mL/min/1.73 Final         Passed - Valid encounter within last 12 months    Recent Outpatient Visits           6 months ago Hyperlipidemia, unspecified hyperlipidemia type   [place] [facility] [place], Megan P, DO   1 year ago Chronic gout of multiple sites, unspecified cause   [place] [facility] [place], Megan P, DO   1 year ago Routine general medical examination at a health care facility   [place] [place], [place] P, DO   2 years ago Hyperlipidemia, unspecified hyperlipidemia type   [place] [facility] [place], Avanti, MD   2 years ago Chest pain, unspecified type   [place] [facility], Lauren A, NP               allopurinol (ZYLOPRIM) 300 MG tablet [Pharmacy Med Name: ALLOPURINOL 300 MG TABLET] 90 tablet 2    Sig: TAKE 1 TABLET BY MOUTH EVERY DAY     Endocrinology:  Gout Agents - allopurinol Failed - 05/11/2023  9:56 AM      Failed - Cr in normal range and within 360 days    Creatinine, Ser  Date Value Ref Range Status  10/18/2022 1.36 (H) 0.76 - 1.27 mg/dL Final         Passed - Uric Acid in normal range and within 360 days    Uric Acid  Date Value Ref Range Status  10/18/2022 5.1 3.8 - 8.4 mg/dL Final    Comment:               Therapeutic target for gout patients: <6.0         Passed - Valid encounter within last 12 months    Recent Outpatient Visits           6 months ago  Hyperlipidemia, unspecified hyperlipidemia type   [place] [facility] [place], [place] P, DO   1 year ago Chronic gout of multiple sites, unspecified cause   [place] [facility] [place], Megan P, DO   1 year ago Routine general medical examination at a health care facility   [facility] [place], [place] P, DO   2 years ago Hyperlipidemia, unspecified hyperlipidemia type   [place]  [facility] [place], Avanti, MD   2 years ago Chest pain, unspecified type    [facility], Lauren A, NP              Passed - CBC within normal limits and completed in the last 12 months    WBC  Date Value Ref Range Status  10/18/2022 8.0 3.4 - 10.8 x10E3/uL Final   RBC  Date Value Ref Range Status  10/18/2022 5.35 4.14 - 5.80 x10E6/uL Final   Hemoglobin  Date Value Ref Range Status  10/18/2022 15.3 13.0 - 17.7 g/dL Final   Hematocrit  Date Value Ref Range Status  10/18/2022 46.2 37.5 - 51.0 % Final   MCHC  Date Value Ref Range Status  10/18/2022 33.1 31.5 - 35.7 g/dL Final   [facility]  Date Value Ref Range Status  10/18/2022 28.6 26.6 - 33.0 pg Final   MCV  Date Value Ref Range Status  10/18/2022 86 79 - 97 fL Final   No results found for: "PLTCOUNTKUC", "LABPLAT", "POCPLA" RDW  Date Value Ref Range Status  10/18/2022 13.2 11.6 - 15.4 % Final

## 2023-07-01 ENCOUNTER — Other Ambulatory Visit: Payer: Self-pay | Admitting: Family Medicine

## 2023-07-03 NOTE — Telephone Encounter (Signed)
 OV needed for additional RF.  Requested Prescriptions  Pending Prescriptions Disp Refills   fenofibrate (TRICOR) 145 MG tablet [Pharmacy Med Name: FENOFIBRATE 145 MG TABLET] 30 tablet 0    Sig: TAKE 1 TABLET BY MOUTH EVERY DAY     Cardiovascular:  Antilipid - Fibric Acid Derivatives Failed - 07/03/2023  3:27 PM      Failed - Cr in normal range and within 360 days    Creatinine, Ser  Date Value Ref Range Status  10/18/2022 1.36 (H) 0.76 - 1.27 mg/dL Final         Failed - Valid encounter within last 12 months    Recent Outpatient Visits   None            Failed - Lipid Panel in normal range within the last 12 months    Cholesterol, Total  Date Value Ref Range Status  10/18/2022 216 (H) 100 - 199 mg/dL Final   LDL Chol Calc (NIH)  Date Value Ref Range Status  10/18/2022 122 (H) 0 - 99 mg/dL Final   HDL  Date Value Ref Range Status  10/18/2022 24 (L) >39 mg/dL Final   Triglycerides  Date Value Ref Range Status  10/18/2022 392 (H) 0 - 149 mg/dL Final         Passed - ALT in normal range and within 360 days    ALT  Date Value Ref Range Status  10/18/2022 38 0 - 44 IU/L Final         Passed - AST in normal range and within 360 days    AST  Date Value Ref Range Status  10/18/2022 32 0 - 40 IU/L Final         Passed - HGB in normal range and within 360 days    Hemoglobin  Date Value Ref Range Status  10/18/2022 15.3 13.0 - 17.7 g/dL Final         Passed - HCT in normal range and within 360 days    Hematocrit  Date Value Ref Range Status  10/18/2022 46.2 37.5 - 51.0 % Final         Passed - PLT in normal range and within 360 days    Platelets  Date Value Ref Range Status  10/18/2022 209 150 - 450 x10E3/uL Final         Passed - WBC in normal range and within 360 days    WBC  Date Value Ref Range Status  10/18/2022 8.0 3.4 - 10.8 x10E3/uL Final         Passed - eGFR is 30 or above and within 360 days    GFR calc Af Amer  Date Value Ref Range Status   12/26/2019 98 >59 mL/min/1.73 Final    Comment:    **[facility] currently reports eGFR in compliance with the current**   recommendations of the [employer]. [facility] will   update reporting as new guidelines are published from the NKF-ASN   Task force.    GFR calc non Af Amer  Date Value Ref Range Status  12/26/2019 85 >59 mL/min/1.73 Final   eGFR  Date Value Ref Range Status  10/18/2022 65 >59 mL/min/1.73 Final

## 2023-07-18 ENCOUNTER — Encounter: Payer: Self-pay | Admitting: Family Medicine

## 2023-07-18 ENCOUNTER — Other Ambulatory Visit: Payer: Self-pay | Admitting: Family Medicine

## 2023-07-18 ENCOUNTER — Ambulatory Visit: Payer: Self-pay | Admitting: Family Medicine

## 2023-07-18 VITALS — BP 118/64 | HR 69 | Ht 70.0 in | Wt 280.2 lb

## 2023-07-18 DIAGNOSIS — M1A09X0 Idiopathic chronic gout, multiple sites, without tophus (tophi): Secondary | ICD-10-CM

## 2023-07-18 DIAGNOSIS — E559 Vitamin D deficiency, unspecified: Secondary | ICD-10-CM

## 2023-07-18 DIAGNOSIS — E785 Hyperlipidemia, unspecified: Secondary | ICD-10-CM

## 2023-07-18 DIAGNOSIS — I10 Essential (primary) hypertension: Secondary | ICD-10-CM

## 2023-07-18 DIAGNOSIS — J4540 Moderate persistent asthma, uncomplicated: Secondary | ICD-10-CM | POA: Diagnosis not present

## 2023-07-18 DIAGNOSIS — F3181 Bipolar II disorder: Secondary | ICD-10-CM

## 2023-07-18 DIAGNOSIS — Z0000 Encounter for general adult medical examination without abnormal findings: Secondary | ICD-10-CM

## 2023-07-18 DIAGNOSIS — G4733 Obstructive sleep apnea (adult) (pediatric): Secondary | ICD-10-CM

## 2023-07-18 DIAGNOSIS — D229 Melanocytic nevi, unspecified: Secondary | ICD-10-CM

## 2023-07-18 LAB — MICROALBUMIN, URINE WAIVED
Creatinine, Urine Waived: 200 mg/dL (ref 10–300)
Microalb, Ur Waived: 10 mg/L (ref 0–19)
Microalb/Creat Ratio: <30 mg/g (ref <30)

## 2023-07-18 LAB — BAYER DCA HB A1C WAIVED: HB A1C (BAYER DCA - WAIVED): 5.5 % (ref 4.8–5.6)

## 2023-07-18 MED ORDER — ALBUTEROL SULFATE HFA 108 (90 BASE) MCG/ACT IN AERS: 2.0000 | INHALATION_SPRAY | Freq: Four times a day (QID) | RESPIRATORY_TRACT | 5 refills | Status: DC | PRN

## 2023-07-18 MED ORDER — FENOFIBRATE 145 MG PO TABS: 145.0000 mg | ORAL_TABLET | Freq: Every day | ORAL | 1 refills | Status: DC

## 2023-07-18 MED ORDER — ALLOPURINOL 300 MG PO TABS: 300.0000 mg | ORAL_TABLET | Freq: Every day | ORAL | 1 refills | Status: DC

## 2023-07-18 MED ORDER — TIRZEPATIDE-WEIGHT MANAGEMENT 5 MG/0.5ML ~~LOC~~ SOLN: 5.0000 mg | SUBCUTANEOUS | 1 refills | Status: DC

## 2023-07-18 MED ORDER — ATORVASTATIN CALCIUM 40 MG PO TABS: 40.0000 mg | ORAL_TABLET | Freq: Every day | ORAL | 1 refills | Status: DC

## 2023-07-18 MED ORDER — COLCHICINE 0.6 MG PO TABS: 0.6000 mg | ORAL_TABLET | Freq: Every day | ORAL | 1 refills | Status: DC

## 2023-07-18 MED ORDER — TIRZEPATIDE-WEIGHT MANAGEMENT 2.5 MG/0.5ML ~~LOC~~ SOLN: 2.5000 mg | SUBCUTANEOUS | 0 refills | Status: DC

## 2023-07-18 MED ORDER — LISINOPRIL 10 MG PO TABS: 10.0000 mg | ORAL_TABLET | Freq: Every day | ORAL | 1 refills | Status: DC

## 2023-07-18 MED ORDER — OMEPRAZOLE 40 MG PO CPDR: 40.0000 mg | DELAYED_RELEASE_CAPSULE | Freq: Every day | ORAL | 1 refills | Status: DC

## 2023-07-18 NOTE — Addendum Note (Signed)
 Addended by: Solomon Dupre on: 07/18/2023 12:09 PM   Modules accepted: Level of Service

## 2023-07-18 NOTE — Assessment & Plan Note (Signed)
Continue to follow with psychiatry. Call with any concerns.  ?

## 2023-07-18 NOTE — Assessment & Plan Note (Signed)
 Stable with CPAP. Will start him on zepbound and recheck in 2 months. Call with any concerns.

## 2023-07-18 NOTE — Progress Notes (Signed)
 BP 118/64   Pulse 69   Ht 5\' 10"  (1.778 m)   Wt 280 lb 3.2 oz (127.1 kg)   SpO2 97%   BMI 40.20 kg/m    Subjective:    Patient ID: Rodney Peters, male    DOB: 1976/02/17, 48 y.o.   MRN: [id number]  HPI: Rodney Peters is a 48 y.o. male presenting on 07/18/2023 for comprehensive medical examination. Current medical complaints include:  HYPERTENSION / HYPERLIPIDEMIA Satisfied with current treatment? yes Duration of hypertension: chronic BP monitoring frequency: not checking BP medication side effects: no Past BP meds: lisinopril  Duration of hyperlipidemia: chronic Cholesterol medication side effects: yes Cholesterol supplements: none Past cholesterol medications: atorvastatin  Medication compliance: excellent compliance Aspirin: no Recent stressors: no Recurrent headaches: no Visual changes: no Palpitations: no Dyspnea: no Chest pain: no Lower extremity edema: no Dizzy/lightheaded: no  No gout flares.   Asthma has been doing well. No concerns.   SLEEP APNEA Sleep apnea status: controlled Duration: chronic Satisfied with current treatment?:  yes CPAP use:  yes Sleep quality with CPAP use: excellent Treament compliance:excellent compliance Last sleep study: several years ago Treatments attempted: CPAP Wakes feeling refreshed:  yes Daytime hypersomnolence:  yes Fatigue:  yes Insomnia:  no Good sleep hygiene:  no Difficulty falling asleep:  no Difficulty staying asleep:  no Snoring bothers bed partner:  yes Observed apnea by bed partner: yes Obesity:  yes Hypertension: yes  Pulmonary hypertension:  no Coronary artery disease:  no  He currently lives with: wife and kids Interim Problems from his last visit: no  Depression Screen done today and results listed below:     07/18/2023   11:14 AM 10/18/2022    9:28 AM 04/19/2022    9:43 AM 08/30/2021   11:19 AM 02/09/2021    9:09 AM  Depression screen PHQ 2/9  Decreased Interest 2 2 1 2 1   Down,  Depressed, Hopeless 1 2 3 1 1   PHQ - 2 Score 3 4 4 3 2   Altered sleeping 1 0 0 2 2  Tired, decreased energy 1 1 0 2 2  Change in appetite 3 0 1 3 3   Feeling bad or failure about yourself  2 2 3 3 3   Trouble concentrating 3 3 3 2 2   Moving slowly or fidgety/restless 2 2 1  0 1  Suicidal thoughts 0 0 3 1 2   PHQ-9 Score 15 12 15 16 17   Difficult doing work/chores Somewhat difficult Somewhat difficult Very difficult Somewhat difficult Somewhat difficult    Past Medical History:  Past Medical History:  Diagnosis Date   Abnormal TSH    Anxiety    Bipolar disorder (HCC)    GERD (gastroesophageal reflux disease)    History of mononucleosis    Hyperglycemia    Lymphocytosis    Vitamin B 12 deficiency    Vitamin D  deficiency     Surgical History:  Past Surgical History:  Procedure Laterality Date   COLONOSCOPY WITH PROPOFOL  N/A 05/20/2022   Procedure: COLONOSCOPY WITH PROPOFOL ;  Surgeon: Selena Daily, MD;  Location: [facility];  Service: Gastroenterology;  Laterality: N/A;   ESOPHAGOGASTRODUODENOSCOPY (EGD) WITH PROPOFOL  N/A 05/20/2022   Procedure: ESOPHAGOGASTRODUODENOSCOPY (EGD) WITH PROPOFOL ;  Surgeon: Selena Daily, MD;  Location: [facility];  Service: Gastroenterology;  Laterality: N/A;   VASECTOMY      Medications:  Current Outpatient Medications on File Prior to Visit  Medication Sig   divalproex  (DEPAKOTE  ER) 500 MG 24 hr tablet Take 3  tablets (1,500 mg total) by mouth daily.   fluticasone  (FLONASE ) 50 MCG/ACT nasal spray Place 2 sprays into both nostrils daily.   fluvoxaMINE  (LUVOX ) 100 MG tablet Take 1.5 tablets (150 mg total) by mouth at bedtime.   lamoTRIgine  (LAMICTAL ) 100 MG tablet Take 1 tablet (100 mg total) by mouth daily.   lithium  carbonate (LITHOBID ) 300 MG ER tablet TAKE 2 TABLETS BY MOUTH EVERY NIGHT   Multiple Vitamin (MULTIVITAMIN) tablet Take 1 tablet by mouth daily.   VITAMIN D , CHOLECALCIFEROL, PO Take 10,000 Units by mouth daily.   No  current facility-administered medications on file prior to visit.    Allergies:  No Known Allergies  Social History:  Social History   Socioeconomic History   Marital status: Married    Spouse name: Not on file   Number of children: Not on file   Years of education: Not on file   Highest education level: Not on file  Occupational History   Not on file  Tobacco Use   Smoking status: Former    Current packs/day: 0.00    Types: Cigarettes    Start date: 75    Quit date: 12/19/2012    Years since quitting: 10.5   Smokeless tobacco: Former    Types: Chew    Quit date: 2010   Tobacco comments:    would smoke 2 packs every 3 weeks, currently 1 a week or so  Vaping Use   Vaping status: Former  Substance and Sexual Activity   Alcohol use: Not Currently   Drug use: Yes    Types: Marijuana   Sexual activity: Yes    Birth control/protection: None  Other Topics Concern   Not on file  Social History Narrative   Not on file   Social Drivers of Health   Financial Resource Strain: Not on file  Food Insecurity: Not on file  Transportation Needs: Not on file  Physical Activity: Not on file  Stress: Not on file  Social Connections: Not on file  Intimate Partner Violence: Not on file   Social History   Tobacco Use  Smoking Status Former   Current packs/day: 0.00   Types: Cigarettes   Start date: 1998   Quit date: 12/19/2012   Years since quitting: 10.5  Smokeless Tobacco Former   Types: Chew   Quit date: 2010  Tobacco Comments   would smoke 2 packs every 3 weeks, currently 1 a week or so   Social History   Substance and Sexual Activity  Alcohol Use Not Currently    Family History:  Family History  Problem Relation Age of Onset   Anxiety disorder Father    COPD Father    Other Sister        substance abuse   Cancer Maternal Grandmother    Hypertension Maternal Grandfather    Cancer Paternal Grandfather     Past medical history, surgical history,  medications, allergies, family history and social history reviewed with patient today and changes made to appropriate areas of the chart.   Review of Systems  Constitutional:  Positive for diaphoresis. Negative for chills, fever, malaise/fatigue and weight loss.  HENT: Negative.    Eyes:  Positive for blurred vision. Negative for double vision, photophobia, pain, discharge and redness.  Respiratory:  Positive for shortness of breath. Negative for cough, hemoptysis, sputum production and wheezing.   Cardiovascular: Negative.   Gastrointestinal: Negative.        Very gassy  Genitourinary: Negative.   Musculoskeletal: Negative.  Skin: Negative.   Neurological:  Positive for tingling. Negative for dizziness, tremors, sensory change, speech change, focal weakness, seizures, loss of consciousness, weakness and headaches.  Endo/Heme/Allergies:  Positive for environmental allergies. Negative for polydipsia. Does not bruise/bleed easily.  Psychiatric/Behavioral: Negative.     All other ROS negative except what is listed above and in the HPI.      Objective:    BP 118/64   Pulse 69   Ht 5\' 10"  (1.778 m)   Wt 280 lb 3.2 oz (127.1 kg)   SpO2 97%   BMI 40.20 kg/m   Wt Readings from Last 3 Encounters:  07/18/23 280 lb 3.2 oz (127.1 kg)  10/18/22 278 lb 3.2 oz (126.2 kg)  05/20/22 265 lb (120.2 kg)    Physical Exam Vitals and nursing note reviewed.  Constitutional:      General: He is not in acute distress.    Appearance: Normal appearance. He is obese. He is not ill-appearing, toxic-appearing or diaphoretic.  HENT:     Head: Normocephalic and atraumatic.     Right Ear: Tympanic membrane, ear canal and external ear normal. There is no impacted cerumen.     Left Ear: Tympanic membrane, ear canal and external ear normal. There is no impacted cerumen.     Nose: Nose normal. No congestion or rhinorrhea.     Mouth/Throat:     Mouth: Mucous membranes are moist.     Pharynx: Oropharynx is  clear. No oropharyngeal exudate or posterior oropharyngeal erythema.  Eyes:     General: No scleral icterus.       Right eye: No discharge.        Left eye: No discharge.     Extraocular Movements: Extraocular movements intact.     Conjunctiva/sclera: Conjunctivae normal.     Pupils: Pupils are equal, round, and reactive to light.  Neck:     Vascular: No carotid bruit.  Cardiovascular:     Rate and Rhythm: Normal rate and regular rhythm.     Pulses: Normal pulses.     Heart sounds: No murmur heard.    No friction rub. No gallop.  Pulmonary:     Effort: Pulmonary effort is normal. No respiratory distress.     Breath sounds: Normal breath sounds. No stridor. No wheezing, rhonchi or rales.  Chest:     Chest wall: No tenderness.  Abdominal:     General: Abdomen is flat. Bowel sounds are normal. There is no distension.     Palpations: Abdomen is soft. There is no mass.     Tenderness: There is no abdominal tenderness. There is no right CVA tenderness, left CVA tenderness, guarding or rebound.     Hernia: No hernia is present.  Genitourinary:    Comments: Genital exam deferred with shared decision making Musculoskeletal:        General: No swelling, tenderness, deformity or signs of injury.     Cervical back: Normal range of motion and neck supple. No rigidity. No muscular tenderness.     Right lower leg: No edema.     Left lower leg: No edema.  Lymphadenopathy:     Cervical: No cervical adenopathy.  Skin:    General: Skin is warm and dry.     Capillary Refill: Capillary refill takes less than 2 seconds.     Coloration: Skin is not jaundiced or pale.     Findings: No bruising, erythema, lesion or rash.  Neurological:     General: No focal deficit  present.     Mental Status: He is alert and oriented to person, place, and time.     Cranial Nerves: No cranial nerve deficit.     Sensory: No sensory deficit.     Motor: No weakness.     Coordination: Coordination normal.     Gait:  Gait normal.     Deep Tendon Reflexes: Reflexes normal.  Psychiatric:        Mood and Affect: Mood normal.        Behavior: Behavior normal.        Thought Content: Thought content normal.        Judgment: Judgment normal.     Results for orders placed or performed in visit on 10/18/22  CBC with Differential/Platelet   Collection Time: 10/18/22  9:46 AM  Result Value Ref Range   WBC 8.0 3.4 - 10.8 x10E3/uL   RBC 5.35 4.14 - 5.80 x10E6/uL   Hemoglobin 15.3 13.0 - 17.7 g/dL   Hematocrit 62.1 30.8 - 51.0 %   MCV 86 79 - 97 fL   MCH 28.6 26.6 - 33.0 pg   MCHC 33.1 31.5 - 35.7 g/dL   RDW 65.7 84.6 - 96.2 %   Platelets 209 150 - 450 x10E3/uL   Neutrophils 41 Not Estab. %   Lymphs 48 Not Estab. %   Monocytes 6 Not Estab. %   Eos 4 Not Estab. %   Basos 1 Not Estab. %   Neutrophils Absolute 3.3 1.4 - 7.0 x10E3/uL   Lymphocytes Absolute 3.9 (H) 0.7 - 3.1 x10E3/uL   Monocytes Absolute 0.5 0.1 - 0.9 x10E3/uL   EOS (ABSOLUTE) 0.3 0.0 - 0.4 x10E3/uL   Basophils Absolute 0.0 0.0 - 0.2 x10E3/uL   Immature Granulocytes 0 Not Estab. %   Immature Grans (Abs) 0.0 0.0 - 0.1 x10E3/uL  Lipid Panel w/o Chol/HDL Ratio   Collection Time: 10/18/22  9:46 AM  Result Value Ref Range   Cholesterol, Total 216 (H) 100 - 199 mg/dL   Triglycerides 952 (H) 0 - 149 mg/dL   HDL 24 (L) >84 mg/dL   VLDL Cholesterol Cal 70 (H) 5 - 40 mg/dL   LDL Chol Calc (NIH) 132 (H) 0 - 99 mg/dL  Comprehensive metabolic panel   Collection Time: 10/18/22  9:46 AM  Result Value Ref Range   Glucose 106 (H) 70 - 99 mg/dL   BUN 17 6 - 24 mg/dL   Creatinine, Ser 4.40 (H) 0.76 - 1.27 mg/dL   eGFR 65 >10 UV/OZD/6.64   BUN/Creatinine Ratio 13 9 - 20   Sodium 141 134 - 144 mmol/L   Potassium 4.5 3.5 - 5.2 mmol/L   Chloride 105 96 - 106 mmol/L   CO2 19 (L) 20 - 29 mmol/L   Calcium  9.8 8.7 - 10.2 mg/dL   Total Protein 6.8 6.0 - 8.5 g/dL   Albumin 4.6 4.1 - 5.1 g/dL   Globulin, Total 2.2 1.5 - 4.5 g/dL   Bilirubin Total 0.2  0.0 - 1.2 mg/dL   Alkaline Phosphatase 40 (L) 44 - 121 IU/L   AST 32 0 - 40 IU/L   ALT 38 0 - 44 IU/L  VITAMIN D  25 Hydroxy (Vit-D Deficiency, Fractures)   Collection Time: 10/18/22  9:46 AM  Result Value Ref Range   Vit D, 25-Hydroxy 57.0 30.0 - 100.0 ng/mL  Uric acid   Collection Time: 10/18/22  9:46 AM  Result Value Ref Range   Uric Acid 5.1 3.8 - 8.4 mg/dL  Valproic Acid  level   Collection Time: 10/18/22  9:46 AM  Result Value Ref Range   Valproic Acid  Lvl 82 50 - 100 ug/mL  Lithium  level   Collection Time: 10/18/22  9:46 AM  Result Value Ref Range   Lithium  Lvl 0.4 (L) 0.5 - 1.2 mmol/L  Lamotrigine  level   Collection Time: 10/18/22  9:46 AM  Result Value Ref Range   Lamotrigine  Lvl 4.4 2.0 - 20.0 ug/mL      Assessment & Plan:   Problem List Items Addressed This Visit       Cardiovascular and Mediastinum   Essential hypertension   Under good control on current regimen. Continue current regimen. Continue to monitor. Call with any concerns. Refills given.  Labs drawn today.       Relevant Medications   lisinopril  (ZESTRIL ) 10 MG tablet   atorvastatin  (LIPITOR) 40 MG tablet   fenofibrate  (TRICOR ) 145 MG tablet   Other Relevant Orders   Microalbumin, Urine Waived     Respiratory   Moderate persistent asthma without complication   Under good control on current regimen. Continue current regimen. Continue to monitor. Call with any concerns. Refills given.  Labs drawn today.       Relevant Medications   albuterol  (VENTOLIN  HFA) 108 (90 Base) MCG/ACT inhaler   Other Relevant Orders   Pneumococcal conjugate vaccine 20-valent (Prevnar 20) (Completed)   OSA (obstructive sleep apnea)   Stable with CPAP. Will start him on zepbound and recheck in 2 months. Call with any concerns.         Musculoskeletal and Integument   Chronic gout of multiple sites   Under good control on current regimen. Continue current regimen. Continue to monitor. Call with any concerns.  Refills given.  Labs drawn today.       Relevant Medications   allopurinol  (ZYLOPRIM ) 300 MG tablet   colchicine  0.6 MG tablet   Other Relevant Orders   Uric acid     Other   Vitamin D  deficiency   Rechecking labs today. Await results. Treat as needed.       Relevant Orders   VITAMIN D  25 Hydroxy (Vit-D Deficiency, Fractures)   Bipolar 2 disorder (HCC)   Continue to follow with psychiatry. Call with any concerns.       Hyperlipidemia   Relevant Medications   lisinopril  (ZESTRIL ) 10 MG tablet   atorvastatin  (LIPITOR) 40 MG tablet   fenofibrate  (TRICOR ) 145 MG tablet   Other Visit Diagnoses       Routine general medical examination at a health care facility    -  Primary   Vaccines up to date. Screening labs cheked today. Colonoscopy up to date. Continue diet and exercise. Call with any concerns.   Relevant Orders   Comprehensive metabolic panel with GFR   CBC with Differential/Platelet   Lipid Panel w/o Chol/HDL Ratio   PSA   TSH   Bayer DCA Hb A1c Waived     Numerous moles       Referral to dermatology placed today.   Relevant Orders   Ambulatory referral to Dermatology        LABORATORY TESTING:  Health maintenance labs ordered today as discussed above.   The natural history of prostate cancer and ongoing controversy regarding screening and potential treatment outcomes of prostate cancer has been discussed with the patient. The meaning of a false positive PSA and a false negative PSA has been discussed. He indicates understanding of the limitations of this screening  test and wishes to proceed with screening PSA testing.   IMMUNIZATIONS:   - Tdap: Tetanus vaccination status reviewed: last tetanus booster within 10 years. - Influenza: Postponed to flu season - Prevnar: Administered today - COVID: Refused - HPV: Not applicable - Shingrix vaccine: Not applicable  SCREENING: - Colonoscopy: Up to date  Discussed with patient purpose of the colonoscopy is to  detect colon cancer at curable precancerous or early stages   PATIENT COUNSELING:    Sexuality: Discussed sexually transmitted diseases, partner selection, use of condoms, avoidance of unintended pregnancy  and contraceptive alternatives.   Advised to avoid cigarette smoking.  I discussed with the patient that most people either abstain from alcohol or drink within safe limits (<=14/week and <=4 drinks/occasion for males, <=7/weeks and <= 3 drinks/occasion for females) and that the risk for alcohol disorders and other health effects rises proportionally with the number of drinks per week and how often a drinker exceeds daily limits.  Discussed cessation/primary prevention of drug use and availability of treatment for abuse.   Diet: Encouraged to adjust caloric intake to maintain  or achieve ideal body weight, to reduce intake of dietary saturated fat and total fat, to limit sodium intake by avoiding high sodium foods and not adding table salt, and to maintain adequate dietary potassium and calcium  preferably from fresh fruits, vegetables, and low-fat dairy products.    stressed the importance of regular exercise  Injury prevention: Discussed safety belts, safety helmets, smoke detector, smoking near bedding or upholstery.   Dental health: Discussed importance of regular tooth brushing, flossing, and dental visits.   Follow up plan: NEXT PREVENTATIVE PHYSICAL DUE IN 1 YEAR. Return in about 2 months (around 09/17/2023) for sleep study records release.

## 2023-07-18 NOTE — Assessment & Plan Note (Signed)
 Under good control on current regimen. Continue current regimen. Continue to monitor. Call with any concerns. Refills given. Labs drawn today.

## 2023-07-18 NOTE — Assessment & Plan Note (Signed)
 Rechecking labs today. Await results. Treat as needed.

## 2023-07-19 ENCOUNTER — Encounter: Payer: Self-pay | Admitting: Family Medicine

## 2023-07-19 LAB — COMPREHENSIVE METABOLIC PANEL WITH GFR
ALT: 39 IU/L (ref 0–44)
AST: 26 IU/L (ref 0–40)
Albumin: 4.8 g/dL (ref 4.1–5.1)
Alkaline Phosphatase: 50 IU/L (ref 44–121)
BUN/Creatinine Ratio: 14 (ref 9–20)
BUN: 15 mg/dL (ref 6–24)
Bilirubin Total: 0.3 mg/dL (ref 0.0–1.2)
CO2: 23 mmol/L (ref 20–29)
Calcium: 9.7 mg/dL (ref 8.7–10.2)
Chloride: 101 mmol/L (ref 96–106)
Creatinine, Ser: 1.07 mg/dL (ref 0.76–1.27)
Globulin, Total: 2.5 g/dL (ref 1.5–4.5)
Glucose: 96 mg/dL (ref 70–99)
Potassium: 4.7 mmol/L (ref 3.5–5.2)
Sodium: 139 mmol/L (ref 134–144)
Total Protein: 7.3 g/dL (ref 6.0–8.5)
eGFR: 86 mL/min/{1.73_m2} (ref >59)

## 2023-07-19 LAB — CBC WITH DIFFERENTIAL/PLATELET
Basophils Absolute: 0.1 10*3/uL (ref 0.0–0.2)
Basos: 1 %
EOS (ABSOLUTE): 0.5 10*3/uL — ABNORMAL HIGH (ref 0.0–0.4)
Eos: 4 %
Hematocrit: 50.8 % (ref 37.5–51.0)
Hemoglobin: 16.4 g/dL (ref 13.0–17.7)
Immature Grans (Abs): 0 10*3/uL (ref 0.0–0.1)
Immature Granulocytes: 0 %
Lymphocytes Absolute: 5.4 10*3/uL — ABNORMAL HIGH (ref 0.7–3.1)
Lymphs: 52 %
MCH: 27.7 pg (ref 26.6–33.0)
MCHC: 32.3 g/dL (ref 31.5–35.7)
MCV: 86 fL (ref 79–97)
Monocytes Absolute: 0.7 10*3/uL (ref 0.1–0.9)
Monocytes: 6 %
Neutrophils Absolute: 3.9 10*3/uL (ref 1.4–7.0)
Neutrophils: 37 %
Platelets: 263 10*3/uL (ref 150–450)
RBC: 5.92 x10E6/uL — ABNORMAL HIGH (ref 4.14–5.80)
RDW: 13 % (ref 11.6–15.4)
WBC: 10.5 10*3/uL (ref 3.4–10.8)

## 2023-07-19 LAB — TSH: TSH: 2.1 u[IU]/mL (ref 0.450–4.500)

## 2023-07-19 LAB — URIC ACID: Uric Acid: 7.1 mg/dL (ref 3.8–8.4)

## 2023-07-19 LAB — LIPID PANEL W/O CHOL/HDL RATIO
Cholesterol, Total: 230 mg/dL — ABNORMAL HIGH (ref 100–199)
HDL: 27 mg/dL — ABNORMAL LOW (ref >39)
LDL Chol Calc (NIH): 145 mg/dL — ABNORMAL HIGH (ref 0–99)
Triglycerides: 317 mg/dL — ABNORMAL HIGH (ref 0–149)
VLDL Cholesterol Cal: 58 mg/dL — ABNORMAL HIGH (ref 5–40)

## 2023-07-19 LAB — VITAMIN D 25 HYDROXY (VIT D DEFICIENCY, FRACTURES): Vit D, 25-Hydroxy: 53.8 ng/mL (ref 30.0–100.0)

## 2023-07-19 LAB — PSA: Prostate Specific Ag, Serum: 0.5 ng/mL (ref 0.0–4.0)

## 2023-07-20 NOTE — Telephone Encounter (Signed)
 Requested medication (s) are due for refill today- no  Requested medication (s) are on the active medication list -yes  Future visit scheduled -yes  Last refill: 07/18/23  Notes to clinic:   Pharmacy comment: Product Backordered/Unavailable:THIS HAS TO BE WRITTEN FOR THE [facility], VIALS ARE NOT AVAILABLE.    Requested Prescriptions  Pending Prescriptions Disp Refills   ZEPBOUND 5 MG/0.5ML injection vial [Pharmacy Med Name: ZEPBOUND 5 MG/0.5 ML VIAL] 2 mL 1    Sig: INJECT 5 MG SUBCUTANEOUSLY WEEKLY     Off-Protocol Failed - 07/20/2023  8:29 AM      Failed - Medication not assigned to a protocol, review manually.      Failed - Valid encounter within last 12 months    Recent Outpatient Visits           2 days ago Routine general medical examination at a health care facility   [facility], [place] P, DO                 Requested Prescriptions  Pending Prescriptions Disp Refills   ZEPBOUND 5 MG/0.5ML injection vial [Pharmacy Med Name: ZEPBOUND 5 MG/0.5 ML VIAL] 2 mL 1    Sig: INJECT 5 MG SUBCUTANEOUSLY WEEKLY     Off-Protocol Failed - 07/20/2023  8:29 AM      Failed - Medication not assigned to a protocol, review manually.      Failed - Valid encounter within last 12 months    Recent Outpatient Visits           2 days ago Routine general medical examination at a health care facility   [facility], Megan P, DO

## 2023-07-21 MED ORDER — TIRZEPATIDE-WEIGHT MANAGEMENT 2.5 MG/0.5ML ~~LOC~~ SOAJ: 2.5000 mg | SUBCUTANEOUS | 0 refills | Status: DC

## 2023-07-21 NOTE — Telephone Encounter (Signed)
 Requested medication (s) are due for refill today: Pharmacy comment: Product Backordered/Unavailable:THIS NEEDS TO BE REWRITTEN FOR THE PENS, VIALS NOT AVAIBLE AT [facility].   Requested medication (s) are on the active medication list: yes  Last refill:  07/18/23  Future visit scheduled: yes  Notes to clinic:  Pharmacy comment: Product Backordered/Unavailable:THIS NEEDS TO BE REWRITTEN FOR THE [facility]ENS, VIALS NOT AVAIBLE AT [facility].      Requested Prescriptions  Pending Prescriptions Disp Refills   WEGOVY 0.25 MG/0.5ML SOAJ [Pharmacy Med Name: WEGOVY 0.25 MG/0.5 ML PEN]  0    Sig: Please specify directions, refills and quantity     Endocrinology:  Diabetes - GLP-1 Receptor Agonists - semaglutide Failed - 07/21/2023  9:19 AM      Failed - Valid encounter within last 6 months    Recent Outpatient Visits           3 days ago Routine general medical examination at a health care facility   [facility], Megan P, DO              Passed - HBA1C in normal range and within 180 days    HB A1C (BAYER DCA - WAIVED)  Date Value Ref Range Status  07/18/2023 5.5 4.8 - 5.6 % Final    Comment:             Prediabetes: 5.7 - 6.4          Diabetes: >6.4          Glycemic control for adults with diabetes: <7.0          Passed - Cr in normal range and within 360 days    Creatinine, Ser  Date Value Ref Range Status  07/18/2023 1.07 0.76 - 1.27 mg/dL Final

## 2023-08-05 ENCOUNTER — Other Ambulatory Visit: Payer: Self-pay | Admitting: Psychiatry

## 2023-08-05 DIAGNOSIS — F411 Generalized anxiety disorder: Secondary | ICD-10-CM

## 2023-08-05 DIAGNOSIS — F422 Mixed obsessional thoughts and acts: Secondary | ICD-10-CM

## 2023-08-08 ENCOUNTER — Ambulatory Visit (INDEPENDENT_AMBULATORY_CARE_PROVIDER_SITE_OTHER): Payer: Self-pay | Admitting: Psychiatry

## 2023-08-08 ENCOUNTER — Ambulatory Visit: Payer: Self-pay | Admitting: Psychiatry

## 2023-08-08 DIAGNOSIS — Z91199 Patient's noncompliance with other medical treatment and regimen due to unspecified reason: Secondary | ICD-10-CM

## 2023-08-08 NOTE — Progress Notes (Signed)
 No show

## 2023-09-10 ENCOUNTER — Other Ambulatory Visit: Payer: Self-pay | Admitting: Psychiatry

## 2023-09-10 DIAGNOSIS — F411 Generalized anxiety disorder: Secondary | ICD-10-CM

## 2023-09-10 DIAGNOSIS — F422 Mixed obsessional thoughts and acts: Secondary | ICD-10-CM

## 2023-09-18 ENCOUNTER — Ambulatory Visit: Admitting: Family Medicine

## 2023-10-07 ENCOUNTER — Other Ambulatory Visit: Payer: Self-pay | Admitting: Psychiatry

## 2023-10-07 DIAGNOSIS — F411 Generalized anxiety disorder: Secondary | ICD-10-CM

## 2023-10-07 DIAGNOSIS — F422 Mixed obsessional thoughts and acts: Secondary | ICD-10-CM

## 2023-10-08 NOTE — Telephone Encounter (Signed)
 Sent MyChart visit to schedule FU.

## 2023-10-12 ENCOUNTER — Other Ambulatory Visit: Payer: Self-pay | Admitting: Psychiatry

## 2023-10-12 ENCOUNTER — Telehealth: Payer: Self-pay | Admitting: Psychiatry

## 2023-10-12 DIAGNOSIS — F3181 Bipolar II disorder: Secondary | ICD-10-CM

## 2023-10-12 NOTE — Telephone Encounter (Signed)
 Pt needs RF on Depakote  ER, Lithobid , Lamictal    [facility] [address]  [place] [address]     Next appt 8/5

## 2023-10-12 NOTE — Telephone Encounter (Signed)
 Lamictal  addressed thru pharmacy interface. LVM to RC to verify Depakote  dose. Note says 2000, but 1500 was sent last time.

## 2023-10-13 ENCOUNTER — Other Ambulatory Visit: Payer: Self-pay | Admitting: Psychiatry

## 2023-10-13 DIAGNOSIS — F3181 Bipolar II disorder: Secondary | ICD-10-CM

## 2023-10-13 MED ORDER — DIVALPROEX SODIUM ER 500 MG PO TB24: 2000.0000 mg | ORAL_TABLET | Freq: Every day | ORAL | 0 refills | Status: DC

## 2023-10-13 MED ORDER — LITHIUM CARBONATE ER 300 MG PO TBCR: EXTENDED_RELEASE_TABLET | ORAL | 0 refills | Status: DC

## 2023-10-13 NOTE — Telephone Encounter (Signed)
All RF have been sent.

## 2023-10-24 ENCOUNTER — Encounter: Payer: Self-pay | Admitting: Psychiatry

## 2023-10-24 ENCOUNTER — Ambulatory Visit (INDEPENDENT_AMBULATORY_CARE_PROVIDER_SITE_OTHER): Admitting: Psychiatry

## 2023-10-24 DIAGNOSIS — F422 Mixed obsessional thoughts and acts: Secondary | ICD-10-CM

## 2023-10-24 DIAGNOSIS — F3181 Bipolar II disorder: Secondary | ICD-10-CM | POA: Diagnosis not present

## 2023-10-24 DIAGNOSIS — G4733 Obstructive sleep apnea (adult) (pediatric): Secondary | ICD-10-CM | POA: Diagnosis not present

## 2023-10-24 DIAGNOSIS — F411 Generalized anxiety disorder: Secondary | ICD-10-CM | POA: Diagnosis not present

## 2023-10-24 MED ORDER — LITHIUM CARBONATE ER 300 MG PO TBCR: EXTENDED_RELEASE_TABLET | ORAL | 0 refills | Status: DC

## 2023-10-24 MED ORDER — LAMOTRIGINE 100 MG PO TABS: 100.0000 mg | ORAL_TABLET | Freq: Every day | ORAL | 0 refills | Status: DC

## 2023-10-24 MED ORDER — FLUVOXAMINE MALEATE 100 MG PO TABS: 200.0000 mg | ORAL_TABLET | Freq: Every day | ORAL | 0 refills | Status: DC
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
## 2023-11-07 ENCOUNTER — Other Ambulatory Visit: Payer: Self-pay | Admitting: Family Medicine

## 2023-11-08 NOTE — Telephone Encounter (Signed)
 Requested Prescriptions  Pending Prescriptions Disp Refills   fenofibrate  (TRICOR ) 145 MG tablet [Pharmacy Med Name: FENOFIBRATE  145 MG TABLET] 90 tablet 1    Sig: TAKE 1 TABLET BY MOUTH EVERY DAY     Cardiovascular:  Antilipid - Fibric Acid Derivatives Failed - 11/08/2023  3:55 PM      Failed - Lipid Panel in normal range within the last 12 months    Cholesterol, Total  Date Value Ref Range Status  07/18/2023 230 (H) 100 - 199 mg/dL Final   LDL Chol Calc (NIH)  Date Value Ref Range Status  07/18/2023 145 (H) 0 - 99 mg/dL Final   HDL  Date Value Ref Range Status  07/18/2023 27 (L) >39 mg/dL Final   Triglycerides  Date Value Ref Range Status  07/18/2023 317 (H) 0 - 149 mg/dL Final         Passed - ALT in normal range and within 360 days    ALT  Date Value Ref Range Status  07/18/2023 39 0 - 44 IU/L Final         Passed - AST in normal range and within 360 days    AST  Date Value Ref Range Status  07/18/2023 26 0 - 40 IU/L Final         Passed - Cr in normal range and within 360 days    Creatinine, Ser  Date Value Ref Range Status  07/18/2023 1.07 0.76 - 1.27 mg/dL Final         Passed - HGB in normal range and within 360 days    Hemoglobin  Date Value Ref Range Status  07/18/2023 16.4 13.0 - 17.7 g/dL Final         Passed - HCT in normal range and within 360 days    Hematocrit  Date Value Ref Range Status  07/18/2023 50.8 37.5 - 51.0 % Final         Passed - PLT in normal range and within 360 days    Platelets  Date Value Ref Range Status  07/18/2023 263 150 - 450 x10E3/uL Final         Passed - WBC in normal range and within 360 days    WBC  Date Value Ref Range Status  07/18/2023 10.5 3.4 - 10.8 x10E3/uL Final         Passed - eGFR is 30 or above and within 360 days    GFR calc Af Amer  Date Value Ref Range Status  12/26/2019 98 >59 mL/min/1.73 Final    Comment:    **[facility] currently reports eGFR in compliance with the current**    recommendations of the [employer]. [facility] will   update reporting as new guidelines are published from the NKF-ASN   Task force.    GFR calc non Af Amer  Date Value Ref Range Status  12/26/2019 85 >59 mL/min/1.73 Final   eGFR  Date Value Ref Range Status  07/18/2023 86 >59 mL/min/1.73 Final         Passed - Valid encounter within last 12 months    Recent Outpatient Visits           3 months ago Routine general medical examination at a health care facility   [facility], Megan P, DO

## 2023-11-28 ENCOUNTER — Ambulatory Visit: Payer: Self-pay

## 2023-11-28 NOTE — Telephone Encounter (Signed)
 FYI Only or Action Required?: FYI only for provider.  Patient was last seen in primary care on 07/18/2023 by Vicci Duwaine SQUIBB, DO.  Called Nurse Triage reporting Cough.  Symptoms began several weeks ago.  Interventions attempted: Other: vicks, humidifier, nebulizer treatments, essential oils.  Symptoms are: unchanged.  Triage Disposition: See Physician Within 24 Hours  Patient/caregiver understands and will follow disposition?: Yes   Copied from CRM [id number]. Topic: Clinical - Red Word Triage >> Nov 28, 2023  8:18 AM Ivette P wrote: Red Word that prompted transfer to Nurse Triage: having worsening, bronchitis. sick a few weeks ago. settled in the chest area. Barking cough, and phlegm coming up. persistent cough. has history of asthma. Reason for Disposition  SEVERE coughing spells (e.g., whooping sound after coughing, vomiting after coughing)  Answer Assessment - Initial Assessment Questions 1. ONSET: When did the cough begin?      Barking cough, hx asthma, started a few weeks ago 2. SEVERITY: How bad is the cough today?      Coughs so much that he ends up winded 3. SPUTUM: Describe the color of your sputum (e.g., none, dry cough; clear, white, yellow, green)     yellow 4. HEMOPTYSIS: Are you coughing up any blood? If Yes, ask: How much? (e.g., flecks, streaks, tablespoons, etc.)     denies 5. DIFFICULTY BREATHING: Are you having difficulty breathing? If Yes, ask: How bad is it? (e.g., mild, moderate, severe)      When he coughs he gets winded 6. FEVER: Do you have a fever? If Yes, ask: What is your temperature, how was it measured, and when did it start?     Low grade 7. CARDIAC HISTORY: Do you have any history of heart disease? (e.g., heart attack, congestive heart failure)      denies 8. LUNG HISTORY: Do you have any history of lung disease?  (e.g., pulmonary embolus, asthma, emphysema)     asthma 9. PE RISK FACTORS: Do you have a history of blood  clots? (or: recent major surgery, recent prolonged travel, bedridden)     denies 10. OTHER SYMPTOMS: Do you have any other symptoms? (e.g., runny nose, wheezing, chest pain)       Runny nose 11. PREGNANCY: Is there any chance you are pregnant? When was your last menstrual period?       na 12. TRAVEL: Have you traveled out of the country in the last month? (e.g., travel history, exposures)       no  Protocols used: Cough - Acute Productive-A-AH

## 2023-11-29 ENCOUNTER — Ambulatory Visit: Admitting: Family Medicine

## 2023-11-29 ENCOUNTER — Encounter: Payer: Self-pay | Admitting: Family Medicine

## 2023-11-29 ENCOUNTER — Telehealth: Payer: Self-pay | Admitting: Family Medicine

## 2023-11-29 VITALS — BP 134/90 | HR 74 | Temp 98.1°F | Ht 70.0 in | Wt 277.2 lb

## 2023-11-29 DIAGNOSIS — R5382 Chronic fatigue, unspecified: Secondary | ICD-10-CM

## 2023-11-29 DIAGNOSIS — J189 Pneumonia, unspecified organism: Secondary | ICD-10-CM | POA: Diagnosis not present

## 2023-11-29 MED ORDER — AMOXICILLIN-POT CLAVULANATE 875-125 MG PO TABS: 1.0000 | ORAL_TABLET | Freq: Two times a day (BID) | ORAL | 0 refills | Status: DC

## 2023-11-29 NOTE — Telephone Encounter (Signed)
 1-2 days, I can provide note if needed

## 2023-11-29 NOTE — Telephone Encounter (Signed)
 Please call patient to advise  Copied from CRM #[id number]. Topic: Clinical - Medical Advice >> Nov 29, 2023 12:01 PM Montie POUR wrote: Reason for CRM:  Rodney Peters needs to know how long he has to stay out of work with pneumonia. Please call Rodney Peters at [phone] to let him know how long. His work is asking.

## 2023-11-29 NOTE — Telephone Encounter (Signed)
 Called and notified patient of providers message.

## 2023-11-29 NOTE — Progress Notes (Signed)
 BP (!) 134/90 (BP Location: Left Arm, Patient Position: Sitting, Cuff Size: Large)   Pulse 74   Temp 98.1 F (36.7 C)   Ht 5' 10 (1.778 m)   Wt 277 lb 3.2 oz (125.7 kg)   SpO2 97%   BMI 39.77 kg/m    Subjective:    Patient ID: Rodney Peters, male    DOB: 1975-07-11, 48 y.o.   MRN: [id number]  HPI: Rodney Peters is a 48 y.o. male  Chief Complaint  Patient presents with   Cough    Pt states he has had a cough for the past week that has been causing aches in chest as well as congestion.    UPPER RESPIRATORY TRACT INFECTION Duration: about 10 days Worst symptom: cough Fever: no Cough: yes Shortness of breath: yes Wheezing: no Chest pain: no Chest tightness: yes Chest congestion: yes Nasal congestion: no Runny nose: yes Post nasal drip: yes Sneezing: no Sore throat: no Swollen glands: no Sinus pressure: yes Headache: yes Face pain: no Toothache: no Ear pain: no  Ear pressure: no  Eyes red/itching:no Eye drainage/crusting: no  Vomiting: no Rash: no Fatigue: yes Sick contacts: yes Strep contacts: no  Context: better Recurrent sinusitis: no Relief with OTC cold/cough medications: no  Treatments attempted: cold and sinus    Relevant past medical, surgical, family and social history reviewed and updated as indicated. Interim medical history since our last visit reviewed. Allergies and medications reviewed and updated.  Review of Systems  Constitutional: Negative.   HENT:  Positive for congestion, postnasal drip and sinus pressure. Negative for dental problem, drooling, ear discharge, ear pain, facial swelling, hearing loss, mouth sores, nosebleeds, rhinorrhea, sinus pain, sneezing, sore throat, tinnitus, trouble swallowing and voice change.   Respiratory:  Positive for cough and shortness of breath. Negative for apnea, choking, chest tightness, wheezing and stridor.   Cardiovascular: Negative.   Gastrointestinal: Negative.   Neurological: Negative.    Psychiatric/Behavioral: Negative.      Per HPI unless specifically indicated above     Objective:    BP (!) 134/90 (BP Location: Left Arm, Patient Position: Sitting, Cuff Size: Large)   Pulse 74   Temp 98.1 F (36.7 C)   Ht 5' 10 (1.778 m)   Wt 277 lb 3.2 oz (125.7 kg)   SpO2 97%   BMI 39.77 kg/m   Wt Readings from Last 3 Encounters:  11/29/23 277 lb 3.2 oz (125.7 kg)  07/18/23 280 lb 3.2 oz (127.1 kg)  10/18/22 278 lb 3.2 oz (126.2 kg)    Physical Exam Vitals and nursing note reviewed.  Constitutional:      General: He is not in acute distress.    Appearance: Normal appearance. He is not ill-appearing, toxic-appearing or diaphoretic.  HENT:     Head: Normocephalic and atraumatic.     Right Ear: External ear normal.     Left Ear: External ear normal.     Nose: Nose normal.     Mouth/Throat:     Mouth: Mucous membranes are moist.     Pharynx: Oropharynx is clear.  Eyes:     General: No scleral icterus.       Right eye: No discharge.        Left eye: No discharge.     Extraocular Movements: Extraocular movements intact.     Conjunctiva/sclera: Conjunctivae normal.     Pupils: Pupils are equal, round, and reactive to light.  Cardiovascular:     Rate and  Rhythm: Normal rate and regular rhythm.     Pulses: Normal pulses.     Heart sounds: Normal heart sounds. No murmur heard.    No friction rub. No gallop.  Pulmonary:     Effort: Pulmonary effort is normal. No respiratory distress.     Breath sounds: No stridor. Rhonchi (LLL) present. No wheezing or rales.  Chest:     Chest wall: No tenderness.  Musculoskeletal:        General: Normal range of motion.     Cervical back: Normal range of motion and neck supple.  Skin:    General: Skin is warm and dry.     Capillary Refill: Capillary refill takes less than 2 seconds.     Coloration: Skin is not jaundiced or pale.     Findings: No bruising, erythema, lesion or rash.  Neurological:     General: No focal deficit  present.     Mental Status: He is alert and oriented to person, place, and time. Mental status is at baseline.  Psychiatric:        Mood and Affect: Mood normal.        Behavior: Behavior normal.        Thought Content: Thought content normal.        Judgment: Judgment normal.     Results for orders placed or performed in visit on 07/18/23  Microalbumin, Urine Waived   Collection Time: 07/18/23 11:11 AM  Result Value Ref Range   Microalb, Ur Waived 10 0 - 19 mg/L   Creatinine, Urine Waived 200 10 - 300 mg/dL   Microalb/Creat Ratio <30 <30 mg/g  Comprehensive metabolic panel with GFR   Collection Time: 07/18/23 11:16 AM  Result Value Ref Range   Glucose 96 70 - 99 mg/dL   BUN 15 6 - 24 mg/dL   Creatinine, Ser 8.92 0.76 - 1.27 mg/dL   eGFR 86 >40 fO/fpw/8.26   BUN/Creatinine Ratio 14 9 - 20   Sodium 139 134 - 144 mmol/L   Potassium 4.7 3.5 - 5.2 mmol/L   Chloride 101 96 - 106 mmol/L   CO2 23 20 - 29 mmol/L   Calcium  9.7 8.7 - 10.2 mg/dL   Total Protein 7.3 6.0 - 8.5 g/dL   Albumin 4.8 4.1 - 5.1 g/dL   Globulin, Total 2.5 1.5 - 4.5 g/dL   Bilirubin Total 0.3 0.0 - 1.2 mg/dL   Alkaline Phosphatase 50 44 - 121 IU/L   AST 26 0 - 40 IU/L   ALT 39 0 - 44 IU/L  CBC with Differential/Platelet   Collection Time: 07/18/23 11:16 AM  Result Value Ref Range   WBC 10.5 3.4 - 10.8 x10E3/uL   RBC 5.92 (H) 4.14 - 5.80 x10E6/uL   Hemoglobin 16.4 13.0 - 17.7 g/dL   Hematocrit 49.1 62.4 - 51.0 %   MCV 86 79 - 97 fL   MCH 27.7 26.6 - 33.0 pg   MCHC 32.3 31.5 - 35.7 g/dL   RDW 86.9 88.3 - 84.5 %   Platelets 263 150 - 450 x10E3/uL   Neutrophils 37 Not Estab. %   Lymphs 52 Not Estab. %   Monocytes 6 Not Estab. %   Eos 4 Not Estab. %   Basos 1 Not Estab. %   Neutrophils Absolute 3.9 1.4 - 7.0 x10E3/uL   Lymphocytes Absolute 5.4 (H) 0.7 - 3.1 x10E3/uL   Monocytes Absolute 0.7 0.1 - 0.9 x10E3/uL   EOS (ABSOLUTE) 0.5 (H) 0.0 - 0.4 x10E3/uL  Basophils Absolute 0.1 0.0 - 0.2 x10E3/uL    Immature Granulocytes 0 Not Estab. %   Immature Grans (Abs) 0.0 0.0 - 0.1 x10E3/uL  Lipid Panel w/o Chol/HDL Ratio   Collection Time: 07/18/23 11:16 AM  Result Value Ref Range   Cholesterol, Total 230 (H) 100 - 199 mg/dL   Triglycerides 682 (H) 0 - 149 mg/dL   HDL 27 (L) >60 mg/dL   VLDL Cholesterol Cal 58 (H) 5 - 40 mg/dL   LDL Chol Calc (NIH) 854 (H) 0 - 99 mg/dL  PSA   Collection Time: 07/18/23 11:16 AM  Result Value Ref Range   Prostate Specific Ag, Serum 0.5 0.0 - 4.0 ng/mL  TSH   Collection Time: 07/18/23 11:16 AM  Result Value Ref Range   TSH 2.100 0.450 - 4.500 uIU/mL  VITAMIN D  25 Hydroxy (Vit-D Deficiency, Fractures)   Collection Time: 07/18/23 11:16 AM  Result Value Ref Range   Vit D, 25-Hydroxy 53.8 30.0 - 100.0 ng/mL  Uric acid   Collection Time: 07/18/23 11:16 AM  Result Value Ref Range   Uric Acid 7.1 3.8 - 8.4 mg/dL  Bayer DCA Hb J8r Waived   Collection Time: 07/18/23 11:36 AM  Result Value Ref Range   HB A1C (BAYER DCA - WAIVED) 5.5 4.8 - 5.6 %      Assessment & Plan:   Problem List Items Addressed This Visit   None Visit Diagnoses       Pneumonia of left lower lobe due to infectious organism    -  Primary   Will treat with augmentin . Recheck 2 weeks. Call with any concerns.   Relevant Medications   amoxicillin -clavulanate (AUGMENTIN ) 875-125 MG tablet     Chronic fatigue       Will get him back for testosterone lab. Await results.   Relevant Orders   Testosterone, free, total([facility])        Follow up plan: Return in about 2 weeks (around 12/13/2023).

## 2023-12-15 ENCOUNTER — Ambulatory Visit: Admitting: Family Medicine

## 2023-12-15 ENCOUNTER — Encounter: Payer: Self-pay | Admitting: Family Medicine

## 2023-12-15 VITALS — BP 128/84 | HR 88 | Ht 70.78 in | Wt 276.8 lb

## 2023-12-15 DIAGNOSIS — Z789 Other specified health status: Secondary | ICD-10-CM

## 2023-12-15 DIAGNOSIS — J189 Pneumonia, unspecified organism: Secondary | ICD-10-CM | POA: Diagnosis not present

## 2023-12-15 DIAGNOSIS — J4540 Moderate persistent asthma, uncomplicated: Secondary | ICD-10-CM | POA: Diagnosis not present

## 2023-12-15 DIAGNOSIS — R5382 Chronic fatigue, unspecified: Secondary | ICD-10-CM

## 2023-12-15 DIAGNOSIS — M1A09X0 Idiopathic chronic gout, multiple sites, without tophus (tophi): Secondary | ICD-10-CM

## 2023-12-15 MED ORDER — ATORVASTATIN CALCIUM 40 MG PO TABS: 40.0000 mg | ORAL_TABLET | Freq: Every day | ORAL | 1 refills | Status: DC

## 2023-12-15 MED ORDER — COLCHICINE 0.6 MG PO TABS: 0.6000 mg | ORAL_TABLET | Freq: Every day | ORAL | 1 refills | Status: AC

## 2023-12-15 MED ORDER — FENOFIBRATE 145 MG PO TABS: 145.0000 mg | ORAL_TABLET | Freq: Every day | ORAL | 1 refills | Status: DC

## 2023-12-15 MED ORDER — OMEPRAZOLE 40 MG PO CPDR: 40.0000 mg | DELAYED_RELEASE_CAPSULE | Freq: Every day | ORAL | 1 refills | Status: DC

## 2023-12-15 MED ORDER — ALBUTEROL SULFATE HFA 108 (90 BASE) MCG/ACT IN AERS
2.0000 | INHALATION_SPRAY | Freq: Four times a day (QID) | RESPIRATORY_TRACT | 5 refills | Status: AC | PRN
Start: 2023-12-15 — End: ?

## 2023-12-15 MED ORDER — ALLOPURINOL 300 MG PO TABS: 300.0000 mg | ORAL_TABLET | Freq: Every day | ORAL | 1 refills | Status: DC

## 2023-12-15 MED ORDER — LISINOPRIL 10 MG PO TABS: 10.0000 mg | ORAL_TABLET | Freq: Every day | ORAL | 1 refills | Status: DC

## 2023-12-15 NOTE — Progress Notes (Signed)
 BP 128/84   Pulse 88   Ht 5' 10.78 (1.798 m)   Wt 276 lb 12.8 oz (125.6 kg)   SpO2 97%   BMI 38.85 kg/m    Subjective:    Patient ID: Rodney Peters, male    DOB: 1976/01/18, 48 y.o.   MRN: [id number]  HPI: Rodney Peters is a 48 y.o. male  Chief Complaint  Patient presents with   lung check    Follow-up    Pneumonia    Feeling better. No fevers. No chills. No SOB. Feeling back to himself.    Relevant past medical, surgical, family and social history reviewed and updated as indicated. Interim medical history since our last visit reviewed. Allergies and medications reviewed and updated.  Review of Systems  Constitutional: Negative.   Respiratory: Negative.    Cardiovascular: Negative.   Musculoskeletal: Negative.   Skin: Negative.   Neurological: Negative.   Psychiatric/Behavioral: Negative.      Per HPI unless specifically indicated above     Objective:    BP 128/84   Pulse 88   Ht 5' 10.78 (1.798 m)   Wt 276 lb 12.8 oz (125.6 kg)   SpO2 97%   BMI 38.85 kg/m   Wt Readings from Last 3 Encounters:  12/15/23 276 lb 12.8 oz (125.6 kg)  11/29/23 277 lb 3.2 oz (125.7 kg)  07/18/23 280 lb 3.2 oz (127.1 kg)    Physical Exam Vitals and nursing note reviewed.  Constitutional:      General: He is not in acute distress.    Appearance: Normal appearance. He is obese. He is not ill-appearing, toxic-appearing or diaphoretic.  HENT:     Head: Normocephalic and atraumatic.     Right Ear: External ear normal.     Left Ear: External ear normal.     Nose: Nose normal.     Mouth/Throat:     Mouth: Mucous membranes are moist.     Pharynx: Oropharynx is clear.  Eyes:     General: No scleral icterus.       Right eye: No discharge.        Left eye: No discharge.     Extraocular Movements: Extraocular movements intact.     Conjunctiva/sclera: Conjunctivae normal.     Pupils: Pupils are equal, round, and reactive to light.  Cardiovascular:     Rate and Rhythm:  Normal rate and regular rhythm.     Pulses: Normal pulses.     Heart sounds: Normal heart sounds. No murmur heard.    No friction rub. No gallop.  Pulmonary:     Effort: Pulmonary effort is normal. No respiratory distress.     Breath sounds: Normal breath sounds. No stridor. No wheezing, rhonchi or rales.  Chest:     Chest wall: No tenderness.  Musculoskeletal:        General: Normal range of motion.     Cervical back: Normal range of motion and neck supple.  Skin:    General: Skin is warm and dry.     Capillary Refill: Capillary refill takes less than 2 seconds.     Coloration: Skin is not jaundiced or pale.     Findings: No bruising, erythema, lesion or rash.  Neurological:     General: No focal deficit present.     Mental Status: He is alert and oriented to person, place, and time. Mental status is at baseline.  Psychiatric:        Mood and Affect:  Mood normal.        Behavior: Behavior normal.        Thought Content: Thought content normal.        Judgment: Judgment normal.     Results for orders placed or performed in visit on 07/18/23  Microalbumin, Urine Waived   Collection Time: 07/18/23 11:11 AM  Result Value Ref Range   Microalb, Ur Waived 10 0 - 19 mg/L   Creatinine, Urine Waived 200 10 - 300 mg/dL   Microalb/Creat Ratio <30 <30 mg/g  Comprehensive metabolic panel with GFR   Collection Time: 07/18/23 11:16 AM  Result Value Ref Range   Glucose 96 70 - 99 mg/dL   BUN 15 6 - 24 mg/dL   Creatinine, Ser 8.92 0.76 - 1.27 mg/dL   eGFR 86 >40 fO/fpw/8.26   BUN/Creatinine Ratio 14 9 - 20   Sodium 139 134 - 144 mmol/L   Potassium 4.7 3.5 - 5.2 mmol/L   Chloride 101 96 - 106 mmol/L   CO2 23 20 - 29 mmol/L   Calcium  9.7 8.7 - 10.2 mg/dL   Total Protein 7.3 6.0 - 8.5 g/dL   Albumin 4.8 4.1 - 5.1 g/dL   Globulin, Total 2.5 1.5 - 4.5 g/dL   Bilirubin Total 0.3 0.0 - 1.2 mg/dL   Alkaline Phosphatase 50 44 - 121 IU/L   AST 26 0 - 40 IU/L   ALT 39 0 - 44 IU/L  CBC with  Differential/Platelet   Collection Time: 07/18/23 11:16 AM  Result Value Ref Range   WBC 10.5 3.4 - 10.8 x10E3/uL   RBC 5.92 (H) 4.14 - 5.80 x10E6/uL   Hemoglobin 16.4 13.0 - 17.7 g/dL   Hematocrit 49.1 62.4 - 51.0 %   MCV 86 79 - 97 fL   MCH 27.7 26.6 - 33.0 pg   MCHC 32.3 31.5 - 35.7 g/dL   RDW 86.9 88.3 - 84.5 %   Platelets 263 150 - 450 x10E3/uL   Neutrophils 37 Not Estab. %   Lymphs 52 Not Estab. %   Monocytes 6 Not Estab. %   Eos 4 Not Estab. %   Basos 1 Not Estab. %   Neutrophils Absolute 3.9 1.4 - 7.0 x10E3/uL   Lymphocytes Absolute 5.4 (H) 0.7 - 3.1 x10E3/uL   Monocytes Absolute 0.7 0.1 - 0.9 x10E3/uL   EOS (ABSOLUTE) 0.5 (H) 0.0 - 0.4 x10E3/uL   Basophils Absolute 0.1 0.0 - 0.2 x10E3/uL   Immature Granulocytes 0 Not Estab. %   Immature Grans (Abs) 0.0 0.0 - 0.1 x10E3/uL  Lipid Panel w/o Chol/HDL Ratio   Collection Time: 07/18/23 11:16 AM  Result Value Ref Range   Cholesterol, Total 230 (H) 100 - 199 mg/dL   Triglycerides 682 (H) 0 - 149 mg/dL   HDL 27 (L) >60 mg/dL   VLDL Cholesterol Cal 58 (H) 5 - 40 mg/dL   LDL Chol Calc (NIH) 854 (H) 0 - 99 mg/dL  PSA   Collection Time: 07/18/23 11:16 AM  Result Value Ref Range   Prostate Specific Ag, Serum 0.5 0.0 - 4.0 ng/mL  TSH   Collection Time: 07/18/23 11:16 AM  Result Value Ref Range   TSH 2.100 0.450 - 4.500 uIU/mL  VITAMIN D  25 Hydroxy (Vit-D Deficiency, Fractures)   Collection Time: 07/18/23 11:16 AM  Result Value Ref Range   Vit D, 25-Hydroxy 53.8 30.0 - 100.0 ng/mL  Uric acid   Collection Time: 07/18/23 11:16 AM  Result Value Ref Range   Uric  Acid 7.1 3.8 - 8.4 mg/dL  Bayer DCA Hb J8r Waived   Collection Time: 07/18/23 11:36 AM  Result Value Ref Range   HB A1C (BAYER DCA - WAIVED) 5.5 4.8 - 5.6 %      Assessment & Plan:   Problem List Items Addressed This Visit   None Visit Diagnoses       Pneumonia of left lower lobe due to infectious organism    -  Primary   Resolved. Lungs clear. Call with any  concerns.     Hepatitis B vaccination status unknown       Labs drawn today. Await results.   Relevant Orders   Hepatitis B surface antibody,quantitative     Chronic fatigue       Will get him back for testosterone lab. Await results.        Follow up plan: Return in about 6 months (around 06/13/2024).

## 2023-12-19 LAB — TESTOSTERONE, FREE, TOTAL, SHBG
Sex Hormone Binding: 19.5 nmol/L (ref 16.5–55.9)
Testosterone, Free: 8 pg/mL (ref 6.8–21.5)
Testosterone: 278 ng/dL (ref 264–916)

## 2023-12-19 LAB — HEPATITIS B SURFACE ANTIBODY, QUANTITATIVE: Hepatitis B Surf Ab Quant: <3.5 m[IU]/mL — ABNORMAL LOW

## 2023-12-21 ENCOUNTER — Ambulatory Visit: Payer: Self-pay | Admitting: Family Medicine

## 2023-12-24 ENCOUNTER — Other Ambulatory Visit: Payer: Self-pay | Admitting: Psychiatry

## 2023-12-24 DIAGNOSIS — F3181 Bipolar II disorder: Secondary | ICD-10-CM

## 2023-12-25 NOTE — Telephone Encounter (Signed)
 Verify dose  Continue Depakote  to 1500 mg HS.

## 2023-12-26 ENCOUNTER — Other Ambulatory Visit: Payer: Self-pay | Admitting: Psychiatry

## 2023-12-26 DIAGNOSIS — F3181 Bipolar II disorder: Secondary | ICD-10-CM

## 2023-12-26 MED ORDER — DIVALPROEX SODIUM ER 500 MG PO TB24: 2000.0000 mg | ORAL_TABLET | Freq: Every day | ORAL | 0 refills | Status: DC

## 2023-12-26 NOTE — Telephone Encounter (Signed)
 I sent another rx

## 2024-01-24 ENCOUNTER — Ambulatory Visit: Admitting: Psychiatry

## 2024-02-02 ENCOUNTER — Other Ambulatory Visit: Payer: Self-pay | Admitting: Psychiatry

## 2024-02-02 DIAGNOSIS — F422 Mixed obsessional thoughts and acts: Secondary | ICD-10-CM

## 2024-02-02 DIAGNOSIS — F411 Generalized anxiety disorder: Secondary | ICD-10-CM

## 2024-03-20 ENCOUNTER — Encounter: Payer: Self-pay | Admitting: Psychiatry

## 2024-03-20 ENCOUNTER — Ambulatory Visit (INDEPENDENT_AMBULATORY_CARE_PROVIDER_SITE_OTHER): Admitting: Psychiatry

## 2024-03-20 DIAGNOSIS — F422 Mixed obsessional thoughts and acts: Secondary | ICD-10-CM

## 2024-03-20 DIAGNOSIS — G4733 Obstructive sleep apnea (adult) (pediatric): Secondary | ICD-10-CM

## 2024-03-20 DIAGNOSIS — F3181 Bipolar II disorder: Secondary | ICD-10-CM | POA: Diagnosis not present

## 2024-03-20 DIAGNOSIS — F411 Generalized anxiety disorder: Secondary | ICD-10-CM

## 2024-03-20 MED ORDER — DIVALPROEX SODIUM ER 500 MG PO TB24: 2000.0000 mg | ORAL_TABLET | Freq: Every day | ORAL | 0 refills | Status: AC

## 2024-03-20 MED ORDER — FLUVOXAMINE MALEATE 100 MG PO TABS: ORAL_TABLET | ORAL | 0 refills | Status: AC

## 2024-03-20 MED ORDER — LITHIUM CARBONATE ER 300 MG PO TBCR: EXTENDED_RELEASE_TABLET | ORAL | 0 refills | Status: AC

## 2024-03-20 MED ORDER — LAMOTRIGINE 100 MG PO TABS: 100.0000 mg | ORAL_TABLET | Freq: Every day | ORAL | 0 refills | Status: AC
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
## 2024-04-05 ENCOUNTER — Encounter: Payer: Self-pay | Admitting: Family Medicine

## 2024-04-05 ENCOUNTER — Ambulatory Visit: Admitting: Family Medicine

## 2024-04-05 VITALS — BP 120/75 | HR 71 | Temp 98.1°F | Ht 70.5 in | Wt 284.2 lb

## 2024-04-05 DIAGNOSIS — E66813 Obesity, class 3: Secondary | ICD-10-CM | POA: Diagnosis not present

## 2024-04-05 DIAGNOSIS — M1A09X0 Idiopathic chronic gout, multiple sites, without tophus (tophi): Secondary | ICD-10-CM | POA: Diagnosis not present

## 2024-04-05 DIAGNOSIS — G4733 Obstructive sleep apnea (adult) (pediatric): Secondary | ICD-10-CM

## 2024-04-05 DIAGNOSIS — E785 Hyperlipidemia, unspecified: Secondary | ICD-10-CM | POA: Diagnosis not present

## 2024-04-05 DIAGNOSIS — I10 Essential (primary) hypertension: Secondary | ICD-10-CM

## 2024-04-05 DIAGNOSIS — Z6841 Body Mass Index (BMI) 40.0 and over, adult: Secondary | ICD-10-CM | POA: Diagnosis not present

## 2024-04-05 MED ORDER — ATORVASTATIN CALCIUM 40 MG PO TABS: 40.0000 mg | ORAL_TABLET | Freq: Every day | ORAL | 0 refills | Status: AC

## 2024-04-05 MED ORDER — ALLOPURINOL 300 MG PO TABS: 300.0000 mg | ORAL_TABLET | Freq: Every day | ORAL | 0 refills | Status: AC

## 2024-04-05 MED ORDER — OMEPRAZOLE 40 MG PO CPDR: 40.0000 mg | DELAYED_RELEASE_CAPSULE | Freq: Every day | ORAL | 0 refills | Status: AC

## 2024-04-05 MED ORDER — LISINOPRIL 10 MG PO TABS: 10.0000 mg | ORAL_TABLET | Freq: Every day | ORAL | 0 refills | Status: AC

## 2024-04-05 MED ORDER — FENOFIBRATE 145 MG PO TABS: 145.0000 mg | ORAL_TABLET | Freq: Every day | ORAL | 0 refills | Status: AC

## 2024-04-05 NOTE — Progress Notes (Signed)
 "  BP 120/75   Pulse 71   Temp 98.1 F (36.7 C) (Oral)   Ht 5' 10.5 (1.791 m)   Wt 284 lb 3.2 oz (128.9 kg)   SpO2 98%   BMI 40.20 kg/m    Subjective:    Patient ID: Rodney Peters, male    DOB: [birth year], 49 y.o.   MRN: [id number]  HPI: Rodney Peters is a 49 y.o. male  Chief Complaint  Patient presents with   Obesity   Sleep Apnea   Hypertension   SLEEP APNEA Sleep apnea status: stable Duration: chronic Satisfied with current treatment?:  unsure CPAP use:  yes Sleep quality with CPAP use: good Treament compliance:excellent compliance Last sleep study: >5 years ago Treatments attempted: CPAP Wakes feeling refreshed:  no Daytime hypersomnolence:  yes Fatigue:  yes Insomnia:  no Good sleep hygiene:  yes Difficulty falling asleep:  no Difficulty staying asleep:  no Snoring bothers bed partner:  yes Observed apnea by bed partner: yes Obesity:  yes Hypertension: yes  Pulmonary hypertension:  no Coronary artery disease:  no  OBESITY Duration: chronic Previous attempts at weight loss: yes- diet, exercise Complications of obesity: OSA, depression, gout, GERD, HTN, HLD Peak weight: 284 (current) Weight loss goal: to be healthy Weight loss to date: none Requesting obesity pharmacotherapy: yes Current weight loss supplements/medications: no Previous weight loss supplements/meds: no  Gout- no flares, tolerating his medicine well  HYPERTENSION / HYPERLIPIDEMIA Satisfied with current treatment? yes Duration of hypertension: chronic BP monitoring frequency: not checking BP medication side effects: no Past BP meds: lisinopril  Duration of hyperlipidemia: chronic Cholesterol medication side effects: no Cholesterol supplements: none Past cholesterol medications: atorvastatin  Medication compliance: excellent compliance Aspirin: no Recent stressors: no Recurrent headaches: no Visual changes: no Palpitations: no Dyspnea: no Chest pain: no Lower extremity  edema: no Dizzy/lightheaded: no    Relevant past medical, surgical, family and social history reviewed and updated as indicated. Interim medical history since our last visit reviewed. Allergies and medications reviewed and updated.  Review of Systems  Constitutional: Negative.   Respiratory: Negative.    Cardiovascular: Negative.   Gastrointestinal: Negative.   Musculoskeletal: Negative.   Neurological: Negative.   Psychiatric/Behavioral: Negative.      Per HPI unless specifically indicated above     Objective:    BP 120/75   Pulse 71   Temp 98.1 F (36.7 C) (Oral)   Ht 5' 10.5 (1.791 m)   Wt 284 lb 3.2 oz (128.9 kg)   SpO2 98%   BMI 40.20 kg/m   Wt Readings from Last 3 Encounters:  04/05/24 284 lb 3.2 oz (128.9 kg)  12/15/23 276 lb 12.8 oz (125.6 kg)  11/29/23 277 lb 3.2 oz (125.7 kg)    Physical Exam Vitals and nursing note reviewed.  Constitutional:      General: He is not in acute distress.    Appearance: Normal appearance. He is not ill-appearing, toxic-appearing or diaphoretic.  HENT:     Head: Normocephalic and atraumatic.     Right Ear: External ear normal.     Left Ear: External ear normal.     Nose: Nose normal.     Mouth/Throat:     Mouth: Mucous membranes are moist.     Pharynx: Oropharynx is clear.  Eyes:     General: No scleral icterus.       Right eye: No discharge.        Left eye: No discharge.     Extraocular Movements:  Extraocular movements intact.     Conjunctiva/sclera: Conjunctivae normal.     Pupils: Pupils are equal, round, and reactive to light.  Cardiovascular:     Rate and Rhythm: Normal rate and regular rhythm.     Pulses: Normal pulses.     Heart sounds: Normal heart sounds. No murmur heard.    No friction rub. No gallop.  Pulmonary:     Effort: Pulmonary effort is normal. No respiratory distress.     Breath sounds: Normal breath sounds. No stridor. No wheezing, rhonchi or rales.  Chest:     Chest wall: No tenderness.   Musculoskeletal:        General: Normal range of motion.     Cervical back: Normal range of motion and neck supple.  Skin:    General: Skin is warm and dry.     Capillary Refill: Capillary refill takes less than 2 seconds.     Coloration: Skin is not jaundiced or pale.     Findings: No bruising, erythema, lesion or rash.  Neurological:     General: No focal deficit present.     Mental Status: He is alert and oriented to person, place, and time. Mental status is at baseline.  Psychiatric:        Mood and Affect: Mood normal.        Behavior: Behavior normal.        Thought Content: Thought content normal.        Judgment: Judgment normal.     Results for orders placed or performed in visit on 12/15/23  Hepatitis B surface antibody,quantitative   Collection Time: 12/15/23 10:38 AM  Result Value Ref Range   Hepatitis B Surf Ab Quant <3.5 (L) Immunity>10 mIU/mL  Testosterone , free, total([facility])   Collection Time: 12/15/23 10:38 AM  Result Value Ref Range   Testosterone  278 264 - 916 ng/dL   Testosterone , Free 8.0 6.8 - 21.5 pg/mL   Sex Hormone Binding 19.5 16.5 - 55.9 nmol/L      Assessment & Plan:   Problem List Items Addressed This Visit       Cardiovascular and Mediastinum   Essential hypertension   Under good control on current regimen. Continue current regimen. Continue to monitor. Call with any concerns. Refills given. Labs drawn today.        Relevant Medications   atorvastatin  (LIPITOR) 40 MG tablet   fenofibrate  (TRICOR ) 145 MG tablet   lisinopril  (ZESTRIL ) 10 MG tablet   Other Relevant Orders   CBC with Differential/Platelet   Comprehensive metabolic panel with GFR     Respiratory   OSA (obstructive sleep apnea) - Primary   Hasn't had a sleep study in several years- will repeat sleep study. Await results. Would benefit from GLP-1 medicine. Will check on coverage of zepbound . If not covered will send in oral wegovy. Recheck 6 weeks. Call with any  concerns.       Relevant Orders   Ambulatory referral to Sleep Studies     Musculoskeletal and Integument   Chronic gout of multiple sites   Under good control on current regimen. Continue current regimen. Continue to monitor. Call with any concerns. Refills given. Labs drawn today.       Relevant Medications   allopurinol  (ZYLOPRIM ) 300 MG tablet   Other Relevant Orders   Uric acid     Other   Obesity   Would benefit from GLP-1, will check on coverage for OSA and if not covered will start oral wegovy.  Recheck 6 weeks.       Hyperlipidemia   Under good control on current regimen. Continue current regimen. Continue to monitor. Call with any concerns. Refills given. Labs drawn today.       Relevant Medications   atorvastatin  (LIPITOR) 40 MG tablet   fenofibrate  (TRICOR ) 145 MG tablet   lisinopril  (ZESTRIL ) 10 MG tablet   Other Relevant Orders   CBC with Differential/Platelet   Comprehensive metabolic panel with GFR   Lipid Panel w/o Chol/HDL Ratio     Follow up plan: Return in about 6 weeks (around 05/17/2024).      "

## 2024-04-05 NOTE — Assessment & Plan Note (Signed)
 Under good control on current regimen. Continue current regimen. Continue to monitor. Call with any concerns. Refills given. Labs drawn today.

## 2024-04-05 NOTE — Assessment & Plan Note (Signed)
 Would benefit from GLP-1, will check on coverage for OSA and if not covered will start oral wegovy. Recheck 6 weeks.

## 2024-04-05 NOTE — Assessment & Plan Note (Signed)
 Hasn't had a sleep study in several years- will repeat sleep study. Await results. Would benefit from GLP-1 medicine. Will check on coverage of zepbound . If not covered will send in oral wegovy. Recheck 6 weeks. Call with any concerns.

## 2024-04-06 LAB — LIPID PANEL W/O CHOL/HDL RATIO
Cholesterol, Total: 216 mg/dL — ABNORMAL HIGH (ref 100–199)
HDL: 28 mg/dL — ABNORMAL LOW
LDL Chol Calc (NIH): 137 mg/dL — ABNORMAL HIGH (ref 0–99)
Triglycerides: 280 mg/dL — ABNORMAL HIGH (ref 0–149)
VLDL Cholesterol Cal: 51 mg/dL — ABNORMAL HIGH (ref 5–40)

## 2024-04-06 LAB — URIC ACID: Uric Acid: 5.3 mg/dL (ref 3.8–8.4)

## 2024-04-06 LAB — CBC WITH DIFFERENTIAL/PLATELET
Basophils Absolute: 0 x10E3/uL (ref 0.0–0.2)
Basos: 0 %
EOS (ABSOLUTE): 0.4 x10E3/uL (ref 0.0–0.4)
Eos: 4 %
Hematocrit: 50 % (ref 37.5–51.0)
Hemoglobin: 15.7 g/dL (ref 13.0–17.7)
Immature Grans (Abs): 0 x10E3/uL (ref 0.0–0.1)
Immature Granulocytes: 0 %
Lymphocytes Absolute: 5.3 x10E3/uL — ABNORMAL HIGH (ref 0.7–3.1)
Lymphs: 55 %
MCH: 27.6 pg (ref 26.6–33.0)
MCHC: 31.4 g/dL — ABNORMAL LOW (ref 31.5–35.7)
MCV: 88 fL (ref 79–97)
Monocytes Absolute: 0.7 x10E3/uL (ref 0.1–0.9)
Monocytes: 7 %
Neutrophils Absolute: 3.3 x10E3/uL (ref 1.4–7.0)
Neutrophils: 34 %
Platelets: 228 x10E3/uL (ref 150–450)
RBC: 5.69 x10E6/uL (ref 4.14–5.80)
RDW: 13.2 % (ref 11.6–15.4)
WBC: 9.7 x10E3/uL (ref 3.4–10.8)

## 2024-04-06 LAB — COMPREHENSIVE METABOLIC PANEL WITH GFR
ALT: 28 IU/L (ref 0–44)
AST: 20 IU/L (ref 0–40)
Albumin: 4.6 g/dL (ref 4.1–5.1)
Alkaline Phosphatase: 40 IU/L — ABNORMAL LOW (ref 47–123)
BUN/Creatinine Ratio: 10 (ref 9–20)
BUN: 13 mg/dL (ref 6–24)
Bilirubin Total: 0.3 mg/dL (ref 0.0–1.2)
CO2: 22 mmol/L (ref 20–29)
Calcium: 9.5 mg/dL (ref 8.7–10.2)
Chloride: 105 mmol/L (ref 96–106)
Creatinine, Ser: 1.29 mg/dL — ABNORMAL HIGH (ref 0.76–1.27)
Globulin, Total: 2.1 g/dL (ref 1.5–4.5)
Glucose: 89 mg/dL (ref 70–99)
Potassium: 4.9 mmol/L (ref 3.5–5.2)
Sodium: 142 mmol/L (ref 134–144)
Total Protein: 6.7 g/dL (ref 6.0–8.5)
eGFR: 69 mL/min/1.73

## 2024-04-07 ENCOUNTER — Ambulatory Visit: Payer: Self-pay | Admitting: Family Medicine

## 2024-04-24 ENCOUNTER — Ambulatory Visit: Admitting: Family Medicine

## 2024-04-25 ENCOUNTER — Telehealth: Payer: Self-pay

## 2024-04-25 ENCOUNTER — Other Ambulatory Visit (HOSPITAL_COMMUNITY): Payer: Self-pay

## 2024-04-25 NOTE — Telephone Encounter (Signed)
 Pharmacy Patient Advocate Encounter   Received notification from Physician's Office that prior authorization for Zepbound  2.5mg /0.28ml is required/requested.   Insurance verification completed.   The patient is insured through [facility].   Per test claim: The current 28 day co-pay is, $1,030.63.  No PA needed at this time. This test claim was processed through [facility]- copay amounts may vary at other pharmacies due to pharmacy/plan contracts, or as the patient moves through the different stages of their insurance plan.     There is an option of getting Zepbound  through [employer] and it starts as low as $299 for the 2.5mg , as low as $399 for the 5mg  and as low as $449 for the 7.5mg  and up strengths.

## 2024-05-24 ENCOUNTER — Ambulatory Visit: Admitting: Family Medicine

## 2024-06-13 ENCOUNTER — Ambulatory Visit: Admitting: Family Medicine

## 2024-06-18 ENCOUNTER — Ambulatory Visit: Admitting: Psychiatry
